# Patient Record
Sex: Male | Born: 1940 | Race: White | Hispanic: No | Marital: Married | Smoking: Never smoker
Health system: Southern US, Community
[De-identification: ages and names within clinical notes are randomized; demographics above are authoritative.]

## PROBLEM LIST (undated history)

## (undated) DIAGNOSIS — C801 Malignant (primary) neoplasm, unspecified: Secondary | ICD-10-CM

## (undated) DIAGNOSIS — M1990 Unspecified osteoarthritis, unspecified site: Secondary | ICD-10-CM

## (undated) DIAGNOSIS — R002 Palpitations: Secondary | ICD-10-CM

## (undated) DIAGNOSIS — K859 Acute pancreatitis without necrosis or infection, unspecified: Secondary | ICD-10-CM

## (undated) DIAGNOSIS — I10 Essential (primary) hypertension: Secondary | ICD-10-CM

## (undated) DIAGNOSIS — J189 Pneumonia, unspecified organism: Secondary | ICD-10-CM

## (undated) HISTORY — PX: SALIVARY GLAND SURGERY: SHX768

## (undated) HISTORY — PX: TONSILLECTOMY: SUR1361

## (undated) HISTORY — DX: Essential (primary) hypertension: I10

## (undated) HISTORY — DX: Acute pancreatitis without necrosis or infection, unspecified: K85.90

## (undated) HISTORY — PX: KNEE SURGERY: SHX244

## (undated) HISTORY — DX: Palpitations: R00.2

---
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
## 2002-03-23 ENCOUNTER — Emergency Department (HOSPITAL_COMMUNITY): Admission: EM | Admit: 2002-03-23 | Discharge: 2002-03-23 | Payer: Self-pay | Admitting: Emergency Medicine

## 2002-03-23 ENCOUNTER — Encounter: Payer: Self-pay | Admitting: Emergency Medicine

## 2002-08-11 ENCOUNTER — Encounter: Payer: Self-pay | Admitting: Internal Medicine

## 2003-05-04 ENCOUNTER — Encounter: Payer: Self-pay | Admitting: Internal Medicine

## 2003-05-04 ENCOUNTER — Ambulatory Visit (HOSPITAL_COMMUNITY): Admission: RE | Admit: 2003-05-04 | Discharge: 2003-05-04 | Payer: Self-pay | Admitting: Gastroenterology

## 2003-05-04 ENCOUNTER — Encounter (INDEPENDENT_AMBULATORY_CARE_PROVIDER_SITE_OTHER): Payer: Self-pay | Admitting: *Deleted

## 2004-10-20 ENCOUNTER — Ambulatory Visit: Payer: Self-pay | Admitting: Internal Medicine

## 2005-01-25 ENCOUNTER — Emergency Department (HOSPITAL_COMMUNITY): Admission: EM | Admit: 2005-01-25 | Discharge: 2005-01-25 | Payer: Self-pay | Admitting: Emergency Medicine

## 2005-01-27 ENCOUNTER — Ambulatory Visit: Payer: Self-pay | Admitting: Internal Medicine

## 2005-03-10 ENCOUNTER — Ambulatory Visit: Payer: Self-pay | Admitting: Internal Medicine

## 2005-04-15 ENCOUNTER — Ambulatory Visit: Payer: Self-pay | Admitting: Internal Medicine

## 2005-04-16 ENCOUNTER — Ambulatory Visit: Payer: Self-pay | Admitting: Cardiology

## 2005-09-07 ENCOUNTER — Ambulatory Visit: Payer: Self-pay | Admitting: Internal Medicine

## 2005-09-15 ENCOUNTER — Ambulatory Visit: Payer: Self-pay | Admitting: Internal Medicine

## 2005-09-23 ENCOUNTER — Ambulatory Visit: Payer: Self-pay | Admitting: Internal Medicine

## 2005-10-09 ENCOUNTER — Ambulatory Visit: Payer: Self-pay | Admitting: Internal Medicine

## 2005-10-23 ENCOUNTER — Ambulatory Visit: Payer: Self-pay | Admitting: Internal Medicine

## 2006-02-22 ENCOUNTER — Ambulatory Visit: Payer: Self-pay | Admitting: Internal Medicine

## 2006-07-05 ENCOUNTER — Ambulatory Visit: Payer: Self-pay | Admitting: Internal Medicine

## 2006-07-06 ENCOUNTER — Encounter: Admission: RE | Admit: 2006-07-06 | Discharge: 2006-07-06 | Payer: Self-pay | Admitting: Internal Medicine

## 2006-08-25 ENCOUNTER — Ambulatory Visit: Payer: Self-pay | Admitting: Internal Medicine

## 2006-09-08 ENCOUNTER — Encounter: Admission: RE | Admit: 2006-09-08 | Discharge: 2006-09-08 | Payer: Self-pay | Admitting: Internal Medicine

## 2006-10-18 ENCOUNTER — Encounter: Admission: RE | Admit: 2006-10-18 | Discharge: 2006-11-12 | Payer: Self-pay | Admitting: Internal Medicine

## 2006-10-18 ENCOUNTER — Encounter: Payer: Self-pay | Admitting: Internal Medicine

## 2006-11-08 ENCOUNTER — Ambulatory Visit: Payer: Self-pay | Admitting: Internal Medicine

## 2006-11-11 ENCOUNTER — Ambulatory Visit: Payer: Self-pay | Admitting: Internal Medicine

## 2006-11-11 LAB — CONVERTED CEMR LAB
ALT: 31 units/L (ref 0–40)
AST: 20 units/L (ref 0–37)
Albumin: 3.8 g/dL (ref 3.5–5.2)
Alkaline Phosphatase: 48 units/L (ref 39–117)
BUN: 17 mg/dL (ref 6–23)
Basophils Absolute: 0.1 10*3/uL (ref 0.0–0.1)
Basophils Relative: 0.9 % (ref 0.0–1.0)
CO2: 31 meq/L (ref 19–32)
Calcium: 9.3 mg/dL (ref 8.4–10.5)
Chloride: 102 meq/L (ref 96–112)
Chol/HDL Ratio, serum: 3.9
Cholesterol: 194 mg/dL (ref 0–200)
Creatinine, Ser: 1 mg/dL (ref 0.4–1.5)
Eosinophil percent: 2.7 % (ref 0.0–5.0)
GFR calc non Af Amer: 80 mL/min
Glomerular Filtration Rate, Af Am: 97 mL/min/{1.73_m2}
Glucose, Bld: 108 mg/dL — ABNORMAL HIGH (ref 70–99)
HCT: 43.8 % (ref 39.0–52.0)
HDL: 50.3 mg/dL (ref >39.0)
Hemoglobin: 15.3 g/dL (ref 13.0–17.0)
LDL Cholesterol: 127 mg/dL — ABNORMAL HIGH (ref 0–99)
Lymphocytes Relative: 25.3 % (ref 12.0–46.0)
MCHC: 35.1 g/dL (ref 30.0–36.0)
MCV: 87.4 fL (ref 78.0–100.0)
Monocytes Absolute: 0.6 10*3/uL (ref 0.2–0.7)
Monocytes Relative: 8.5 % (ref 3.0–11.0)
Neutro Abs: 4.6 10*3/uL (ref 1.4–7.7)
Neutrophils Relative %: 62.6 % (ref 43.0–77.0)
PSA: 1.11 ng/mL (ref 0.10–4.00)
Platelets: 264 10*3/uL (ref 150–400)
Potassium: 4 meq/L (ref 3.5–5.1)
RBC: 5.01 M/uL (ref 4.22–5.81)
RDW: 12.6 % (ref 11.5–14.6)
Sodium: 139 meq/L (ref 135–145)
TSH: 2.03 microintl units/mL (ref 0.35–5.50)
Total Bilirubin: 0.8 mg/dL (ref 0.3–1.2)
Total Protein: 6.6 g/dL (ref 6.0–8.3)
Triglyceride fasting, serum: 82 mg/dL (ref 0–149)
VLDL: 16 mg/dL (ref 0–40)
WBC: 7.3 10*3/uL (ref 4.5–10.5)

## 2006-11-18 ENCOUNTER — Ambulatory Visit: Payer: Self-pay | Admitting: Internal Medicine

## 2007-12-05 ENCOUNTER — Encounter: Payer: Self-pay | Admitting: Internal Medicine

## 2007-12-06 ENCOUNTER — Telehealth: Payer: Self-pay | Admitting: Internal Medicine

## 2009-12-04 ENCOUNTER — Ambulatory Visit: Payer: Self-pay | Admitting: Internal Medicine

## 2009-12-04 DIAGNOSIS — E119 Type 2 diabetes mellitus without complications: Secondary | ICD-10-CM

## 2009-12-04 DIAGNOSIS — I10 Essential (primary) hypertension: Secondary | ICD-10-CM | POA: Insufficient documentation

## 2010-03-25 ENCOUNTER — Encounter: Payer: Self-pay | Admitting: Internal Medicine

## 2010-06-07 ENCOUNTER — Emergency Department (HOSPITAL_COMMUNITY): Admission: EM | Admit: 2010-06-07 | Discharge: 2010-06-07 | Payer: Self-pay | Admitting: Emergency Medicine

## 2010-06-07 ENCOUNTER — Emergency Department (HOSPITAL_COMMUNITY): Admission: EM | Admit: 2010-06-07 | Discharge: 2010-06-07 | Payer: Self-pay | Admitting: Family Medicine

## 2010-06-10 ENCOUNTER — Ambulatory Visit: Payer: Self-pay | Admitting: Internal Medicine

## 2010-06-10 DIAGNOSIS — R002 Palpitations: Secondary | ICD-10-CM | POA: Insufficient documentation

## 2010-06-10 HISTORY — DX: Palpitations: R00.2

## 2010-06-11 ENCOUNTER — Telehealth: Payer: Self-pay | Admitting: Internal Medicine

## 2010-06-20 ENCOUNTER — Ambulatory Visit: Payer: Self-pay | Admitting: Cardiology

## 2010-06-20 ENCOUNTER — Ambulatory Visit: Payer: Self-pay | Admitting: Internal Medicine

## 2010-06-20 ENCOUNTER — Telehealth: Payer: Self-pay | Admitting: *Deleted

## 2010-06-20 ENCOUNTER — Ambulatory Visit: Payer: Self-pay

## 2010-06-20 ENCOUNTER — Ambulatory Visit (HOSPITAL_COMMUNITY): Admission: RE | Admit: 2010-06-20 | Discharge: 2010-06-20 | Payer: Self-pay | Admitting: Internal Medicine

## 2010-06-20 ENCOUNTER — Encounter: Payer: Self-pay | Admitting: Internal Medicine

## 2010-06-30 ENCOUNTER — Encounter: Payer: Self-pay | Admitting: Internal Medicine

## 2010-09-12 ENCOUNTER — Encounter: Payer: Self-pay | Admitting: Internal Medicine

## 2010-12-07 LAB — CONVERTED CEMR LAB
Bilirubin, Direct: 0 mg/dL (ref 0.0–0.3)
Calcium: 9.3 mg/dL (ref 8.4–10.5)
Creatinine, Ser: 0.9 mg/dL (ref 0.4–1.5)
HDL: 56.4 mg/dL (ref >39.00)
Hgb A1c MFr Bld: 6.3 % (ref 4.6–6.5)
LDL Cholesterol: 100 mg/dL — ABNORMAL HIGH (ref 0–99)
PSA: 1.33 ng/mL (ref 0.10–4.00)
Total Bilirubin: 0.7 mg/dL (ref 0.3–1.2)
Total CHOL/HDL Ratio: 3
Total Protein: 6.7 g/dL (ref 6.0–8.3)
Triglycerides: 100 mg/dL (ref 0.0–149.0)

## 2010-12-11 NOTE — Procedures (Signed)
Summary: [facility]  [facility]   Imported By: Maryln Gottron 12/05/2009 15:20:00  _____________________________________________________________________  External Attachment:    Type:   Image     Comment:   External Document

## 2010-12-11 NOTE — Miscellaneous (Signed)
Summary: Immunization Entry   Immunization History:  Influenza Immunization History:    Influenza:  historical (09/10/2010)

## 2010-12-11 NOTE — Assessment & Plan Note (Signed)
Summary: PT WILL COME IN FASTING (CPX) // RS/pt rsc/cjr   Vital Signs:  Patient profile:   70 year old male Height:      68 inches Weight:      209 pounds BMI:     31.89 Pulse rate:   72 / minute Resp:     12 per minute BP sitting:   112 / 68  (left arm)  Vitals Entered By: Gladis Riffle, RN (December 04, 2009 8:06 AM)   History of Present Illness: Here for annual medicare visit  in additiion he has multiple medical problems  Htn---tolerating meds without difficulty  DM---relatively new diagnosis. initially started with weight loss---has gained weight since  Preventive Screening-Counseling & Management  Alcohol-Tobacco     Smoking Status: never  Current Medications (verified): 1)  Aspirin Ec 81 Mg Tbec (Aspirin) .... Take 1 Tablet By Mouth Once A Day 2)  Ativan 0.5 Mg Tabs (Lorazepam) .... Take 1 Tablet By Mouth Once A Day As Needed 3)  Benazepril-Hydrochlorothiazide 20-25 Mg Tabs (Benazepril-Hydrochlorothiazide) .... Take 1 Tablet By Mouth Once A Day 4)  Felodipine 5 Mg Tb24 (Felodipine) .... Take 1 Tablet By Mouth Once A Day 5)  Metformin Hcl 500 Mg Tabs (Metformin Hcl) .... Take 1 Tablet By Mouth Once A Day 6)  Onetouch Ultra Test  Strp (Glucose Blood) .... 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 Gladis Riffle, RN (December 04, 2009 8:10 AM)  Flu Vaccine Consent Questions     Do you have a history of severe allergic reactions to this vaccine? no    Any prior history of allergic reactions to egg and/or gelatin? no    Do you have a sensitivity to the preservative Thimersol? no    Do you have a past  history of Guillan-Barre Syndrome? no    Do you currently have an acute febrile illness? no    Have you ever had a severe reaction to latex? no    Vaccine information given and explained to patient? yes    Are you currently pregnant? no    Lot Number:[id number]   Exp Date:05/08/2010   Site Given  Left Deltoid IM   Past History:  Past Medical History: Diabetes mellitus, type II Hypertension  Social History: Smoking Status:  never  Physical Exam  General:  alert and well-developed.   Head:  normocephalic and atraumatic.   Eyes:  pupils equal and pupils round.   Ears:  R ear normal and no external deformities.   Neck:  No deformities, masses, or tenderness noted. Chest Wall:  No deformities, masses, tenderness or gynecomastia noted. Lungs:  normal respiratory effort and no intercostal retractions.   Heart:  normal rate and regular rhythm.   Abdomen:  soft and non-tender.   overweight Rectal:  No external abnormalities noted. Normal sphincter tone. No rectal masses or tenderness. Prostate:  no nodules and no asymmetry.   Msk:  No deformity or scoliosis noted of thoracic or lumbar spine.   Pulses:  R radial normal and L radial normal.   Neurologic:  cranial nerves II-XII intact and gait normal.   Skin:  turgor normal and color normal.  1 cm irregular mole---scaley, red, under left breast Cervical Nodes:  no anterior cervical adenopathy and no posterior cervical adenopathy.   Psych:  memory intact for recent and remote and good eye contact.    Diabetes Management Exam:    Eye Exam:       Eye Exam done elsewhere          Date: 11/09/2009          Results: normal-pt's report          Done by: ophtha   Impression & Recommendations:  Problem # 1:  DIABETES MELLITUS, TYPE II (ICD-250.00)  His updated medication list for this problem includes:    Aspirin Ec 81 Mg Tbec (Aspirin) .Marland Kitchen... Take 1 tablet by mouth once a day    Benazepril-hydrochlorothiazide 20-25 Mg Tabs  (Benazepril-hydrochlorothiazide) .Marland Kitchen... Take 1 tablet by mouth once a day    Metformin Hcl 500 Mg Tabs (Metformin hcl) .Marland Kitchen... Take 1 tablet by mouth once a day  Orders: EKG w/ Interpretation (93000) Venipuncture (29528) TLB-Lipid Panel (80061-LIPID) TLB-BMP (Basic Metabolic Panel-BMET) (80048-METABOL) TLB-Hepatic/Liver Function Pnl (80076-HEPATIC) TLB-TSH (Thyroid Stimulating Hormone) (84443-TSH) TLB-A1C / Hgb A1C (Glycohemoglobin) (83036-A1C) Prescription Created Electronically [phone])  Problem # 2:  HYPERTENSION (ICD-401.9)  His updated medication list for this problem includes:    Benazepril-hydrochlorothiazide 20-25 Mg Tabs (Benazepril-hydrochlorothiazide) .Marland Kitchen... Take 1 tablet by mouth once a day    Felodipine 5 Mg Tb24 (Felodipine) .Marland Kitchen... Take 1 tablet by mouth once a day  Orders: EKG w/ Interpretation (93000) Venipuncture (40102) TLB-Lipid Panel (80061-LIPID) TLB-BMP (Basic Metabolic Panel-BMET) (80048-METABOL) TLB-Hepatic/Liver Function Pnl (80076-HEPATIC) TLB-TSH (Thyroid Stimulating Hormone) (84443-TSH) TLB-A1C / Hgb A1C (Glycohemoglobin) (83036-A1C)  Problem # 3:  Preventive Health Care (ICD-V70.0)  Orders: TLB-PSA (Prostate Specific Antigen) (84153-PSA) EMR Misc Charge Code [facility])  Problem # 4:  RASH-NONVESICULAR (ICD-782.1)  His updated medication list for this problem includes:    Triamcinolone Acetonide 0.5 % Crea (Triamcinolone acetonide) .Marland Kitchen... Apply bid to affected area  Complete Medication List: 1)  Aspirin Ec 81 Mg Tbec (Aspirin) .... Take 1 tablet by mouth once a day 2)  Ativan 0.5 Mg Tabs (Lorazepam) .... Take 1 tablet by mouth once a day as needed 3)  Benazepril-hydrochlorothiazide 20-25 Mg Tabs (Benazepril-hydrochlorothiazide) .... Take 1 tablet by mouth once a day 4)  Felodipine 5 Mg Tb24 (Felodipine) .... Take 1 tablet by mouth once a day 5)  Metformin Hcl 500 Mg Tabs (Metformin hcl) .... Take 1 tablet by mouth once a day 6)  Onetouch Ultra Test  Strp (Glucose blood) .... Use every other day as directed 7)  Triamcinolone Acetonide 0.5 % Crea (Triamcinolone acetonide) .... Apply bid to affected area  Other Orders: Admin 1st Vaccine (72536) Flu Vaccine 66yrs + (64403) Pneumococcal Vaccine (47425) Admin of Any Addtl Vaccine (95638)  Patient Instructions: 1)  Please schedule a follow-up appointment in 4 months. 2)  labs one week prior to visit 3)  lipids---272.4 4)  lfts-995.2 5)  bmet-995.2 6)  A1C-250.02 7)     Prescriptions: TRIAMCINOLONE ACETONIDE 0.5 % CREA (TRIAMCINOLONE ACETONIDE) apply bid to affected area  #15 grams x 1   Entered and Authorized by:   Birdie Sons MD   Signed by:   Birdie Sons MD on 12/04/2009   Method used:   Electronically to        [facility]  [phone]* (retail)       [address] [place], [place]  [address]  Ph: [phone] or [phone]       Fax: [phone]   RxID:   [id number] METFORMIN HCL 500 MG TABS (METFORMIN HCL) Take 1 tablet by mouth once a day  #90 x 3   Entered and Authorized by:   Birdie Sons MD   Signed by:   Birdie Sons MD on 12/04/2009   Method used:   Electronically to        [facility]  [phone]* (retail)       [address] [place], [place]  [address]       Ph: [phone] or [phone]       Fax: [phone]   RxID:   [id number] FELODIPINE 5 MG TB24 (FELODIPINE) Take 1 tablet by mouth once a day  #90 x 3   Entered and Authorized by:   Birdie Sons MD   Signed by:   Birdie Sons MD on 12/04/2009   Method used:   Electronically to        [facility]  [phone]* (retail)       [address] [place], [place]  [address]       Ph: [phone] or [phone]       Fax: [phone]   RxID:   [id number] BENAZEPRIL-HYDROCHLOROTHIAZIDE 20-25 MG TABS (BENAZEPRIL-HYDROCHLOROTHIAZIDE) Take 1 tablet by mouth once a day  #90 x 3   Entered and Authorized by:   Birdie Sons MD   Signed by:   Birdie Sons MD on  12/04/2009   Method used:   Electronically to        [facility]  [phone]* (retail)       [address] [place], [place]  [address]       Ph: [phone] or [phone]       Fax: [phone]   RxID:   [id number]    Immunization History:  Tetanus/Td Immunization History:    Tetanus/Td:  historical (11/09/2006)  Immunizations Administered:  Pneumonia Vaccine:    Vaccine Type: Pneumovax    Site: right deltoid    Mfr: Merck    Dose: 0.5 ml    Route: IM    Given by: Gladis Riffle, RN    Exp. Date: 12/05/2010    Lot #: [id number]   Preventive Care Screening  Colonoscopy:    Date:  11/09/2005    Next Due:  11/2015    Results:  normal    Prevention & Chronic Care Immunizations   Influenza vaccine: Fluvax 3+  (12/04/2009)    Tetanus booster: 11/09/2006: Historical    Pneumococcal vaccine: Pneumovax  (12/04/2009)    H. zoster vaccine: Not documented  Colorectal Screening   Hemoccult: Not documented    Colonoscopy: normal  (11/09/2005)   Colonoscopy due: 11/2015  Other Screening   PSA: 1.11  (11/11/2006)   PSA ordered.   Smoking status: never  (12/04/2009)  Diabetes Mellitus   HgbA1C: Not documented    Eye exam: normal-pt's report  (11/09/2009)   Eye exam due: 11/2010    Foot exam: Not documented   High risk foot: Not documented   Foot care education: Not documented    Urine microalbumin/creatinine ratio: Not documented    Diabetes flowsheet reviewed?: Yes   Progress toward A1C goal: At goal    Stage of readiness to change (diabetes management): Action   Diabetes comments: previous care in [place]  Lipids   Total Cholesterol:  194  (11/11/2006)   LDL: 127  (11/11/2006)   LDL Direct: Not documented   HDL: 50.3  (11/11/2006)   Triglycerides: Not documented  Hypertension   Last Blood Pressure: 112 / 68  (12/04/2009)   Serum creatinine: 1.0  (11/11/2006)   Serum potassium 4.0  (11/11/2006)  Self-Management Support :    Diabetes  self-management support: Not documented    Hypertension self-management support: Not documented   Nursing Instructions: Give Flu vaccine today Give Pneumovax today     Preventive Care Screening  Colonoscopy:    Date:  11/09/2005    Next Due:  11/2015    Results:  normal

## 2010-12-11 NOTE — Assessment & Plan Note (Signed)
Summary: POST ED F/U - PALPITATIONS // RS   Vital Signs:  Patient profile:   70 year old male Weight:      216 pounds Temp:     98.5 degrees F oral Pulse rate:   88 / minute Pulse rhythm:   regular Resp:     12 per minute BP sitting:   132 / 76  (left arm) Cuff size:   regular  Vitals Entered By: Gladis Riffle, RN (June 10, 2010 9:01 AM) CC: FU palpitations--went to ER 06/07/10, told needs Holter monitor--had some anxiety last night--BP 135/85 at home Is Patient Diabetic? Yes Did you bring your meter with you today? No Comments CBGs 120-135 at home   CC:  FU palpitations--went to ER 06/07/10 and told needs Holter monitor--had some anxiety last night--BP 135/85 at home.  History of Present Illness: Long hx of intermittent palpitations over the weekend had several "flips" in my heart. no associated CP, SOB, or other associated sxs denies rapid heart beat. at the same time of the unusual beat he had an unusual sensation of the need to cough. no otc meds, no significant change in caffeine intake.   All other systems reviewed and were negative   Preventive Screening-Counseling & Management  Alcohol-Tobacco     Smoking Status: never  Current Problems (verified): 1)  Preventive Health Care  (ICD-V70.0) 2)  Hypertension  (ICD-401.9) 3)  Diabetes Mellitus, Type II  (ICD-250.00)  Current Medications (verified): 1)  Aspirin Ec 81 Mg Tbec (Aspirin) .... Take 1 Tablet By Mouth Once A Day 2)  Ativan 0.5 Mg Tabs (Lorazepam) .... Take 1 Tablet By Mouth Once A Day As Needed 3)  Benazepril-Hydrochlorothiazide 20-25 Mg Tabs (Benazepril-Hydrochlorothiazide) .... Take 1 Tablet By Mouth Once A Day 4)  Felodipine 5 Mg Tb24 (Felodipine) .... Take 1 Tablet By Mouth Once A Day 5)  Metformin Hcl 500 Mg Tabs (Metformin Hcl) .... Take 1 Tablet By Mouth Once A Day 6)  Onetouch Ultra Test  Strp (Glucose Blood) .... Use Every Other Day As Directed 7)  Triamcinolone Acetonide 0.5 % Crea (Triamcinolone  Acetonide) .... Apply Bid To Affected Area 8)  Lamisil 250 Mg Tabs (Terbinafine Hcl) .... Take 1 Tablet By Mouth Once A Day  Allergies (verified): No Known Drug Allergies  Past History:  Past Medical History: Last updated: 12/04/2009 Diabetes mellitus, type II Hypertension  Social History: Last updated: 06/10/2010 working again as interim superintendent---[employer]  Risk Factors: Smoking Status: never (06/10/2010)  Social History: working again as interim superintendent---[employer]  Physical Exam  General:  alert and well-developed.   Head:  normocephalic and atraumatic.   Eyes:  pupils equal and pupils round.   Ears:  R ear normal and L ear normal.   Neck:  No deformities, masses, or tenderness noted. Chest Wall:  No deformities, masses, tenderness or gynecomastia noted. Lungs:  normal respiratory effort and no intercostal retractions.   Heart:  normal rate and regular rhythm.     Impression & Recommendations:  Problem # 1:  PALPITATIONS (ICD-785.1)  ? cause holter echo  Orders: Venipuncture (65784) TLB-TSH (Thyroid Stimulating Hormone) (69629-BMW) Cardiology Referral (Cardiology) Echo Referral (Echo)  Complete Medication List: 1)  Aspirin Ec 81 Mg Tbec (Aspirin) .... Take 1 tablet by mouth once a day 2)  Ativan 0.5 Mg Tabs (Lorazepam) .... Take 1 tablet by mouth once a day as needed 3)  Benazepril-hydrochlorothiazide 20-25 Mg Tabs (Benazepril-hydrochlorothiazide) .... Take 1 tablet by mouth once a  day 4)  Felodipine 5 Mg Tb24 (Felodipine) .... Take 1 tablet by mouth once a day 5)  Metformin Hcl 500 Mg Tabs (Metformin hcl) .... Take 1 tablet by mouth once a day 6)  Onetouch Ultra Test Strp (Glucose blood) .... Use every other day as directed 7)  Triamcinolone Acetonide 0.5 % Crea (Triamcinolone acetonide) .... Apply bid to affected area 8)  Lamisil 250 Mg Tabs (Terbinafine hcl) .... Take 1 tablet by mouth once a day  Appended Document: Orders  Update    Clinical Lists Changes  Orders: Added new Test order of TLB-TSH (Thyroid Stimulating Hormone) [phone]) - Signed

## 2010-12-11 NOTE — Progress Notes (Signed)
Summary: lab results  Phone Note Call from Patient Call back at [phone]   Caller: Patient Summary of Call: Pt would like to have tsh results. Initial call taken by: Romualdo Bolk, CMA Duncan Dull),  June 20, 2010 8:17 AM  Follow-up for Phone Call        ok results normal Follow-up by: Birdie Sons MD,  June 20, 2010 8:54 AM  Additional Follow-up for Phone Call Additional follow up Details #1::        Left message for pt to call back Additional Follow-up by: Romualdo Bolk, CMA Duncan Dull),  June 20, 2010 9:57 AM    Additional Follow-up for Phone Call Additional follow up Details #2::    Pt aware of results. Follow-up by: Romualdo Bolk, CMA Duncan Dull),  June 20, 2010 11:39 AM

## 2010-12-11 NOTE — Procedures (Signed)
Summary: summary report  summary report   Imported By: Mirna Mires 06/30/2010 10:55:48  _____________________________________________________________________  External Attachment:    Type:   Image     Comment:   External Document  Appended Document: summary report call patient. holter monitor results look normal a few early beats (normal) no need for further eval if he is not comfortable with that approach refer to cardiology  Appended Document: summary report Left message on machine. Pt to call back.   Appended Document: summary report Left message on machine. Pt to call back.   Appended Document: summary report Patient notified. He is satisfied and does not need to see cardiology at this time.

## 2010-12-11 NOTE — Progress Notes (Signed)
Summary: appt number  Phone Note Call from Patient   Caller: Patient Call For: Birdie Sons MD Summary of Call: [phone] Please call this number with pt's Echo appt. Initial call taken by: Lynann Beaver CMA,  June 11, 2010 10:12 AM  Follow-up for Phone Call        Appt Scheduled.  Pt made aware. Follow-up by: Corky Mull,  June 11, 2010 10:31 AM     Appended Document: Orders Update    Clinical Lists Changes  Orders: Added new Service order of Venipuncture (47829) - Signed Added new Service order of Specimen Handling (56213) - Signed

## 2010-12-12 NOTE — Procedures (Signed)
Summary: Colonoscopy, [facility]  Colonoscopy, [facility]   Imported By: Maryln Gottron 04/23/2010 13:43:29  _____________________________________________________________________  External Attachment:    Type:   Image     Comment:   External Document

## 2010-12-12 NOTE — Procedures (Signed)
Summary: Summary Report  Summary Report   Imported By: Erle Crocker 07/09/2010 10:43:06  _____________________________________________________________________  External Attachment:    Type:   Image     Comment:   External Document

## 2011-01-08 ENCOUNTER — Other Ambulatory Visit: Payer: Self-pay | Admitting: Internal Medicine

## 2011-01-24 LAB — CK TOTAL AND CKMB (NOT AT ARMC)
CK, MB: 2.7 ng/mL (ref 0.3–4.0)
Total CK: 84 U/L (ref 7–232)

## 2011-01-24 LAB — POCT I-STAT, CHEM 8
Creatinine, Ser: 0.9 mg/dL (ref 0.4–1.5)
HCT: 46 % (ref 39.0–52.0)
Hemoglobin: 15.6 g/dL (ref 13.0–17.0)
Potassium: 3.7 mEq/L (ref 3.5–5.1)
Sodium: 139 mEq/L (ref 135–145)
TCO2: 25 mmol/L (ref 0–100)

## 2011-03-16 ENCOUNTER — Encounter: Payer: Self-pay | Admitting: Internal Medicine

## 2011-03-17 ENCOUNTER — Encounter: Payer: Self-pay | Admitting: Internal Medicine

## 2011-03-17 ENCOUNTER — Ambulatory Visit (INDEPENDENT_AMBULATORY_CARE_PROVIDER_SITE_OTHER): Payer: Medicare Other | Admitting: Internal Medicine

## 2011-03-17 DIAGNOSIS — R002 Palpitations: Secondary | ICD-10-CM

## 2011-03-17 DIAGNOSIS — E119 Type 2 diabetes mellitus without complications: Secondary | ICD-10-CM

## 2011-03-17 DIAGNOSIS — I10 Essential (primary) hypertension: Secondary | ICD-10-CM

## 2011-03-17 LAB — HEPATIC FUNCTION PANEL
ALT: 34 U/L (ref 0–53)
AST: 24 U/L (ref 0–37)
Alkaline Phosphatase: 50 U/L (ref 39–117)
Bilirubin, Direct: 0.1 mg/dL (ref 0.0–0.3)
Total Bilirubin: 0.6 mg/dL (ref 0.3–1.2)

## 2011-03-17 LAB — BASIC METABOLIC PANEL
BUN: 16 mg/dL (ref 6–23)
Chloride: 105 mEq/L (ref 96–112)
GFR: 96.44 mL/min (ref >60.00)
Potassium: 4.1 mEq/L (ref 3.5–5.1)
Sodium: 141 mEq/L (ref 135–145)

## 2011-03-17 LAB — LIPID PANEL
HDL: 48.6 mg/dL (ref >39.00)
LDL Cholesterol: 118 mg/dL — ABNORMAL HIGH (ref 0–99)
Total CHOL/HDL Ratio: 4
VLDL: 12.4 mg/dL (ref 0.0–40.0)

## 2011-03-17 NOTE — Progress Notes (Signed)
  Subjective:    Patient ID: Walter Campbell, male    DOB: December 01, 1940, 70 y.o.   MRN: [id number]  HPI   patient comes in for followup of multiple medical problems including type 2 diabetes, , hypertension. The patient does not check blood sugar or blood pressure at home. The patetient does not follow an exercise or diet program. The patient denies any polyuria, polydipsia.  In the past the patient has gone to diabetic treatment center. The patient is tolerating medications  Without difficulty. The patient does admit to medication compliance.   Past Medical History  Diagnosis Date  . Diabetes mellitus   . Hypertension   . Palpitations 06/10/2010   No past surgical history on file.  reports that he has never smoked. He does not have any smokeless tobacco history on file. He reports that he drinks alcohol. His drug history not on file. family history includes Arthritis in his sister; Dementia in his mother; Diabetes in his mother; Heart disease in his mother; Hyperlipidemia in his father; Hyperthyroidism in his sister; Learning disabilities in his paternal grandmother; Osteoporosis in his mother; and Stroke in his mother. No Known Allergies   Review of Systems  patient denies chest pain, shortness of breath, orthopnea. Denies lower extremity edema, abdominal pain, change in appetite, change in bowel movements. Patient denies rashes, musculoskeletal complaints. No other specific complaints in a complete review of systems.      Objective:   Physical Exam   well-developed well-nourished male in no acute distress. HEENT exam atraumatic, normocephalic, neck supple without jugular venous distention. Chest clear to auscultation cardiac exam S1-S2 are regular. Abdominal exam overweight with bowel sounds, soft and nontender. Extremities no edema. Neurologic exam is alert with a normal gait.      Assessment & Plan:

## 2011-03-17 NOTE — Assessment & Plan Note (Signed)
No recurrence. 

## 2011-03-17 NOTE — Assessment & Plan Note (Signed)
Home CBGs--100-120 Needs f/u

## 2011-03-17 NOTE — Assessment & Plan Note (Signed)
Controlled Continue same meds 

## 2011-03-27 NOTE — Op Note (Signed)
   NAME:  Walter Campbell, Walter Campbell NO.:  [id number]   MEDICAL RECORD NO.:  [id number]                   PATIENT TYPE:  AMB   LOCATION:  ENDO                                 FACILITY:  [facility]   PHYSICIAN:  Bernette Redbird, M.D.                DATE OF BIRTH:  June 08, 1941   DATE OF PROCEDURE:  05/04/2003  DATE OF DISCHARGE:                                 OPERATIVE REPORT   PROCEDURE:  Colonoscopy with biopsy.   ENDOSCOPIST:  Bernette Redbird, M.D.   INDICATIONS FOR PROCEDURE:  This is a 70 year old gentleman for screening  colonoscopy.   FINDINGS:  Diminutive cecal polyp.   DESCRIPTION OF PROCEDURE:  The nature, purpose and risks of the procedure  have been previously discussed with the patient who provided written  consent.  Sedation for this procedure and the upper endoscopy, which  proceeded it: Total Fentanyl 120 mcg and Versed 10 mg IV without arrhythmias  or desaturation.   Digital Exam:  The prostate was normal.   The Olympus adult videocolonoscope was advanced to the terminal ileum  without significant difficulty and pullback was then performed.  The  terminal ileum had a normal appearance.  The colon was well prepped and it  was felt that all areas were well seen.  There was a diminutive sessile  polyp in the cecum removed by 1 or 2 cold biopsies.  The remainder of the  colon was unremarkable without evidence of polyps, cancer, colitis, vascular  malformations or diverticulosis.  There was a transiently seen sessile polyp  in the rectum which flattened out with insufflation so it was not biopsied.  It was not felt to be clinically significant.  On retroflexion the rectum,  as well as reinspection of the rectosigmoid, was unremarkable.   The patient tolerated the procedure well and there were no apparent  complications.   IMPRESSION:  Solitary diminutive polyp removed as described above, otherwise  normal screening exam.   PLAN:  Await pathology  with anticipated colonoscopic follow up in 5 years if  the polyp is adenomatous in character, otherwise flexible sigmoidoscopy or a  colonoscopy in 5 years for continued screening.                                               Bernette Redbird, M.D.    RB/MEDQ  D:  05/04/2003  T:  05/05/2003  Job:  [id number]   cc:   Valetta Mole. Swords, M.D. [facility]

## 2011-03-27 NOTE — Op Note (Signed)
NAME:  REMER, COUSE NO.:  [id number]   MEDICAL RECORD NO.:  [id number]                   PATIENT TYPE:  AMB   LOCATION:  ENDO                                 FACILITY:  [facility]   PHYSICIAN:  Bernette Redbird, M.D.                DATE OF BIRTH:  [birth year]   DATE OF PROCEDURE:  05/04/2003  DATE OF DISCHARGE:                                 OPERATIVE REPORT   PROCEDURE:  Upper endoscopy.   INDICATIONS FOR PROCEDURE:  Chronic mild  reflux symptoms in a 70 year old  male.   FINDINGS:  Small hiatal hernia, distal esophageal  ring, mild esophagitis.   DESCRIPTION OF PROCEDURE:  The nature and purposes of the procedure had been  discussed with the patient who provided written consent. Sedation was  Fentanyl 70 micrograms and Versed 7 mg IV without arrhythmias or  desaturation.   The Olympus standard adult video endoscope was passed under direct vision.  The vocal cords were not well seen, but no gross laryngeal abnormalities  were evident. The esophagus was entered without too much difficulty when the  patient swallowed.   In the mid esophagus, there was a small furrowed erythematous area  consistent with perhaps some reflux related inflammation or perhaps a focus  of pill-induced esophageal  inflammation. In the distal esophagus there were  further furrows of erythema with perhaps minimal erosive change, no deep  ulceration, no evidence of Barrett's esophagus. There was a little bit of  distal  esophageal  deformity and some ring like narrowing, not limited to  the squamocolumnar junction, but also involving that. Below that was a small  hiatal hernia. There was no significant stricturing, no difficulty with  passage of the 10-mm endoscope.   The stomach contained a small clear  residual which was suctioned up. The  gastric mucosa was unremarkable without evidence of gastritis, erosions,  ulcers, polyps or masses. A retroflex view of the proximal   stomach  did not  show any obvious patulous character to the diaphragmatic hiatus. The pylorus  looked normal. The duodenal bulb had some edema  and erythema consistent  with duodenitis and the 2nd duodenum looked normal.   The scope was removed from the patient. He tolerated  the procedure well  without apparent complication.   IMPRESSION:  1. Mild active esophagitis as noted above.  2. Chronic changes of esophagitis with slight deformity and ring formation     as noted above.  3.     Small  hiatal hernia.  4. Duodenitis, not felt to be clinically significant.   PLAN:  Consider ongoing proton pump inhibitor  therapy, perhaps over-the-  counter Prilosec 20 mg every other day.  Bernette Redbird, M.D.    RB/MEDQ  D:  05/04/2003  T:  05/05/2003  Job:  [id number]   cc:   Valetta Mole. Swords, M.D. [facility]

## 2011-08-10 ENCOUNTER — Ambulatory Visit (INDEPENDENT_AMBULATORY_CARE_PROVIDER_SITE_OTHER): Payer: Medicare Other

## 2011-08-10 DIAGNOSIS — Z23 Encounter for immunization: Secondary | ICD-10-CM

## 2011-09-08 ENCOUNTER — Other Ambulatory Visit: Payer: Self-pay | Admitting: Internal Medicine

## 2011-09-08 MED ORDER — GLUCOSE BLOOD VI STRP: ORAL_STRIP | Status: DC

## 2011-09-08 NOTE — Telephone Encounter (Signed)
Pt need new rx for test strips for one touch ultra 2 machine call into [facility] [address] [phone]

## 2011-09-08 NOTE — Telephone Encounter (Signed)
rx sent in electronically 

## 2011-09-18 ENCOUNTER — Ambulatory Visit: Payer: Medicare Other | Admitting: Internal Medicine

## 2011-10-15 ENCOUNTER — Encounter: Payer: Self-pay | Admitting: Internal Medicine

## 2011-10-15 ENCOUNTER — Ambulatory Visit (INDEPENDENT_AMBULATORY_CARE_PROVIDER_SITE_OTHER): Payer: Medicare Other | Admitting: Internal Medicine

## 2011-10-15 VITALS — BP 108/70 | HR 80 | Temp 98.4°F | Ht 68.5 in | Wt 220.0 lb

## 2011-10-15 DIAGNOSIS — M67919 Unspecified disorder of synovium and tendon, unspecified shoulder: Secondary | ICD-10-CM | POA: Insufficient documentation

## 2011-10-15 DIAGNOSIS — M719 Bursopathy, unspecified: Secondary | ICD-10-CM

## 2011-10-15 DIAGNOSIS — I10 Essential (primary) hypertension: Secondary | ICD-10-CM

## 2011-10-15 DIAGNOSIS — Z23 Encounter for immunization: Secondary | ICD-10-CM

## 2011-10-15 DIAGNOSIS — R079 Chest pain, unspecified: Secondary | ICD-10-CM | POA: Insufficient documentation

## 2011-10-15 DIAGNOSIS — E119 Type 2 diabetes mellitus without complications: Secondary | ICD-10-CM

## 2011-10-15 LAB — HEPATIC FUNCTION PANEL
ALT: 43 U/L (ref 0–53)
AST: 30 U/L (ref 0–37)
Bilirubin, Direct: 0 mg/dL (ref 0.0–0.3)
Total Bilirubin: 0.6 mg/dL (ref 0.3–1.2)

## 2011-10-15 LAB — BASIC METABOLIC PANEL
BUN: 19 mg/dL (ref 6–23)
Creatinine, Ser: 0.9 mg/dL (ref 0.4–1.5)
GFR: 85.6 mL/min (ref >60.00)
Potassium: 3.7 mEq/L (ref 3.5–5.1)

## 2011-10-15 LAB — HEMOGLOBIN A1C: Hgb A1c MFr Bld: 6.6 % — ABNORMAL HIGH (ref 4.6–6.5)

## 2011-10-15 NOTE — Assessment & Plan Note (Signed)
Multiple risk factors- Check EKG,  Needs stress test

## 2011-10-15 NOTE — Assessment & Plan Note (Signed)
Suspect RTC tendonitis---has limited ROM with internal and external rotation  PT

## 2011-10-15 NOTE — Progress Notes (Signed)
Patient ID: Walter Campbell, male   DOB: Feb 27, 1941, 70 y.o.   MRN: [id number]   patient comes in for followup of multiple medical problems including type 2 diabetes, hyperlipidemia, hypertension. The patient does not check blood sugar or blood pressure at home. The patetient does not follow an exercise or diet program. The patient denies any polyuria, polydipsia.  In the past the patient has gone to diabetic treatment center. The patient is tolerating medications  Without difficulty. The patient does admit to medication compliance.   Past Medical History  Diagnosis Date  . Diabetes mellitus   . Hypertension   . Palpitations 06/10/2010    History   Social History  . Marital Status: Single    Spouse Name: N/A    Number of Children: N/A  . Years of Education: N/A   Occupational History  . Not on file.   Social History Main Topics  . Smoking status: Never Smoker   . Smokeless tobacco: Not on file  . Alcohol Use: Yes  . Drug Use: Not on file  . Sexually Active: Not on file   Other Topics Concern  . Not on file   Social History Narrative  . No narrative on file    No past surgical history on file.  Family History  Problem Relation Age of Onset  . Stroke Mother   . Diabetes Mother   . Osteoporosis Mother   . Dementia Mother   . Heart disease Mother     pacemaker  . Hyperlipidemia Father   . Arthritis Sister   . Hyperthyroidism Sister   . Learning disabilities Paternal Grandmother     No Known Allergies  Current Outpatient Prescriptions on File Prior to Visit  Medication Sig Dispense Refill  . aspirin 81 MG tablet Take 81 mg by mouth daily.        . benazepril-hydrochlorthiazide (LOTENSIN HCT) 20-25 MG per tablet TAKE 1 TABLET BY MOUTH EVERY DAY  90 tablet  3  . felodipine (PLENDIL) 5 MG 24 hr tablet EVERY DAY  90 tablet  2  . glucose blood (ONE TOUCH ULTRA TEST) test strip Use as instructed  100 each  12  . metFORMIN (GLUCOPHAGE) 500 MG tablet TAKE 1 TABLET BY MOUTH  EVERY DAY  90 tablet  3     patient denies chest pain, shortness of breath, orthopnea. Denies lower extremity edema, abdominal pain, change in appetite, change in bowel movements. Patient denies rashes, musculoskeletal (except has some left shoulder pain) complaints. No other specific complaints in a complete review of systems.   BP 108/70  Pulse 80  Temp(Src) 98.4 F (36.9 C) (Oral)  Ht 5' 8.5" (1.74 m)  Wt 220 lb (99.791 kg)  BMI 32.96 kg/m2  well-developed well-nourished male in no acute distress. HEENT exam atraumatic, normocephalic, neck supple without jugular venous distention. Chest clear to auscultation cardiac exam S1-S2 are regular. Abdominal exam overweight with bowel sounds, soft and nontender. Extremities no edema. Neurologic exam is alert with a normal gait.

## 2011-10-15 NOTE — Assessment & Plan Note (Signed)
Suspect controlled  but will check labs today

## 2011-10-20 ENCOUNTER — Ambulatory Visit: Payer: Medicare Other | Attending: Internal Medicine

## 2011-10-20 ENCOUNTER — Other Ambulatory Visit: Payer: Self-pay | Admitting: Internal Medicine

## 2011-10-20 DIAGNOSIS — R5381 Other malaise: Secondary | ICD-10-CM | POA: Insufficient documentation

## 2011-10-20 DIAGNOSIS — M25519 Pain in unspecified shoulder: Secondary | ICD-10-CM | POA: Insufficient documentation

## 2011-10-20 DIAGNOSIS — IMO0001 Reserved for inherently not codable concepts without codable children: Secondary | ICD-10-CM | POA: Insufficient documentation

## 2011-10-21 ENCOUNTER — Ambulatory Visit (HOSPITAL_COMMUNITY): Payer: Medicare Other | Attending: Internal Medicine | Admitting: Radiology

## 2011-10-21 DIAGNOSIS — R002 Palpitations: Secondary | ICD-10-CM | POA: Insufficient documentation

## 2011-10-21 DIAGNOSIS — R0789 Other chest pain: Secondary | ICD-10-CM | POA: Insufficient documentation

## 2011-10-21 DIAGNOSIS — I10 Essential (primary) hypertension: Secondary | ICD-10-CM | POA: Insufficient documentation

## 2011-10-21 DIAGNOSIS — R079 Chest pain, unspecified: Secondary | ICD-10-CM

## 2011-10-21 DIAGNOSIS — R5383 Other fatigue: Secondary | ICD-10-CM | POA: Insufficient documentation

## 2011-10-21 DIAGNOSIS — Z8249 Family history of ischemic heart disease and other diseases of the circulatory system: Secondary | ICD-10-CM | POA: Insufficient documentation

## 2011-10-21 DIAGNOSIS — E785 Hyperlipidemia, unspecified: Secondary | ICD-10-CM | POA: Insufficient documentation

## 2011-10-21 DIAGNOSIS — E119 Type 2 diabetes mellitus without complications: Secondary | ICD-10-CM | POA: Insufficient documentation

## 2011-10-21 DIAGNOSIS — I4949 Other premature depolarization: Secondary | ICD-10-CM

## 2011-10-21 DIAGNOSIS — R5381 Other malaise: Secondary | ICD-10-CM | POA: Insufficient documentation

## 2011-10-21 DIAGNOSIS — E669 Obesity, unspecified: Secondary | ICD-10-CM | POA: Insufficient documentation

## 2011-10-21 MED ORDER — TECHNETIUM TC 99M TETROFOSMIN IV KIT
30.0000 | PACK | Freq: Once | INTRAVENOUS | Status: AC | PRN
  Administered 2011-10-21: 30 via INTRAVENOUS

## 2011-10-21 MED ORDER — TECHNETIUM TC 99M TETROFOSMIN IV KIT
10.0000 | PACK | Freq: Once | INTRAVENOUS | Status: AC | PRN
  Administered 2011-10-21: 10 via INTRAVENOUS

## 2011-10-21 NOTE — Progress Notes (Signed)
[facility] SITE 3 NUCLEAR MED [address] [place] [address] [phone]  Cardiology Nuclear Med Study  Walter Campbell is a 70 y.o. male [id number] 10-15-1941   Nuclear Med Background Indication for Stress Test:  Evaluation for Ischemia History:  .10 yrs ago MPS:OK per patient; 8/11 Echo:EF=55-60%, mild LVH Cardiac Risk Factors: Family History - CAD, Hypertension, Lipids, NIDDM and Obesity  Symptoms:  Chest Pain (last episode of chest discomfort was this am, 2/10; none now), Fatigue and Palpitations   Nuclear Pre-Procedure Caffeine/Decaff Intake:  None NPO After: 6 pm   Lungs:  Clear. IV 0.9% NS with Angio Cath:  20g  IV Site: R Hand  IV Started by:  Bonnita Levan, RN  Chest Size (in):  46 Cup Size: n/a  Height: 5\' 9"  (1.753 m)  Weight:  220 lb (99.791 kg)  BMI:  Body mass index is 32.49 kg/(m^2). Tech Comments:  N/A    Nuclear Med Study 1 or 2 day study: 1 day  Stress Test Type:  Stress  Reading MD: Kristeen Miss, MD  Order Authorizing Provider:  Birdie Sons, MD  Resting Radionuclide: Technetium 45m Tetrofosmin  Resting Radionuclide Dose: 11.0 mCi   Stress Radionuclide:  Technetium 39m Tetrofosmin  Stress Radionuclide Dose: 33.0 mCi           Stress Protocol Rest HR: 72 Stress HR: 142  Rest BP: Sitting:140/82  Standing:142/92 Stress BP: 199/80  Exercise Time (min): 7:00 METS: 8.5   Predicted Max HR: 151 bpm % Max HR: 94.04 bpm Rate Pressure Product: 21308   Dose of Adenosine (mg):  n/a Dose of Lexiscan: n/a mg  Dose of Atropine (mg): n/a Dose of Dobutamine: n/a mcg/kg/min (at max HR)  Stress Test Technologist: Smiley Houseman, CMA-N  Nuclear Technologist:  Domenic Polite, CNMT     Rest Procedure:  Myocardial perfusion imaging was performed at rest 45 minutes following the intravenous administration of Technetium 35m Tetrofosmin.  Rest ECG: No acute changes.  Stress Procedure:  The patient exercised for seven minutes on the treadmill  utilizing the Bruce protocol.  The patient stopped due to fatigue and denied any chest pain.  There were no diagnostic ST-T wave changes, only occasional PVC's and PAC's.  Technetium 50m Tetrofosmin was injected at peak exercise and myocardial perfusion imaging was performed after a brief delay.  Stress ECG: No significant change from baseline ECG  QPS Raw Data Images:  Normal; no motion artifact; normal heart/lung ratio. Stress Images:  Normal homogeneous uptake in all areas of the myocardium. Rest Images:  Normal homogeneous uptake in all areas of the myocardium. Subtraction (SDS):  Normal Transient Ischemic Dilatation (Normal <1.22):  0.88 Lung/Heart Ratio (Normal <0.45):  0.31  Quantitative Gated Spect Images QGS EDV:  93 ml QGS ESV:  30 ml QGS cine images:  NL LV Function; NL Wall Motion QGS EF: 68%  Impression Exercise Capacity:  Fair exercise capacity. BP Response:  Hypertensive blood pressure response. Clinical Symptoms:  No chest pain. ECG Impression:  No significant ST segment change suggestive of ischemia. Comparison with Prior Nuclear Study: No previous nuclear study performed  Overall Impression:  Normal stress nuclear study. Mild thinning of the inferior wall - suspect diaphragmatic attenuation. No ischemia.       Truman Hayward 6:23 PM

## 2011-10-22 ENCOUNTER — Ambulatory Visit: Payer: Medicare Other

## 2011-10-25 ENCOUNTER — Other Ambulatory Visit: Payer: Self-pay | Admitting: Internal Medicine

## 2011-10-27 ENCOUNTER — Ambulatory Visit: Payer: Medicare Other

## 2011-10-29 ENCOUNTER — Ambulatory Visit: Payer: Medicare Other

## 2011-11-04 ENCOUNTER — Ambulatory Visit: Payer: Medicare Other | Admitting: Physical Therapy

## 2011-11-11 ENCOUNTER — Ambulatory Visit: Payer: Medicare Other | Attending: Internal Medicine

## 2011-11-11 DIAGNOSIS — R5381 Other malaise: Secondary | ICD-10-CM | POA: Insufficient documentation

## 2011-11-11 DIAGNOSIS — IMO0001 Reserved for inherently not codable concepts without codable children: Secondary | ICD-10-CM | POA: Insufficient documentation

## 2011-11-11 DIAGNOSIS — M25519 Pain in unspecified shoulder: Secondary | ICD-10-CM | POA: Insufficient documentation

## 2011-11-13 ENCOUNTER — Ambulatory Visit: Payer: Medicare Other | Admitting: Physical Therapy

## 2011-11-17 ENCOUNTER — Ambulatory Visit: Payer: Medicare Other

## 2011-11-19 ENCOUNTER — Ambulatory Visit: Payer: Medicare Other | Admitting: Physical Therapy

## 2011-12-25 ENCOUNTER — Ambulatory Visit (INDEPENDENT_AMBULATORY_CARE_PROVIDER_SITE_OTHER): Payer: Medicare Other | Admitting: Family

## 2011-12-25 ENCOUNTER — Encounter: Payer: Self-pay | Admitting: Family

## 2011-12-25 VITALS — BP 122/80 | Temp 99.0°F | Wt 219.0 lb

## 2011-12-25 DIAGNOSIS — R1013 Epigastric pain: Secondary | ICD-10-CM

## 2011-12-25 DIAGNOSIS — K219 Gastro-esophageal reflux disease without esophagitis: Secondary | ICD-10-CM

## 2011-12-25 LAB — CBC WITH DIFFERENTIAL/PLATELET
Basophils Relative: 1 % (ref 0.0–3.0)
Eosinophils Absolute: 0.1 10*3/uL (ref 0.0–0.7)
Hemoglobin: 14.8 g/dL (ref 13.0–17.0)
MCHC: 33.7 g/dL (ref 30.0–36.0)
MCV: 88.5 fl (ref 78.0–100.0)
Monocytes Absolute: 0.5 10*3/uL (ref 0.1–1.0)
Neutro Abs: 5 10*3/uL (ref 1.4–7.7)
RBC: 4.95 Mil/uL (ref 4.22–5.81)
RDW: 13.5 % (ref 11.5–14.6)

## 2011-12-25 LAB — H. PYLORI ANTIBODY, IGG: H Pylori IgG: NEGATIVE

## 2011-12-25 LAB — BASIC METABOLIC PANEL
CO2: 28 mEq/L (ref 19–32)
Chloride: 100 mEq/L (ref 96–112)
Creatinine, Ser: 0.9 mg/dL (ref 0.4–1.5)
Glucose, Bld: 145 mg/dL — ABNORMAL HIGH (ref 70–99)

## 2011-12-25 MED ORDER — ESOMEPRAZOLE MAGNESIUM 40 MG PO CPDR: 40.0000 mg | DELAYED_RELEASE_CAPSULE | Freq: Every day | ORAL | Status: DC

## 2011-12-25 NOTE — Progress Notes (Signed)
Subjective:    Patient ID: Walter Campbell, male    DOB: May 03, 1941, 71 y.o.   MRN: [id number]  HPI Comments: C/o ab fullness/bloating, aching discomfort new onset Mon after consuming fish sandwich at [employer] and a hot dog. Denies nausea, vomiting, diarrhea, constipation, blood in stool, acid reflux, cp, dyspnea. Discomfort is intermittent, nonradiating, center of ab described as aching. Food makes pain better initially. Had colonoscopy 1.5 year ago results were normal. Takes OTC generic acid reducer daily with no relief.      Review of Systems  Constitutional: Negative for fever, chills, diaphoresis, activity change, appetite change, fatigue and unexpected weight change.  Respiratory: Negative.   Cardiovascular: Negative.   Gastrointestinal: Positive for abdominal pain and abdominal distention. Negative for nausea, vomiting, diarrhea, constipation, blood in stool and rectal pain.  Genitourinary: Negative.   Skin: Positive for pallor.  hospital Past Medical History  Diagnosis Date  . Diabetes mellitus   . Hypertension   . Palpitations 06/10/2010    History   Social History  . Marital Status: Single    Spouse Name: N/A    Number of Children: N/A  . Years of Education: N/A   Occupational History  . Not on file.   Social History Main Topics  . Smoking status: Never Smoker   . Smokeless tobacco: Not on file  . Alcohol Use: Yes  . Drug Use: Not on file  . Sexually Active: Not on file   Other Topics Concern  . Not on file   Social History Narrative  . No narrative on file    No past surgical history on file.  Family History  Problem Relation Age of Onset  . Stroke Mother   . Diabetes Mother   . Osteoporosis Mother   . Dementia Mother   . Heart disease Mother     pacemaker  . Hyperlipidemia Father   . Arthritis Sister   . Hyperthyroidism Sister   . Learning disabilities Paternal Grandmother     No Known Allergies  Current Outpatient Prescriptions on File Prior  to Visit  Medication Sig Dispense Refill  . aspirin 81 MG tablet Take 81 mg by mouth daily.        . benazepril-hydrochlorthiazide (LOTENSIN HCT) 20-25 MG per tablet TAKE 1 TABLET BY MOUTH EVERY DAY  90 tablet  3  . felodipine (PLENDIL) 5 MG 24 hr tablet TAKE 1 TABLET EVERY DAY  90 tablet  2  . glucose blood (ONE TOUCH ULTRA TEST) test strip Use as instructed  100 each  12  . metFORMIN (GLUCOPHAGE) 500 MG tablet TAKE 1 TABLET BY MOUTH EVERY DAY  90 tablet  3    BP 122/80  Temp(Src) 99 F (37.2 C) (Oral)  Wt 219 lb (99.338 kg)chart     Objective:   Physical Exam  Constitutional: He is oriented to person, place, and time. He appears well-developed and well-nourished. No distress.  Cardiovascular: Normal rate, regular rhythm, normal heart sounds and intact distal pulses.  Exam reveals no gallop and no friction rub.   No murmur heard. Pulmonary/Chest: Effort normal and breath sounds normal. No respiratory distress. He has no wheezes. He has no rales. He exhibits no tenderness.  Abdominal: Soft. Normal appearance and bowel sounds are normal. He exhibits no distension and no mass. There is no tenderness (umbilicus). There is no rebound and no guarding.  Neurological: He is alert and oriented to person, place, and time.  Skin: Skin is warm and dry. He is not  diaphoretic.  Psychiatric: He has a normal mood and affect.    EKG: WNL  Assessment & Plan:  Assessment: Epigastic Abdominal pain, GERD  Plan: Labs: BMP, CBC, H pylori. Prilosec. EKG. Consume heart health any diet and exercise as tolerated building up to 5 days per week . Teaching handout provided. Instructed to go to ED if experience cp or dyspnea. RTC three days. Prilosec BID.

## 2011-12-25 NOTE — Patient Instructions (Signed)
Diet for GERD or PUD Nutrition therapy can help ease the discomfort of gastroesophageal reflux disease (GERD) and peptic ulcer disease (PUD).  HOME CARE INSTRUCTIONS   Eat your meals slowly, in a relaxed setting.   Eat 5 to 6 small meals per day.   If a food causes distress, stop eating it for a period of time.  FOODS TO AVOID  Coffee, regular or decaffeinated.   Cola beverages, regular or low calorie.   Tea, regular or decaffeinated.   Pepper.   Cocoa.   High fat foods, including meats.   Butter, margarine, hydrogenated oil (trans fats).   Peppermint or spearmint (if you have GERD).   Fruits and vegetables if not tolerated.   Alcohol.   Nicotine (smoking or chewing). This is one of the most potent stimulants to acid production in the gastrointestinal tract.   Any food that seems to aggravate your condition.  If you have questions regarding your diet, ask your caregiver or a registered dietitian. TIPS  Lying flat may make symptoms worse. Keep the head of your bed raised 6 to 9 inches (15 to 23 cm) by using a foam wedge or blocks under the legs of the bed.   Do not lay down until 3 hours after eating a meal.   Daily physical activity may help reduce symptoms.  MAKE SURE YOU:   Understand these instructions.   Will watch your condition.   Will get help right away if you are not doing well or get worse.  Document Released: 10/26/2005 Document Revised: 07/08/2011 Document Reviewed: 03/11/2009 Exit[employer] Patient Information 2012 [employer].Gastroesophageal Reflux Disease, Adult Gastroesophageal reflux disease (GERD) happens when acid from your stomach flows up into the esophagus. When acid comes in contact with the esophagus, the acid causes soreness (inflammation) in the esophagus. Over time, GERD may create small holes (ulcers) in the lining of the esophagus. CAUSES   Increased body weight. This puts pressure on the stomach, making acid rise from the stomach into  the esophagus.   Smoking. This increases acid production in the stomach.   Drinking alcohol. This causes decreased pressure in the lower esophageal sphincter (valve or ring of muscle between the esophagus and stomach), allowing acid from the stomach into the esophagus.   Late evening meals and a full stomach. This increases pressure and acid production in the stomach.   A malformed lower esophageal sphincter.  Sometimes, no cause is found. SYMPTOMS   Burning pain in the lower part of the mid-chest behind the breastbone and in the mid-stomach area. This may occur twice a week or more often.   Trouble swallowing.   Sore throat.   Dry cough.   Asthma-like symptoms including chest tightness, shortness of breath, or wheezing.  DIAGNOSIS  Your caregiver may be able to diagnose GERD based on your symptoms. In some cases, X-rays and other tests may be done to check for complications or to check the condition of your stomach and esophagus. TREATMENT  Your caregiver may recommend over-the-counter or prescription medicines to help decrease acid production. Ask your caregiver before starting or adding any new medicines.  HOME CARE INSTRUCTIONS   Change the factors that you can control. Ask your caregiver for guidance concerning weight loss, quitting smoking, and alcohol consumption.   Avoid foods and drinks that make your symptoms worse, such as:   Caffeine or alcoholic drinks.   Chocolate.   Peppermint or mint flavorings.   Garlic and onions.   Spicy foods.     Citrus fruits, such as oranges, lemons, or limes.   Tomato-based foods such as sauce, chili, salsa, and pizza.   Fried and fatty foods.   Avoid lying down for the 3 hours prior to your bedtime or prior to taking a nap.   Eat small, frequent meals instead of large meals.   Wear loose-fitting clothing. Do not wear anything tight around your waist that causes pressure on your stomach.   Raise the head of your bed 6 to 8  inches with wood blocks to help you sleep. Extra pillows will not help.   Only take over-the-counter or prescription medicines for pain, discomfort, or fever as directed by your caregiver.   Do not take aspirin, ibuprofen, or other nonsteroidal anti-inflammatory drugs (NSAIDs).  SEEK IMMEDIATE MEDICAL CARE IF:   You have pain in your arms, neck, jaw, teeth, or back.   Your pain increases or changes in intensity or duration.   You develop nausea, vomiting, or sweating (diaphoresis).   You develop shortness of breath, or you faint.   Your vomit is green, yellow, black, or looks like coffee grounds or blood.   Your stool is red, bloody, or black.  These symptoms could be signs of other problems, such as heart disease, gastric bleeding, or esophageal bleeding. MAKE SURE YOU:   Understand these instructions.   Will watch your condition.   Will get help right away if you are not doing well or get worse.  Document Released: 08/05/2005 Document Revised: 07/08/2011 Document Reviewed: 05/15/2011 ExitCare Patient Information 2012 [employer]. 

## 2012-01-24 ENCOUNTER — Other Ambulatory Visit: Payer: Self-pay | Admitting: Internal Medicine

## 2012-06-20 ENCOUNTER — Telehealth: Payer: Self-pay | Admitting: Internal Medicine

## 2012-06-20 ENCOUNTER — Other Ambulatory Visit: Payer: Self-pay | Admitting: Internal Medicine

## 2012-06-20 MED ORDER — METFORMIN HCL 500 MG PO TABS: 500.0000 mg | ORAL_TABLET | Freq: Every day | ORAL | Status: DC

## 2012-06-20 MED ORDER — BENAZEPRIL-HYDROCHLOROTHIAZIDE 20-25 MG PO TABS: 1.0000 | ORAL_TABLET | Freq: Every day | ORAL | Status: DC

## 2012-06-20 NOTE — Telephone Encounter (Signed)
rx sent in electronically 

## 2012-06-20 NOTE — Telephone Encounter (Signed)
Pt called req refills of metFORMIN (GLUCOPHAGE) 500 MG tablet and benazepril-hydrochlorthiazide (LOTENSIN HCT) 20-25 MG per tablet  To [facility] [address]. Pt is out of med and is out of town. Pls call in today.

## 2012-08-29 ENCOUNTER — Encounter: Payer: Self-pay | Admitting: Family Medicine

## 2012-08-29 ENCOUNTER — Ambulatory Visit (INDEPENDENT_AMBULATORY_CARE_PROVIDER_SITE_OTHER): Payer: Medicare Other | Admitting: Family Medicine

## 2012-08-29 VITALS — BP 112/74 | HR 87 | Temp 98.3°F | Wt 219.0 lb

## 2012-08-29 DIAGNOSIS — Z23 Encounter for immunization: Secondary | ICD-10-CM

## 2012-08-29 DIAGNOSIS — R350 Frequency of micturition: Secondary | ICD-10-CM

## 2012-08-29 DIAGNOSIS — R109 Unspecified abdominal pain: Secondary | ICD-10-CM

## 2012-08-29 DIAGNOSIS — R195 Other fecal abnormalities: Secondary | ICD-10-CM

## 2012-08-29 LAB — POCT URINALYSIS DIPSTICK
Bilirubin, UA: NEGATIVE
Ketones, UA: NEGATIVE
Leukocytes, UA: NEGATIVE
Protein, UA: NEGATIVE
Spec Grav, UA: 1.015

## 2012-08-29 NOTE — Patient Instructions (Addendum)
-  please take your anti-acid medication daily  -please use Gas-x (simethicone) and/or loperamide as needed for loose stools, diarrhea or gas  -drink plenty of fluids  -please let your doctor know if symptoms worsen or do not improve over next 2-3 days

## 2012-08-29 NOTE — Addendum Note (Signed)
Addended by: Azucena Freed on: 08/29/2012 02:16 PM   Modules accepted: Orders

## 2012-08-29 NOTE — Progress Notes (Signed)
Chief Complaint  Patient presents with  . Abdominal Pain    x 4 to 5 days; worse on yesterday;     HPI: Abdominal discomfort, loose stools: -had one day about 1 week ago with increased urinary frequency for 1 day, resolved -for last several days has had some increased bloating and, difuse abd pain and radiating to back -intermittent and cramping, feels like gas, has had some softer BMs last few days, little nausea -Denies: vomiting, blood in stools, fevers, chills, any other urinary symptoms since last week - but has hx of UTI and concerned about this -Neighbor: viral illness  -PCP Dr. Cato Mulligan, here for acute visit today-due for flu vaccine today and since coming in for flu shot decided to see doctor too -colonoscopy in 2011 with polyps and repeat in 3 yrs -on PPI in the past   ROS: See pertinent positives and negatives per HPI.  Past Medical History  Diagnosis Date  . Diabetes mellitus   . Hypertension   . Palpitations 06/10/2010    Family History  Problem Relation Age of Onset  . Stroke Mother   . Diabetes Mother   . Osteoporosis Mother   . Dementia Mother   . Heart disease Mother     pacemaker  . Hyperlipidemia Father   . Arthritis Sister   . Hyperthyroidism Sister   . Learning disabilities Paternal Grandmother     History   Social History  . Marital Status: Single    Spouse Name: N/A    Number of Children: N/A  . Years of Education: N/A   Social History Main Topics  . Smoking status: Never Smoker   . Smokeless tobacco: None  . Alcohol Use: Yes  . Drug Use: None  . Sexually Active: None   Other Topics Concern  . None   Social History Narrative  . None    Current outpatient prescriptions:aspirin 81 MG tablet, Take 81 mg by mouth daily.  , Disp: , Rfl: ;  benazepril-hydrochlorthiazide (LOTENSIN HCT) 20-25 MG per tablet, Take 1 tablet by mouth daily., Disp: 90 tablet, Rfl: 0;  felodipine (PLENDIL) 5 MG 24 hr tablet, TAKE 1 TABLET EVERY DAY, Disp: 90  tablet, Rfl: 2;  glucose blood (ONE TOUCH ULTRA TEST) test strip, Use as instructed, Disp: 100 each, Rfl: 12 metFORMIN (GLUCOPHAGE) 500 MG tablet, Take 1 tablet (500 mg total) by mouth daily., Disp: 90 tablet, Rfl: 0;  esomeprazole (NEXIUM) 40 MG capsule, Take 1 capsule (40 mg total) by mouth daily., Disp: 30 capsule, Rfl: 1  EXAM:  Filed Vitals:   08/29/12 1326  BP: 112/74  Pulse: 87  Temp: 98.3 F (36.8 C)    There is no height on file to calculate BMI.  GENERAL: vitals reviewed and listed above, alert, oriented, appears well hydrated and in no acute distress  HEENT: atraumatic, conjunttiva clear, no obvious abnormalities on inspection of external nose and ears  NECK: no obvious masses on inspection  ABD: Normal BS, soft, no organomegaly, mild difuse TTP and R and L CVA TTP, no rebound or guarding  MS: moves all extremities without noticeable abnormality  PSYCH: pleasant and cooperative, no obvious depression or anxiety  ASSESSMENT AND PLAN:  Discussed the following assessment and plan:  1. Need for prophylactic vaccination and inoculation against influenza   2. Urinary frequency   3. Loose stools   4. Abdominal pain    -likely gastroenteritis mild, benign exam and normal vitals and appears well, checking urine to ensure not UTI -  Patient advised to return or notify a doctor immediately if symptoms worsen or persist or new concerns arise.  There are no Patient Instructions on file for this visit.   Colin Benton R.

## 2012-08-30 LAB — URINALYSIS, MICROSCOPIC ONLY
Bacteria, UA: NONE SEEN
Casts: NONE SEEN
Crystals: NONE SEEN
Squamous Epithelial / HPF: NONE SEEN

## 2012-09-01 LAB — URINE CULTURE

## 2012-10-09 ENCOUNTER — Other Ambulatory Visit: Payer: Self-pay | Admitting: Internal Medicine

## 2012-10-09 LAB — HM DIABETES EYE EXAM

## 2012-10-10 ENCOUNTER — Other Ambulatory Visit: Payer: Self-pay | Admitting: *Deleted

## 2012-10-10 MED ORDER — BENAZEPRIL-HYDROCHLOROTHIAZIDE 20-25 MG PO TABS: 1.0000 | ORAL_TABLET | Freq: Every day | ORAL | Status: DC

## 2012-10-10 MED ORDER — METFORMIN HCL 500 MG PO TABS: 500.0000 mg | ORAL_TABLET | Freq: Every day | ORAL | Status: DC

## 2012-11-14 ENCOUNTER — Encounter: Payer: Self-pay | Admitting: Internal Medicine

## 2012-11-14 ENCOUNTER — Ambulatory Visit (INDEPENDENT_AMBULATORY_CARE_PROVIDER_SITE_OTHER): Payer: Medicare Other | Admitting: Internal Medicine

## 2012-11-14 VITALS — BP 136/82 | HR 76 | Temp 98.2°F | Ht 69.0 in | Wt 222.0 lb

## 2012-11-14 DIAGNOSIS — K219 Gastro-esophageal reflux disease without esophagitis: Secondary | ICD-10-CM

## 2012-11-14 DIAGNOSIS — N401 Enlarged prostate with lower urinary tract symptoms: Secondary | ICD-10-CM

## 2012-11-14 DIAGNOSIS — I10 Essential (primary) hypertension: Secondary | ICD-10-CM

## 2012-11-14 DIAGNOSIS — N138 Other obstructive and reflux uropathy: Secondary | ICD-10-CM

## 2012-11-14 DIAGNOSIS — E119 Type 2 diabetes mellitus without complications: Secondary | ICD-10-CM

## 2012-11-14 DIAGNOSIS — N139 Obstructive and reflux uropathy, unspecified: Secondary | ICD-10-CM

## 2012-11-14 LAB — BASIC METABOLIC PANEL
Chloride: 101 mEq/L (ref 96–112)
Creatinine, Ser: 0.8 mg/dL (ref 0.4–1.5)
Sodium: 139 mEq/L (ref 135–145)

## 2012-11-14 LAB — LIPID PANEL
Cholesterol: 173 mg/dL (ref 0–200)
LDL Cholesterol: 103 mg/dL — ABNORMAL HIGH (ref 0–99)
Triglycerides: 88 mg/dL (ref 0.0–149.0)
VLDL: 17.6 mg/dL (ref 0.0–40.0)

## 2012-11-14 LAB — HM DIABETES FOOT EXAM

## 2012-11-14 LAB — HEPATIC FUNCTION PANEL
ALT: 36 U/L (ref 0–53)
Bilirubin, Direct: 0.1 mg/dL (ref 0.0–0.3)
Total Bilirubin: 0.7 mg/dL (ref 0.3–1.2)

## 2012-11-14 LAB — CBC WITH DIFFERENTIAL/PLATELET
Basophils Absolute: 0.1 10*3/uL (ref 0.0–0.1)
Eosinophils Absolute: 0.2 10*3/uL (ref 0.0–0.7)
MCHC: 33.5 g/dL (ref 30.0–36.0)
MCV: 87.9 fl (ref 78.0–100.0)
Monocytes Absolute: 0.5 10*3/uL (ref 0.1–1.0)
Neutrophils Relative %: 67.6 % (ref 43.0–77.0)
Platelets: 233 10*3/uL (ref 150.0–400.0)
WBC: 6.6 10*3/uL (ref 4.5–10.5)

## 2012-11-14 NOTE — Assessment & Plan Note (Signed)
Needs labs today

## 2012-11-14 NOTE — Progress Notes (Signed)
  Subjective:    Patient ID: Walter Campbell, male    DOB: 1941-08-09, 72 y.o.   MRN: [id number]  HPI   patient comes in for followup of multiple medical problems including type 2 diabetes, hyperlipidemia, hypertension. The patient does  check blood sugar (100-150) but not blood pressure at home. The patetient does not follow an exercise or diet program. The patient denies any polyuria, polydipsia.  In the past the patient has gone to diabetic treatment center. The patient is tolerating medications  Without difficulty. The patient does admit to medication compliance.   He feels well  Past Medical History  Diagnosis Date  . Diabetes mellitus   . Hypertension   . Palpitations 06/10/2010    History   Social History  . Marital Status: Single    Spouse Name: N/A    Number of Children: N/A  . Years of Education: N/A   Occupational History  . Not on file.   Social History Main Topics  . Smoking status: Never Smoker   . Smokeless tobacco: Not on file  . Alcohol Use: Yes  . Drug Use: Not on file  . Sexually Active: Not on file   Other Topics Concern  . Not on file   Social History Narrative  . No narrative on file    No past surgical history on file.  Family History  Problem Relation Age of Onset  . Stroke Mother   . Diabetes Mother   . Osteoporosis Mother   . Dementia Mother   . Heart disease Mother     pacemaker  . Hyperlipidemia Father   . Arthritis Sister   . Hyperthyroidism Sister   . Learning disabilities Paternal Grandmother     No Known Allergies  Current Outpatient Prescriptions on File Prior to Visit  Medication Sig Dispense Refill  . aspirin 81 MG tablet Take 81 mg by mouth daily.        . benazepril-hydrochlorthiazide (LOTENSIN HCT) 20-25 MG per tablet Take 1 tablet by mouth daily.  90 tablet  2  . esomeprazole (NEXIUM) 40 MG capsule Take 1 capsule (40 mg total) by mouth daily.  30 capsule  1  . felodipine (PLENDIL) 5 MG 24 hr tablet TAKE 1 TABLET  EVERY DAY  90 tablet  2  . metFORMIN (GLUCOPHAGE) 500 MG tablet Take 1 tablet (500 mg total) by mouth daily.  90 tablet  2    Review of Systems  patient denies chest pain, shortness of breath, orthopnea. Denies lower extremity edema, abdominal pain, change in appetite, change in bowel movements. Patient denies rashes, musculoskeletal complaints. No other specific complaints in a complete review of systems.      Objective:   Physical Exam   BP 136/82  Pulse 76  Temp 98.2 F (36.8 C) (Oral)  Ht 5\' 9"  (1.753 m)  Wt 222 lb (100.699 kg)  BMI 32.78 kg/m2  well-developed well-nourished male in no acute distress. HEENT exam atraumatic, normocephalic, neck supple without jugular venous distention. Chest clear to auscultation cardiac exam S1-S2 are regular. Abdominal exam overweight with bowel sounds, soft and nontender. Extremities no edema. Neurologic exam is alert with a normal gait.      Assessment & Plan:

## 2012-11-14 NOTE — Assessment & Plan Note (Signed)
Check cbc today He uses ppi 2 times weekly with good control

## 2012-11-14 NOTE — Assessment & Plan Note (Signed)
He will monitor at home Check bmet

## 2012-11-15 ENCOUNTER — Telehealth: Payer: Self-pay | Admitting: *Deleted

## 2012-11-15 DIAGNOSIS — E119 Type 2 diabetes mellitus without complications: Secondary | ICD-10-CM

## 2012-11-15 DIAGNOSIS — E785 Hyperlipidemia, unspecified: Secondary | ICD-10-CM

## 2012-11-15 NOTE — Telephone Encounter (Signed)
Informed pt of labs being normal- will put labs in for future for b-met,liver ,lipid and aic

## 2012-12-27 ENCOUNTER — Other Ambulatory Visit: Payer: Self-pay | Admitting: Internal Medicine

## 2013-01-12 ENCOUNTER — Other Ambulatory Visit: Payer: Self-pay | Admitting: Family

## 2013-04-19 ENCOUNTER — Ambulatory Visit: Payer: Medicare Other | Attending: Family Medicine | Admitting: Physical Therapy

## 2013-04-19 DIAGNOSIS — M545 Low back pain, unspecified: Secondary | ICD-10-CM | POA: Insufficient documentation

## 2013-04-19 DIAGNOSIS — R5381 Other malaise: Secondary | ICD-10-CM | POA: Insufficient documentation

## 2013-04-19 DIAGNOSIS — M25569 Pain in unspecified knee: Secondary | ICD-10-CM | POA: Insufficient documentation

## 2013-04-19 DIAGNOSIS — IMO0001 Reserved for inherently not codable concepts without codable children: Secondary | ICD-10-CM | POA: Insufficient documentation

## 2013-04-20 ENCOUNTER — Ambulatory Visit: Payer: Medicare Other | Admitting: Physical Therapy

## 2013-04-26 ENCOUNTER — Ambulatory Visit: Payer: Medicare Other | Admitting: Physical Therapy

## 2013-05-01 ENCOUNTER — Ambulatory Visit: Payer: Medicare Other | Admitting: Physical Therapy

## 2013-05-03 ENCOUNTER — Encounter: Payer: Medicare Other | Admitting: Physical Therapy

## 2013-05-04 ENCOUNTER — Ambulatory Visit: Payer: Medicare Other

## 2013-05-08 ENCOUNTER — Ambulatory Visit: Payer: Medicare Other | Admitting: Physical Therapy

## 2013-05-10 ENCOUNTER — Ambulatory Visit: Payer: Medicare Other | Attending: Family Medicine | Admitting: Physical Therapy

## 2013-05-10 DIAGNOSIS — M25569 Pain in unspecified knee: Secondary | ICD-10-CM | POA: Insufficient documentation

## 2013-05-10 DIAGNOSIS — M545 Low back pain, unspecified: Secondary | ICD-10-CM | POA: Insufficient documentation

## 2013-05-10 DIAGNOSIS — IMO0001 Reserved for inherently not codable concepts without codable children: Secondary | ICD-10-CM | POA: Insufficient documentation

## 2013-05-10 DIAGNOSIS — R5381 Other malaise: Secondary | ICD-10-CM | POA: Insufficient documentation

## 2013-05-15 ENCOUNTER — Ambulatory Visit: Payer: Medicare Other

## 2013-05-18 ENCOUNTER — Ambulatory Visit: Payer: Medicare Other | Admitting: Physical Therapy

## 2013-05-18 ENCOUNTER — Encounter: Payer: Self-pay | Admitting: Internal Medicine

## 2013-05-19 MED ORDER — LORAZEPAM 0.5 MG PO TABS: 0.5000 mg | ORAL_TABLET | Freq: Every day | ORAL | Status: DC | PRN

## 2013-05-22 ENCOUNTER — Other Ambulatory Visit: Payer: Self-pay | Admitting: *Deleted

## 2013-05-22 ENCOUNTER — Ambulatory Visit: Payer: Medicare Other

## 2013-05-22 MED ORDER — ESOMEPRAZOLE MAGNESIUM 40 MG PO CPDR: DELAYED_RELEASE_CAPSULE | ORAL | Status: DC

## 2013-05-23 ENCOUNTER — Other Ambulatory Visit: Payer: Self-pay | Admitting: *Deleted

## 2013-05-23 MED ORDER — ESOMEPRAZOLE MAGNESIUM 40 MG PO CPDR: DELAYED_RELEASE_CAPSULE | ORAL | Status: DC

## 2013-05-24 ENCOUNTER — Ambulatory Visit: Payer: Medicare Other | Admitting: Physical Therapy

## 2013-06-07 ENCOUNTER — Telehealth: Payer: Self-pay | Admitting: Internal Medicine

## 2013-06-07 NOTE — Telephone Encounter (Signed)
Patient emailed to request a a1c test. Can orders be placed for this, or the existing orders be extended? Thank you!

## 2013-06-07 NOTE — Telephone Encounter (Signed)
bmet, liver, lipid and a1c

## 2013-06-09 NOTE — Telephone Encounter (Signed)
Scheduled

## 2013-06-14 ENCOUNTER — Other Ambulatory Visit (INDEPENDENT_AMBULATORY_CARE_PROVIDER_SITE_OTHER): Payer: Medicare Other

## 2013-06-14 ENCOUNTER — Other Ambulatory Visit: Payer: Self-pay

## 2013-06-14 DIAGNOSIS — E119 Type 2 diabetes mellitus without complications: Secondary | ICD-10-CM

## 2013-06-14 DIAGNOSIS — E785 Hyperlipidemia, unspecified: Secondary | ICD-10-CM

## 2013-06-14 LAB — HEPATIC FUNCTION PANEL
ALT: 46 U/L (ref 0–53)
Albumin: 3.9 g/dL (ref 3.5–5.2)
Bilirubin, Direct: 0.1 mg/dL (ref 0.0–0.3)
Total Protein: 6.8 g/dL (ref 6.0–8.3)

## 2013-06-14 LAB — LIPID PANEL
Cholesterol: 183 mg/dL (ref 0–200)
HDL: 49.2 mg/dL
LDL Cholesterol: 115 mg/dL — ABNORMAL HIGH (ref 0–99)
Total CHOL/HDL Ratio: 4
Triglycerides: 96 mg/dL (ref 0.0–149.0)
VLDL: 19.2 mg/dL (ref 0.0–40.0)

## 2013-06-14 LAB — BASIC METABOLIC PANEL
CO2: 29 mEq/L (ref 19–32)
Calcium: 9.3 mg/dL (ref 8.4–10.5)
Chloride: 104 mEq/L (ref 96–112)
Creatinine, Ser: 1 mg/dL (ref 0.4–1.5)
Glucose, Bld: 142 mg/dL — ABNORMAL HIGH (ref 70–99)

## 2013-06-14 LAB — HEMOGLOBIN A1C: Hgb A1c MFr Bld: 7.3 % — ABNORMAL HIGH (ref 4.6–6.5)

## 2013-07-10 ENCOUNTER — Other Ambulatory Visit: Payer: Self-pay | Admitting: Internal Medicine

## 2013-07-19 ENCOUNTER — Ambulatory Visit (INDEPENDENT_AMBULATORY_CARE_PROVIDER_SITE_OTHER): Payer: Medicare Other

## 2013-07-19 DIAGNOSIS — Z23 Encounter for immunization: Secondary | ICD-10-CM

## 2013-08-28 ENCOUNTER — Other Ambulatory Visit: Payer: Self-pay | Admitting: Internal Medicine

## 2013-10-13 ENCOUNTER — Other Ambulatory Visit: Payer: Self-pay | Admitting: Internal Medicine

## 2013-10-27 ENCOUNTER — Encounter: Payer: Self-pay | Admitting: Internal Medicine

## 2013-11-11 LAB — HM DIABETES EYE EXAM

## 2013-11-25 ENCOUNTER — Other Ambulatory Visit: Payer: Self-pay | Admitting: Internal Medicine

## 2014-01-15 ENCOUNTER — Other Ambulatory Visit: Payer: Self-pay | Admitting: Internal Medicine

## 2014-02-15 ENCOUNTER — Other Ambulatory Visit: Payer: Self-pay

## 2014-02-16 ENCOUNTER — Encounter: Payer: Medicare Other | Admitting: Internal Medicine

## 2014-02-19 ENCOUNTER — Encounter: Payer: Self-pay | Admitting: Internal Medicine

## 2014-02-19 ENCOUNTER — Ambulatory Visit (INDEPENDENT_AMBULATORY_CARE_PROVIDER_SITE_OTHER): Payer: Medicare Other | Admitting: Internal Medicine

## 2014-02-19 VITALS — BP 124/72 | HR 80 | Temp 99.0°F | Ht 68.75 in | Wt 223.0 lb

## 2014-02-19 DIAGNOSIS — M25569 Pain in unspecified knee: Secondary | ICD-10-CM

## 2014-02-19 DIAGNOSIS — E119 Type 2 diabetes mellitus without complications: Secondary | ICD-10-CM

## 2014-02-19 DIAGNOSIS — I10 Essential (primary) hypertension: Secondary | ICD-10-CM

## 2014-02-19 DIAGNOSIS — N401 Enlarged prostate with lower urinary tract symptoms: Secondary | ICD-10-CM

## 2014-02-19 DIAGNOSIS — M25561 Pain in right knee: Secondary | ICD-10-CM

## 2014-02-19 DIAGNOSIS — Z0000 Encounter for general adult medical examination without abnormal findings: Secondary | ICD-10-CM

## 2014-02-19 LAB — LIPID PANEL
CHOLESTEROL: 173 mg/dL (ref 0–200)
HDL: 53 mg/dL (ref >39.00)
LDL CALC: 101 mg/dL — AB (ref 0–99)
Total CHOL/HDL Ratio: 3
Triglycerides: 95 mg/dL (ref 0.0–149.0)
VLDL: 19 mg/dL (ref 0.0–40.0)

## 2014-02-19 LAB — CBC WITH DIFFERENTIAL/PLATELET
Basophils Absolute: 0.1 10*3/uL (ref 0.0–0.1)
Basophils Relative: 0.8 % (ref 0.0–3.0)
Eosinophils Absolute: 0.3 10*3/uL (ref 0.0–0.7)
Eosinophils Relative: 4 % (ref 0.0–5.0)
HEMATOCRIT: 45.3 % (ref 39.0–52.0)
Hemoglobin: 15.1 g/dL (ref 13.0–17.0)
Lymphocytes Relative: 12.6 % (ref 12.0–46.0)
Lymphs Abs: 1.1 10*3/uL (ref 0.7–4.0)
MCHC: 33.3 g/dL (ref 30.0–36.0)
MCV: 89 fl (ref 78.0–100.0)
MONOS PCT: 8.8 % (ref 3.0–12.0)
Monocytes Absolute: 0.7 10*3/uL (ref 0.1–1.0)
NEUTROS ABS: 6.2 10*3/uL (ref 1.4–7.7)
Neutrophils Relative %: 73.8 % (ref 43.0–77.0)
Platelets: 247 10*3/uL (ref 150.0–400.0)
RBC: 5.08 Mil/uL (ref 4.22–5.81)
RDW: 14.4 % (ref 11.5–14.6)
WBC: 8.3 10*3/uL (ref 4.5–10.5)

## 2014-02-19 LAB — POCT URINALYSIS DIPSTICK
Bilirubin, UA: NEGATIVE
Blood, UA: NEGATIVE
Glucose, UA: NEGATIVE
KETONES UA: NEGATIVE
Leukocytes, UA: NEGATIVE
Nitrite, UA: NEGATIVE
PH UA: 5
PROTEIN UA: NEGATIVE
Urobilinogen, UA: 0.2

## 2014-02-19 LAB — HEPATIC FUNCTION PANEL
ALBUMIN: 4 g/dL (ref 3.5–5.2)
ALT: 45 U/L (ref 0–53)
AST: 36 U/L (ref 0–37)
Alkaline Phosphatase: 53 U/L (ref 39–117)
BILIRUBIN TOTAL: 0.8 mg/dL (ref 0.3–1.2)
Bilirubin, Direct: 0.1 mg/dL (ref 0.0–0.3)
Total Protein: 6.8 g/dL (ref 6.0–8.3)

## 2014-02-19 LAB — MICROALBUMIN / CREATININE URINE RATIO
Creatinine,U: 258 mg/dL
MICROALB UR: 0.8 mg/dL (ref 0.0–1.9)
MICROALB/CREAT RATIO: 0.3 mg/g (ref 0.0–30.0)

## 2014-02-19 LAB — BASIC METABOLIC PANEL
BUN: 15 mg/dL (ref 6–23)
CHLORIDE: 99 meq/L (ref 96–112)
CO2: 29 mEq/L (ref 19–32)
Calcium: 9.3 mg/dL (ref 8.4–10.5)
Creatinine, Ser: 0.8 mg/dL (ref 0.4–1.5)
GFR: 96.95 mL/min (ref >60.00)
GLUCOSE: 140 mg/dL — AB (ref 70–99)
POTASSIUM: 4.7 meq/L (ref 3.5–5.1)
SODIUM: 139 meq/L (ref 135–145)

## 2014-02-19 LAB — PSA: PSA: 2.07 ng/mL (ref 0.10–4.00)

## 2014-02-19 LAB — HEMOGLOBIN A1C: Hgb A1c MFr Bld: 7.7 % — ABNORMAL HIGH (ref 4.6–6.5)

## 2014-02-19 LAB — TSH: TSH: 1.49 u[IU]/mL (ref 0.35–5.50)

## 2014-02-19 LAB — HM DIABETES FOOT EXAM

## 2014-02-19 NOTE — Progress Notes (Signed)
cpx  Past Medical History  Diagnosis Date  . Diabetes mellitus   . Hypertension   . Palpitations 06/10/2010    History   Social History  . Marital Status: Single    Spouse Name: N/A    Number of Children: N/A  . Years of Education: N/A   Occupational History  . Not on file.   Social History Main Topics  . Smoking status: Never Smoker   . Smokeless tobacco: Not on file  . Alcohol Use: Yes  . Drug Use: Not on file  . Sexual Activity: Not on file   Other Topics Concern  . Not on file   Social History Narrative  . No narrative on file    No past surgical history on file.  Family History  Problem Relation Age of Onset  . Stroke Mother   . Diabetes Mother   . Osteoporosis Mother   . Dementia Mother   . Heart disease Mother     pacemaker  . Hyperlipidemia Father   . Arthritis Sister   . Hyperthyroidism Sister   . Learning disabilities Paternal Grandmother     No Known Allergies  Current Outpatient Prescriptions on File Prior to Visit  Medication Sig Dispense Refill  . aspirin 81 MG tablet Take 81 mg by mouth daily.        . benazepril-hydrochlorthiazide (LOTENSIN HCT) 20-25 MG per tablet TAKE 1 TABLET BY MOUTH EVERY DAY  90 tablet  2  . esomeprazole (NEXIUM) 40 MG capsule TAKE ONE CAPSULE EVERY DAY  30 capsule  5  . felodipine (PLENDIL) 5 MG 24 hr tablet TAKE 1 TABLET BY MOUTH EVERY DAY  90 tablet  0  . LORazepam (ATIVAN) 0.5 MG tablet Take 1 tablet (0.5 mg total) by mouth daily as needed for anxiety.  10 tablet  1  . metFORMIN (GLUCOPHAGE) 500 MG tablet TAKE 1 TABLET BY MOUTH ONCE DAILY  90 tablet  0  . ONE TOUCH ULTRA TEST test strip USE AS INSTRUCTED  100 each  11   No current facility-administered medications on file prior to visit.     patient denies chest pain, shortness of breath, orthopnea. Denies lower extremity edema, abdominal pain, change in appetite, change in bowel movements. Patient denies rashes, musculoskeletal complaints. No other specific  complaints in a complete review of systems.   BP 124/72  Pulse 80  Temp(Src) 99 F (37.2 C) (Oral)  Ht 5' 8.75" (1.746 m)  Wt 223 lb (101.152 kg)  BMI 33.18 kg/m2  well-developed well-nourished male in no acute distress. HEENT exam atraumatic, normocephalic, neck supple without jugular venous distention. Chest clear to auscultation cardiac exam S1-S2 are regular. Abdominal exam overweight with bowel sounds, soft and nontender. Extremities no edema. Neurologic exam is alert with a normal gait.  Well Visit- health maint UTD Discussed need for aggressive weight loss  Knee pain- , right check xray

## 2014-02-19 NOTE — Assessment & Plan Note (Signed)
Discussed Home cbgs in the 120-140 range Will check a1c and other labs today.

## 2014-02-19 NOTE — Progress Notes (Signed)
Pre visit review using our clinic review tool, if applicable. No additional management support is needed unless otherwise documented below in the visit note. 

## 2014-02-20 ENCOUNTER — Ambulatory Visit (INDEPENDENT_AMBULATORY_CARE_PROVIDER_SITE_OTHER)
Admission: RE | Admit: 2014-02-20 | Discharge: 2014-02-20 | Disposition: A | Discharge status: Home / Self Care | Payer: Medicare Other | Source: Ambulatory Visit | Attending: Internal Medicine | Admitting: Internal Medicine

## 2014-02-20 DIAGNOSIS — M25569 Pain in unspecified knee: Secondary | ICD-10-CM

## 2014-02-20 DIAGNOSIS — Z0000 Encounter for general adult medical examination without abnormal findings: Secondary | ICD-10-CM

## 2014-02-20 DIAGNOSIS — M25561 Pain in right knee: Secondary | ICD-10-CM

## 2014-02-25 ENCOUNTER — Other Ambulatory Visit: Payer: Self-pay | Admitting: Internal Medicine

## 2014-02-26 ENCOUNTER — Encounter: Payer: Self-pay | Admitting: Internal Medicine

## 2014-02-26 DIAGNOSIS — E119 Type 2 diabetes mellitus without complications: Secondary | ICD-10-CM

## 2014-03-01 ENCOUNTER — Other Ambulatory Visit: Payer: Self-pay

## 2014-03-01 DIAGNOSIS — M25561 Pain in right knee: Secondary | ICD-10-CM

## 2014-03-06 ENCOUNTER — Telehealth: Payer: Self-pay

## 2014-03-06 ENCOUNTER — Other Ambulatory Visit: Payer: Self-pay | Admitting: Orthopedic Surgery

## 2014-03-06 DIAGNOSIS — M25561 Pain in right knee: Secondary | ICD-10-CM

## 2014-03-06 NOTE — Telephone Encounter (Signed)
Relevant patient education assigned to patient using Emmi. ° °

## 2014-03-08 ENCOUNTER — Encounter: Payer: Self-pay | Admitting: Internal Medicine

## 2014-03-08 ENCOUNTER — Ambulatory Visit (INDEPENDENT_AMBULATORY_CARE_PROVIDER_SITE_OTHER): Payer: Medicare Other | Admitting: Internal Medicine

## 2014-03-08 VITALS — BP 134/78 | HR 79 | Temp 98.3°F | Resp 12 | Ht 69.0 in | Wt 225.0 lb

## 2014-03-08 DIAGNOSIS — E119 Type 2 diabetes mellitus without complications: Secondary | ICD-10-CM

## 2014-03-08 MED ORDER — METFORMIN HCL 500 MG PO TABS: 1000.0000 mg | ORAL_TABLET | Freq: Two times a day (BID) | ORAL | Status: DC

## 2014-03-08 NOTE — Patient Instructions (Signed)
Please add another Metformin tablet (500 mg) with breakfast x 3 days. If you tolerate this well, add another metformin tablet with dinner (total 1000 mg) x 3 days. If you tolerate this well, add another metformin tablets with breakfast (total 1000 mg). Continue with 1000 mg of metformin 2x a day with breakfast and dinner.  Please return in 3 months with your sugar log.   PATIENT INSTRUCTIONS FOR TYPE 2 DIABETES:  DIET AND EXERCISE Diet and exercise is an important part of diabetic treatment.  We recommended aerobic exercise in the form of brisk walking (working between 40-60% of maximal aerobic capacity, similar to brisk walking) for 150 minutes per week (such as 30 minutes five days per week) along with 3 times per week performing 'resistance' training (using various gauge rubber tubes with handles) 5-10 exercises involving the major muscle groups (upper body, lower body and core) performing 10-15 repetitions (or near fatigue) each exercise. Start at half the above goal but build slowly to reach the above goals. If limited by weight, joint pain, or disability, we recommend daily walking in a swimming pool with water up to waist to reduce pressure from joints while allow for adequate exercise.    BLOOD GLUCOSES Monitoring your blood glucoses is important for continued management of your diabetes. Please check your blood glucoses 2-4 times a day: fasting, before meals and at bedtime (you can rotate these measurements - e.g. one day check before the 3 meals, the next day check before 2 of the meals and before bedtime, etc.).   HYPOGLYCEMIA (low blood sugar) Hypoglycemia is usually a reaction to not eating, exercising, or taking too much insulin/ other diabetes drugs.  Symptoms include tremors, sweating, hunger, confusion, headache, etc. Treat IMMEDIATELY with 15 grams of Carbs:   4 glucose tablets    cup regular juice/soda   2 tablespoons raisins   4 teaspoons sugar   1 tablespoon honey Recheck  blood glucose in 15 mins and repeat above if still symptomatic/blood glucose <100.  RECOMMENDATIONS TO REDUCE YOUR RISK OF DIABETIC COMPLICATIONS: * Take your prescribed MEDICATION(S) * Follow a DIABETIC diet: Complex carbs, fiber rich foods, (monounsaturated and polyunsaturated) fats * AVOID saturated/trans fats, high fat foods, >2,300 mg salt per day. * EXERCISE at least 5 times a week for 30 minutes or preferably daily.  * DO NOT SMOKE OR DRINK more than 1 drink a day. * Check your FEET every day. Do not wear tightfitting shoes. Contact us if you develop an ulcer * See your EYE doctor once a year or more if needed * Get a FLU shot once a year * Get a PNEUMONIA vaccine once before and once after age 28 years  GOALS:  * Your Hemoglobin A1c of <7%  * fasting sugars need to be <130 * after meals sugars need to be <180 (2h after you start eating) * Your Systolic BP should be 630 or lower  * Your Diastolic BP should be 80 or lower  * Your HDL (Good Cholesterol) should be 40 or higher  * Your LDL (Bad Cholesterol) should be 100 or lower. * Your Triglycerides should be 150 or lower  * Your Urine microalbumin (kidney function) should be <30 * Your Body Mass Index should be 25 or lower   We will be glad to help you achieve these goals. Our telephone number is: [phone].

## 2014-03-08 NOTE — Progress Notes (Signed)
Patient ID: Walter Campbell, male   DOB: October 06, 1941, 73 y.o.   MRN: [id number]  HPI: Walter Campbell is a 73 y.o.-year-old male, referred by his PCP, Dr. Leanne Chang, for management of DM2, non-insulin-dependent, uncontrolled, without complications.  Patient has been diagnosed with diabetes in 2010; he has not been on insulin before. Last hemoglobin A1c was: Lab Results  Component Value Date   HGBA1C 7.7* 02/19/2014   HGBA1C 7.3* 06/14/2013   HGBA1C 6.9* 11/14/2012   Pt is on a regimen of: - Metformin 500 mg daily after dinner  Pt checks his sugars 1-2x a week  and they are: - am: 112-160 (ave 144) - 2h after b'fast: n/c - before lunch: n/c - 2h after lunch: n/c - before dinner: n/c - 2h after dinner: n/c - bedtime: n/c - nighttime: n/c No lows. Lowest sugar was 112; he has hypoglycemia awareness at 80.  Highest sugar was 155.  He has a Research scientist (medical).  Pt's meals are: - Breakfast: eggs 3x a week + bread; cheese toast; cereals;  - Lunch: ham and cheese sandwich + fruit - Dinner: meat + veg (no starch) or salad; sweet potatoes; rice - Snacks: 2-3: fruit, V8 juice  He exercises: Pilates 2x a week, walks 3x a week 2.2 miles - in 45 min.  - no CKD, last BUN/creatinine:  Lab Results  Component Value Date   BUN 15 02/19/2014   CREATININE 0.8 02/19/2014  He is on Benazepril. - last set of lipids: Lab Results  Component Value Date   CHOL 173 02/19/2014   HDL 53.00 02/19/2014   LDLCALC 101* 02/19/2014   TRIG 95.0 02/19/2014   CHOLHDL 3 02/19/2014  Not on a statin.  - last eye exam was in 11/2013. No DR.  - no numbness and tingling in his feet. He sees a podiatrist.  On ASA 81.  Pt has FH of DM in mother, family on father's side.  ROS: Constitutional: no weight gain/loss, no fatigue, no subjective hyperthermia/hypothermia, + poor sleep, + nocturia Eyes: no blurry vision, no xerophthalmia ENT: no sore throat, no nodules palpated in throat, no dysphagia/odynophagia, no  hoarseness Cardiovascular: no CP/SOB/palpitations/leg swelling Respiratory: no cough/SOB Gastrointestinal: no N/V/D/C, + heartburn Musculoskeletal: no muscle/+ joint aches Skin: no rashes Neurological: no tremors/numbness/tingling/dizziness Psychiatric: no depression/anxiety  Past Medical History  Diagnosis Date  . Diabetes mellitus   . Hypertension   . Palpitations 06/10/2010   History reviewed. No pertinent past surgical history. History   Social History  . Marital Status: married    Spouse Name: N/A    Number of Children: 72, 70 y/o   Occupational History  . Retired Optometrist   Social History Main Topics  . Smoking status: Never Smoker   . Smokeless tobacco: Not on file  . Alcohol Use: Yes - beer/wine 1 drink 3-4x a week  . Drug Use: Not on file   Current Outpatient Prescriptions on File Prior to Visit  Medication Sig Dispense Refill  . aspirin 81 MG tablet Take 81 mg by mouth daily.        . benazepril-hydrochlorthiazide (LOTENSIN HCT) 20-25 MG per tablet TAKE 1 TABLET BY MOUTH EVERY DAY  90 tablet  2  . esomeprazole (NEXIUM) 40 MG capsule TAKE ONE CAPSULE EVERY DAY  30 capsule  5  . felodipine (PLENDIL) 5 MG 24 hr tablet TAKE 1 TABLET BY MOUTH ONCE DAILY *PT NEEDS OFFICE VISIT*  90 tablet  0  . LORazepam (ATIVAN) 0.5 MG tablet Take  1 tablet (0.5 mg total) by mouth daily as needed for anxiety.  10 tablet  1  . ONE TOUCH ULTRA TEST test strip USE AS INSTRUCTED  100 each  11   No current facility-administered medications on file prior to visit.   No Known Allergies Family History  Problem Relation Age of Onset  . Stroke Mother   . Diabetes Mother   . Osteoporosis Mother   . Dementia Mother   . Heart disease Mother     pacemaker  . Hyperlipidemia Father   . Arthritis Sister   . Hyperthyroidism Sister   . Learning disabilities Paternal Grandmother      PE: BP 134/78  Pulse 79  Temp(Src) 98.3 F (36.8 C) (Oral)  Resp 12  Ht 5\' 9"  (1.753 m)  Wt 225 lb  (102.059 kg)  BMI 33.21 kg/m2  SpO2 97% Wt Readings from Last 3 Encounters:  03/08/14 225 lb (102.059 kg)  02/19/14 223 lb (101.152 kg)  11/14/12 222 lb (100.699 kg)   Constitutional: overweight, in NAD Eyes: PERRLA, EOMI, no exophthalmos ENT: moist mucous membranes, no thyromegaly, no cervical lymphadenopathy Cardiovascular: RRR, No MRG Respiratory: CTA B Gastrointestinal: abdomen soft, NT, ND, BS+ Musculoskeletal: no deformities, strength intact in all 4 Skin: moist, warm, no rashes Neurological: no tremor with outstretched hands, DTR 2/4 in all 4  ASSESSMENT: 1. DM2, non-insulin-dependent, uncontrolled, without complications  PLAN:  1. Patient with long-standing, recently more uncontrolled diabetes, on minimum oral antidiabetic regimen, which became insufficient - We discussed about options for treatment, and I suggested to:  Patient Instructions  Please add another Metformin tablet (500 mg) with breakfast x 3 days. If you tolerate this well, add another metformin tablet with dinner (total 1000 mg) x 3 days. If you tolerate this well, add another metformin tablets with breakfast (total 1000 mg). Continue with 1000 mg of metformin 2x a day with breakfast and dinner.  Please return in 3 months with your sugar log.   - Strongly advised him to start checking sugars at different times of the day - check once  a day, rotating checks - given sugar log and advised how to fill it and to bring it at next appt  - given foot care handout and explained the principles  - given instructions for hypoglycemia management "15-15 rule"  - advised for yearly eye exams >> she is up to date - Return to clinic in 1 mo with sugar log

## 2014-03-10 ENCOUNTER — Ambulatory Visit
Admission: RE | Admit: 2014-03-10 | Discharge: 2014-03-10 | Disposition: A | Discharge status: Home / Self Care | Payer: Medicare Other | Source: Ambulatory Visit | Attending: Orthopedic Surgery | Admitting: Orthopedic Surgery

## 2014-03-10 DIAGNOSIS — M25561 Pain in right knee: Secondary | ICD-10-CM

## 2014-04-11 ENCOUNTER — Other Ambulatory Visit: Payer: Self-pay | Admitting: Internal Medicine

## 2014-04-18 ENCOUNTER — Ambulatory Visit: Payer: Medicare Other | Attending: Orthopedic Surgery

## 2014-04-18 DIAGNOSIS — IMO0001 Reserved for inherently not codable concepts without codable children: Secondary | ICD-10-CM | POA: Insufficient documentation

## 2014-04-18 DIAGNOSIS — M25569 Pain in unspecified knee: Secondary | ICD-10-CM | POA: Diagnosis not present

## 2014-04-18 DIAGNOSIS — R269 Unspecified abnormalities of gait and mobility: Secondary | ICD-10-CM | POA: Insufficient documentation

## 2014-04-18 DIAGNOSIS — R5381 Other malaise: Secondary | ICD-10-CM | POA: Diagnosis not present

## 2014-04-18 DIAGNOSIS — M25669 Stiffness of unspecified knee, not elsewhere classified: Secondary | ICD-10-CM | POA: Insufficient documentation

## 2014-04-23 ENCOUNTER — Ambulatory Visit: Payer: Medicare Other | Admitting: Physical Therapy

## 2014-04-23 DIAGNOSIS — IMO0001 Reserved for inherently not codable concepts without codable children: Secondary | ICD-10-CM | POA: Diagnosis not present

## 2014-04-26 ENCOUNTER — Ambulatory Visit: Payer: Medicare Other

## 2014-04-26 DIAGNOSIS — IMO0001 Reserved for inherently not codable concepts without codable children: Secondary | ICD-10-CM | POA: Diagnosis not present

## 2014-04-30 ENCOUNTER — Ambulatory Visit: Payer: Medicare Other | Admitting: Physical Therapy

## 2014-05-03 ENCOUNTER — Ambulatory Visit: Payer: Medicare Other

## 2014-05-07 ENCOUNTER — Ambulatory Visit: Payer: Medicare Other

## 2014-05-09 ENCOUNTER — Ambulatory Visit: Payer: Medicare Other | Attending: Orthopedic Surgery | Admitting: Physical Therapy

## 2014-05-09 DIAGNOSIS — M25669 Stiffness of unspecified knee, not elsewhere classified: Secondary | ICD-10-CM | POA: Diagnosis not present

## 2014-05-09 DIAGNOSIS — M25569 Pain in unspecified knee: Secondary | ICD-10-CM | POA: Diagnosis not present

## 2014-05-09 DIAGNOSIS — R5381 Other malaise: Secondary | ICD-10-CM | POA: Diagnosis not present

## 2014-05-09 DIAGNOSIS — IMO0001 Reserved for inherently not codable concepts without codable children: Secondary | ICD-10-CM | POA: Diagnosis not present

## 2014-05-09 DIAGNOSIS — R269 Unspecified abnormalities of gait and mobility: Secondary | ICD-10-CM | POA: Diagnosis not present

## 2014-05-14 ENCOUNTER — Ambulatory Visit: Payer: Medicare Other | Admitting: Physical Therapy

## 2014-05-14 DIAGNOSIS — IMO0001 Reserved for inherently not codable concepts without codable children: Secondary | ICD-10-CM | POA: Diagnosis not present

## 2014-05-16 ENCOUNTER — Ambulatory Visit: Payer: Medicare Other

## 2014-05-16 DIAGNOSIS — IMO0001 Reserved for inherently not codable concepts without codable children: Secondary | ICD-10-CM | POA: Diagnosis not present

## 2014-05-21 ENCOUNTER — Ambulatory Visit: Payer: Medicare Other

## 2014-05-21 DIAGNOSIS — IMO0001 Reserved for inherently not codable concepts without codable children: Secondary | ICD-10-CM | POA: Diagnosis not present

## 2014-05-22 ENCOUNTER — Other Ambulatory Visit: Payer: Self-pay | Admitting: Internal Medicine

## 2014-05-22 ENCOUNTER — Other Ambulatory Visit: Payer: Self-pay | Admitting: *Deleted

## 2014-05-22 MED ORDER — GLUCOSE BLOOD VI STRP: ORAL_STRIP | Status: DC

## 2014-06-07 ENCOUNTER — Ambulatory Visit (INDEPENDENT_AMBULATORY_CARE_PROVIDER_SITE_OTHER): Payer: Medicare Other | Admitting: Internal Medicine

## 2014-06-07 ENCOUNTER — Encounter: Payer: Self-pay | Admitting: Internal Medicine

## 2014-06-07 VITALS — BP 118/68 | HR 84 | Temp 98.1°F | Resp 12 | Wt 218.0 lb

## 2014-06-07 DIAGNOSIS — E119 Type 2 diabetes mellitus without complications: Secondary | ICD-10-CM

## 2014-06-07 LAB — HEMOGLOBIN A1C: HEMOGLOBIN A1C: 7.1 % — AB (ref 4.6–6.5)

## 2014-06-07 NOTE — Progress Notes (Signed)
Patient ID: Walter Campbell, male   DOB: [birth year], 73 y.o.   MRN: [id number]  HPI: Walter Campbell is a 73 y.o.-year-old male, returning for f/u for DM2, dx 2010, non-insulin-dependent, uncontrolled, without complications. Last visit 3 mo ago.  Last hemoglobin A1c was: Lab Results  Component Value Date   HGBA1C 7.7* 02/19/2014   HGBA1C 7.3* 06/14/2013   HGBA1C 6.9* 11/14/2012   Pt is on a regimen of: - Metformin 1000 mg bid  Pt checks his sugars 1-2x a week  and they are: - am: 112-160 (ave 144) >> 111-147 - 2h after b'fast: n/c >> 120-133 - before lunch: n/c >> 118-148 (160 x1) - 2h after lunch: n/c >> 132-148 - before dinner: n/c >> 117-138 (160 x1) - 2h after dinner: n/c >> 107-145 - bedtime: n/c >> 155, 166 - nighttime: n/c No lows. Lowest sugar was 107; he has hypoglycemia awareness at 80.  Highest sugar was 160.  He has a Research scientist (medical).  Pt's meals are: - Breakfast: eggs 3x a week + bread; cheese toast; cereals;  - Lunch: ham and cheese sandwich + fruit - Dinner: meat + veg (no starch) or salad; sweet potatoes; rice - Snacks: 2-3: fruit, V8 juice  He exercises: Pilates 2x a week, walks 3x a week 2.2 miles - in 45 min.  - no CKD, last BUN/creatinine:  Lab Results  Component Value Date   BUN 15 02/19/2014   CREATININE 0.8 02/19/2014  He is on Benazepril. - last set of lipids: Lab Results  Component Value Date   CHOL 173 02/19/2014   HDL 53.00 02/19/2014   LDLCALC 101* 02/19/2014   TRIG 95.0 02/19/2014   CHOLHDL 3 02/19/2014  Not on a statin.  - last eye exam was in 11/2013. No DR.  - no numbness and tingling in his feet. He sees a podiatrist.  On ASA 81.  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 He had R TKR at the beginning of 04/2014.  PE: BP 118/68  Pulse 84  Temp(Src) 98.1 F (36.7 C) (Oral)  Resp 12  Wt 218 lb (98.884 kg)  SpO2 96% Wt Readings from Last 3 Encounters:  06/07/14 218 lb (98.884 kg)  03/08/14 225 lb (102.059 kg)  02/19/14 223 lb (101.152 kg)   Constitutional: overweight, in NAD Eyes: PERRLA, EOMI, no exophthalmos ENT: moist mucous membranes, no thyromegaly, no cervical lymphadenopathy Cardiovascular: RRR, No MRG Respiratory: CTA B Gastrointestinal: abdomen soft, NT, ND, BS+ Musculoskeletal: no deformities, strength intact in all 4 Skin: moist, warm, no rashes Neurological: no tremor with outstretched hands, DTR 2/4 in all 4  ASSESSMENT: 1. DM2, non-insulin-dependent, uncontrolled, without complications  PLAN:  1. Patient with long-standing, recently more controlled diabetes, on minimum oral antidiabetic regimen. Sugars improved with increasing metformin, but he can only tolerate 500 mg bid.  - I congratulated him for his 7 lb weight loss. -  I suggested to:  Patient Instructions  Please try to increase Metformin to 500 mg  3x a day Please return in 3 months with your sugar log.  - continue checking sugars at different times of the day - check once a day, rotating checks - he is doing a great job with this - advised for yearly eye exams >> he is up to date - will check HbA1c now - Return to clinic in 3 mo with sugar log   Office Visit on 06/07/2014  Component Date Value Ref Range Status  . Hemoglobin A1C 06/07/2014 7.1* 4.6 - 6.5 % Final   Glycemic Control Guidelines  for People with Diabetes:Non Diabetic:  <6%Goal of Therapy: <7%Additional Action Suggested:  >8%    HbA1c is better!

## 2014-06-07 NOTE — Patient Instructions (Signed)
Please try to increase Metformin to 500 mg 3x a day Please return in 3 months with your sugar log.  Please stop at the lab.

## 2014-06-11 ENCOUNTER — Other Ambulatory Visit: Payer: Self-pay | Admitting: Internal Medicine

## 2014-06-20 ENCOUNTER — Telehealth: Payer: Self-pay | Admitting: Internal Medicine

## 2014-06-20 NOTE — Telephone Encounter (Signed)
Would advise establishing with one of the providers hired to take over for Dr. Leanne Chang if he is ok with this. Unfortunately, my next transfer/new patient visit is > 6 months, and please let him know I don't rx ativan for regular use in adult patients.

## 2014-06-20 NOTE — Telephone Encounter (Signed)
lmom for pt to cb

## 2014-06-20 NOTE — Telephone Encounter (Signed)
Pt will go with dr hunter

## 2014-06-20 NOTE — Telephone Encounter (Signed)
Pt would lke to est with dr Maudie Mercury. Can I sch?

## 2014-09-03 ENCOUNTER — Other Ambulatory Visit: Payer: Self-pay | Admitting: Internal Medicine

## 2014-09-07 ENCOUNTER — Ambulatory Visit: Payer: Medicare Other | Admitting: Internal Medicine

## 2014-09-13 ENCOUNTER — Telehealth: Payer: Self-pay | Admitting: Internal Medicine

## 2014-09-13 MED ORDER — BENAZEPRIL-HYDROCHLOROTHIAZIDE 20-25 MG PO TABS: ORAL_TABLET | ORAL | Status: DC

## 2014-09-13 NOTE — Telephone Encounter (Signed)
rx sent in electronically 

## 2014-09-13 NOTE — Telephone Encounter (Signed)
[id number] - [place], [address] [place] [address] is requesting re-fill on benazepril-hydrochlorthiazide (LOTENSIN HCT) 20-25 MG per tablet

## 2014-10-02 ENCOUNTER — Encounter: Payer: Self-pay | Admitting: Family Medicine

## 2014-10-02 ENCOUNTER — Ambulatory Visit (INDEPENDENT_AMBULATORY_CARE_PROVIDER_SITE_OTHER): Payer: Medicare Other | Admitting: Family Medicine

## 2014-10-02 VITALS — BP 138/81 | HR 73 | Temp 97.9°F | Ht 69.0 in | Wt 212.0 lb

## 2014-10-02 DIAGNOSIS — Z23 Encounter for immunization: Secondary | ICD-10-CM

## 2014-10-02 DIAGNOSIS — R002 Palpitations: Secondary | ICD-10-CM

## 2014-10-02 DIAGNOSIS — R197 Diarrhea, unspecified: Secondary | ICD-10-CM

## 2014-10-02 NOTE — Progress Notes (Signed)
Pre visit review using our clinic review tool, if applicable. No additional management support is needed unless otherwise documented below in the visit note. 

## 2014-10-02 NOTE — Addendum Note (Signed)
Addended by: Aggie Hacker A on: 10/02/2014 09:38 AM   Modules accepted: Orders

## 2014-10-02 NOTE — Progress Notes (Signed)
   Subjective:    Patient ID: Walter Campbell, male    DOB: 06/10/1941, 73 y.o.   MRN: [id number]  HPI Here for 2 things. First he has daily loose stools, but not frank diarrhea. No cramps or pain. He takes Metformin for diabetes per Dr. Cruzita Lederer, and this has always caused GI side effects. He takes 500 mg bid, and this is all he can tolerate. He asks if there is anything to worry about. Also he has a hx of GERD and he takes Nexium, but only on a prn basis. Last week while traveling in [place], [place] he felt a few episodes of "palpitations" which lasted only a few seconds each. He describes this as a "catch" in the chest which had no assicated pain or SOB. He has felt fine this past week.    Review of Systems  Constitutional: Negative.   Respiratory: Negative.   Cardiovascular: Positive for palpitations. Negative for chest pain and leg swelling.  Gastrointestinal: Positive for diarrhea. Negative for nausea, vomiting, abdominal pain, constipation, blood in stool, abdominal distention, anal bleeding and rectal pain.       Objective:   Physical Exam  Constitutional: He appears well-developed and well-nourished. No distress.  Cardiovascular: Normal rate, regular rhythm, normal heart sounds and intact distal pulses.   EKG normal   Pulmonary/Chest: Effort normal and breath sounds normal.  Abdominal: Soft. Bowel sounds are normal. He exhibits no distension and no mass. There is no tenderness. There is no rebound and no guarding.          Assessment & Plan:  It sounds like he had some esophageal spasms last week and not any type of cardiac issue. He probably had some GERD effects so I advised him to go back to taking Nexium daily. He seems to tolerate the loose stools okay so he will follow up with Dr. Cruzita Lederer.

## 2014-10-10 ENCOUNTER — Ambulatory Visit (INDEPENDENT_AMBULATORY_CARE_PROVIDER_SITE_OTHER): Payer: Medicare Other | Admitting: Internal Medicine

## 2014-10-10 ENCOUNTER — Encounter: Payer: Self-pay | Admitting: Internal Medicine

## 2014-10-10 VITALS — BP 118/64 | HR 76 | Temp 98.2°F | Resp 12 | Wt 217.0 lb

## 2014-10-10 DIAGNOSIS — E119 Type 2 diabetes mellitus without complications: Secondary | ICD-10-CM

## 2014-10-10 LAB — HEMOGLOBIN A1C: HEMOGLOBIN A1C: 7.4 % — AB (ref 4.6–6.5)

## 2014-10-10 MED ORDER — METFORMIN HCL ER 500 MG PO TB24: 1000.0000 mg | ORAL_TABLET | Freq: Two times a day (BID) | ORAL | Status: DC

## 2014-10-10 NOTE — Patient Instructions (Signed)
Please stop the regular Metformin and  start Metformin XR 500 mg with dinner x 4 days. If you tolerate this well, add another Metformin tablet (500 mg) with breakfast x 4 days. If you tolerate this well, add another metformin tablet with dinner (total 1000 mg) x 4 days. If you tolerate this well, add another metformin tablet with breakfast (total 1000 mg). Continue with 1000 mg of metformin 2x a day with breakfast and dinner.

## 2014-10-10 NOTE — Progress Notes (Signed)
Patient ID: [name], male   DOB: January 16, 1941, 73 y.o.   MRN: [id number]  HPI: Walter Campbell is a 73 y.o.-year-old male, returning for f/u for DM2, dx 2010, non-insulin-dependent, uncontrolled, without complications. Last visit 4 mo ago.  Last hemoglobin A1c was: Lab Results  Component Value Date   HGBA1C 7.1* 06/07/2014   HGBA1C 7.7* 02/19/2014   HGBA1C 7.3* 06/14/2013   Pt is on a regimen of: - Metformin 500 mg tid >> bid 2/2 soft stools - he takes Imodium q3 days  Pt checks his sugars 1x a day  and they are: - am: 112-160 (ave 144) >> 111-147 >> 113-138 - 2h after b'fast: n/c >> 120-133 >> n/c - before lunch: n/c >> 118-148 (160 x1) >> 96-132, 161 - 2h after lunch: n/c >> 132-148 >> n/c - before dinner: n/c >> 117-138 (160 x1) >> 112-138, 180x1 - 2h after dinner: n/c >> 107-145 >> 142 - bedtime: n/c >> 155, 166 >> n/c - nighttime: n/c No lows. Lowest sugar was 96; he has hypoglycemia awareness at 80.  Highest sugar was 160 >> 180.  He has a Research scientist (medical).  Pt's meals are: - Breakfast: eggs 3x a week + bread; cheese toast; cereals;  - Lunch: ham and cheese sandwich + fruit - Dinner: meat + veg (no starch) or salad; sweet potatoes; rice - Snacks: 2-3: fruit, V8 juice  He exercises: Pilates 2x a week, walks 3x a week 2.2 miles - in 45 min.  - no CKD, last BUN/creatinine:  Lab Results  Component Value Date   BUN 15 02/19/2014   CREATININE 0.8 02/19/2014  He is on Benazepril. - last set of lipids: Lab Results  Component Value Date   CHOL 173 02/19/2014   HDL 53.00 02/19/2014   LDLCALC 101* 02/19/2014   TRIG 95.0 02/19/2014   CHOLHDL 3 02/19/2014  Not on a statin.  - last eye exam was in 11/2013. No DR.  - no numbness and tingling in his feet. He sees a podiatrist.  On ASA 81.  ROS: Constitutional: no weight gain/loss, + decreased appetite, no fatigue, no subjective hyperthermia/hypothermia Eyes: no blurry vision, no xerophthalmia ENT: no sore  throat, no nodules palpated in throat, no dysphagia/odynophagia, no hoarseness Cardiovascular: no CP/SOB/palpitations/leg swelling Respiratory: no cough/SOB Gastrointestinal: no N/V/+D - metformin increased dose/no C/heartburn Musculoskeletal: no muscle/joint pain Skin: no rashes Neurological: no tremors/numbness/tingling/dizziness  I reviewed pt's medications, allergies, PMH, social hx, family hx and no changes required, except as mentioned above. He had R TKR at the beginning of 04/2014.  PE: BP 118/64 mmHg  Pulse 76  Temp(Src) 98.2 F (36.8 C) (Oral)  Resp 12  Wt 217 lb (98.431 kg)  SpO2 97% Body mass index is 32.03 kg/(m^2). Wt Readings from Last 3 Encounters:  10/10/14 217 lb (98.431 kg)  10/02/14 212 lb (96.163 kg)  06/07/14 218 lb (98.884 kg)   Constitutional: overweight, in NAD Eyes: PERRLA, EOMI, no exophthalmos ENT: moist mucous membranes, no thyromegaly, no cervical lymphadenopathy Cardiovascular: RRR, No MRG Respiratory: CTA B Gastrointestinal: abdomen soft, NT, ND, BS+ Musculoskeletal: no deformities, strength intact in all 4 Skin: moist, warm, no rashes Neurological: no tremor with outstretched hands, DTR 2/4 in all 4  ASSESSMENT: 1. DM2, non-insulin-dependent, uncontrolled, without complications  PLAN:  1. Patient with long-standing, recently more controlled diabetes, on minimum oral antidiabetic regimen with Metformin, but he cannot tolerate this well 2/2 loose stools. Sugars improved with increasing metformin, but he can only tolerate  500 mg bid. Will try Metformin XR: Patient Instructions  Please stop the regular Metformin and  start Metformin XR 500 mg with dinner x 4 days. If you tolerate this well, add another Metformin tablet (500 mg) with breakfast x 4 days. If you tolerate this well, add another metformin tablet with dinner (total 1000 mg) x 4 days. If you tolerate this well, add another metformin tablet with breakfast (total 1000 mg). Continue with 1000  mg of metformin 2x a day with breakfast and dinner.  - continue checking sugars at different times of the day - check once a day, rotating checks - he is doing a great job with this - advised for yearly eye exams >> he is up to date - will check HbA1c today - had flu shot this season - Return to clinic in 3 mo with sugar log   Office Visit on 10/10/2014  Component Date Value Ref Range Status  . Hgb A1c MFr Bld 10/10/2014 7.4* 4.6 - 6.5 % Final   Glycemic Control Guidelines for People with Diabetes:Non Diabetic:  <6%Goal of Therapy: <7%Additional Action Suggested:  >8%    Hemoglobin A1c a little higher. Please see plan above to start metformin extended-release and increase the dose as tolerated.

## 2014-10-15 LAB — HM DIABETES EYE EXAM

## 2014-10-25 ENCOUNTER — Encounter: Payer: Self-pay | Admitting: Internal Medicine

## 2014-11-12 ENCOUNTER — Encounter: Payer: Self-pay | Admitting: Internal Medicine

## 2014-11-26 ENCOUNTER — Encounter: Payer: Self-pay | Admitting: Family Medicine

## 2014-11-26 DIAGNOSIS — E785 Hyperlipidemia, unspecified: Secondary | ICD-10-CM | POA: Insufficient documentation

## 2014-11-27 ENCOUNTER — Ambulatory Visit (INDEPENDENT_AMBULATORY_CARE_PROVIDER_SITE_OTHER): Payer: 59 | Admitting: Family Medicine

## 2014-11-27 ENCOUNTER — Encounter: Payer: Self-pay | Admitting: Family Medicine

## 2014-11-27 VITALS — BP 120/64 | Temp 98.0°F | Wt 216.0 lb

## 2014-11-27 DIAGNOSIS — K219 Gastro-esophageal reflux disease without esophagitis: Secondary | ICD-10-CM

## 2014-11-27 DIAGNOSIS — F411 Generalized anxiety disorder: Secondary | ICD-10-CM | POA: Insufficient documentation

## 2014-11-27 DIAGNOSIS — I10 Essential (primary) hypertension: Secondary | ICD-10-CM

## 2014-11-27 DIAGNOSIS — E785 Hyperlipidemia, unspecified: Secondary | ICD-10-CM

## 2014-11-27 DIAGNOSIS — Z23 Encounter for immunization: Secondary | ICD-10-CM

## 2014-11-27 MED ORDER — ATORVASTATIN CALCIUM 20 MG PO TABS: ORAL_TABLET | ORAL | Status: DC

## 2014-11-27 NOTE — Patient Instructions (Addendum)
With your family history of heart disease, your elevated inflammation marker, your slight plaque in your carotids, we opted to try a statin to lower cholesterol and inflammation. Start with once a week and see how your sugars do and if you have muscle aches. If you do well, message me and we can go up to twice a week.   Stop felodipine. Check blood pressure and if ever above 140/90 then you need to restart.   Schedule next available annual physical after July 1st.

## 2014-11-27 NOTE — Assessment & Plan Note (Signed)
GERD has been controlled with prn pepcid. He has had no recurrence of "catching" in epigastric area thought to be palpitations by Dr. Sarajane Jews. No exertional component and usually occurs with rest and relieved by movement so very low likelihood cardiac. Prn follow up especially if worsens or change in characteristics. Continue off PPI for now with only prn h2 blocker

## 2014-11-27 NOTE — Progress Notes (Signed)
Walter Reddish, MD Phone: [phone]  Subjective:  Patient presents today to establish care with me as their new primary care provider. Patient was formerly a patient of Dr. Leanne Chang. Chief complaint-noted.   Hyperlipidemia-poor control  Lab Results  Component Value Date   LDLCALC 101* 02/19/2014  Recent life screening showed mild carotid stenosis and elevated CRP around 5 advising high risk CV disease. Patient very concerned about statin side effects and interactions between medications On statin: no Regular exercise: some, advised increase Diet: some poor choices ROS- no chest pain or shortness of breath. No myalgias  Hypertension-controlled  On benazepril hctz 20-25mg  and felodipine 5mg  BP Readings from Last 3 Encounters:  11/27/14 120/64  10/10/14 118/64  10/02/14 138/81  Home BP monitoring-yes in 120s over 70s Compliant with medications-yes without side effects ROS-Denies any CP, HA, SOB, blurry vision, LE edema  GERD/esophageal spasms Saw GI after seeing Dr. Clarene Duke 10/02/14 with concern of esophageal spasms. He was changed from nexium to protonix. Sugars elevated. Sugars came down off protonix. He has continued off PPI and takes occasional pepcid with good control. He would have a few seconds of "catch" in his chest thought to be palpitations which resolved on daily PPI and have not recurred despite coming off. Absolutely no exertional chest pain or palpitations in fact movement makes catching go away.  ROS- no shortness of breath, dyspnea on exertion.   Past medical history: Patient Active Problem List   Diagnosis Date Noted  . Type 2 diabetes mellitus without complications 96/22/2979    Priority: High  . Hyperlipidemia 11/26/2014    Priority: Medium  . Essential hypertension 12/04/2009    Priority: Medium  . Anxiety state 11/27/2014    Priority: Low  . GERD (gastroesophageal reflux disease) 11/14/2012    Priority: Low    Medications- reviewed and updated Current  Outpatient Prescriptions  Medication Sig Dispense Refill  . aspirin 81 MG tablet Take 81 mg by mouth daily.      . benazepril-hydrochlorthiazide (LOTENSIN HCT) 20-25 MG per tablet TAKE 1 TABLET EVERY DAY 90 tablet 1  . felodipine (PLENDIL) 5 MG 24 hr tablet Take 1 tablet (5 mg total) by mouth daily. 90 tablet 2  . metFORMIN (GLUCOPHAGE-XR) 500 MG 24 hr tablet Take 2 tablets (1,000 mg total) by mouth 2 (two) times daily with a meal. 360 tablet 1  . ONE TOUCH ULTRA TEST test strip USE AS INSTRUCTED 100 each 0  . atorvastatin (LIPITOR) 20 MG tablet Twice a week (Mon, fridays) 30 tablet 3  . pantoprazole (PROTONIX) 40 MG tablet (NOT TAKING at present-only prn pepcid) Take 40 mg by mouth daily.     ROS--See HPI   Objective: BP 120/64 mmHg  Temp(Src) 98 F (36.7 C)  Wt 216 lb (97.977 kg) Gen: NAD, resting comfortably CV: RRR no murmurs rubs or gallops Lungs: CTAB no crackles, wheeze, rhonchi Abdomen: soft/nontender/nondistended/normal bowel sounds. No rebound or guarding.  Ext: no edema Skin: warm, dry, no rash  Neuro: grossly normal, moves all extremities, normal gait  Assessment/Plan:  Hyperlipidemia We reviewed Recent life screening showed mild carotid stenosis and elevated CRP around 5 advising high risk CV disease. Patient very worried about cardiac risk. Myoview stress test 2012 was normal but dad with history MI and patient with HTn, HLD mild, DM. I advised him a statin would be indicated but he is concerned of risk. We opted for atorvastatin 20mg  once weekly to start and up to twice a week if no side effects or  elevation in CBGs. He will update me.    Essential hypertension Patient would like to trial off of felodipine and with last 2 BP readings think this is reasonable. Advised in office check but he opts to monitor at home and return/restart medication if above 140/90.    GERD (gastroesophageal reflux disease) GERD has been controlled with prn pepcid. He has had no recurrence  of "catching" in epigastric area thought to be palpitations by Dr. Sarajane Jews. No exertional component and usually occurs with rest and relieved by movement so very low likelihood cardiac. Prn follow up especially if worsens or change in characteristics. Continue off PPI for now with only prn h2 blocker    Return precautions advised. Follow up in July for physical. We did not review his full history outside of meds and problem list/past medical history so will review in July.   Orders Placed This Encounter  Procedures  . Pneumococcal conjugate vaccine 13-valent    Meds ordered this encounter  . atorvastatin (LIPITOR) 20 MG tablet    Sig: Twice a week (Mon, fridays)    Dispense:  30 tablet    Refill:  3

## 2014-11-27 NOTE — Assessment & Plan Note (Signed)
We reviewed Recent life screening showed mild carotid stenosis and elevated CRP around 5 advising high risk CV disease. Patient very worried about cardiac risk. Myoview stress test 2012 was normal but dad with history MI and patient with HTn, HLD mild, DM. I advised him a statin would be indicated but he is concerned of risk. We opted for atorvastatin 20mg  once weekly to start and up to twice a week if no side effects or elevation in CBGs. He will update me.

## 2014-11-27 NOTE — Assessment & Plan Note (Signed)
Patient would like to trial off of felodipine and with last 2 BP readings think this is reasonable. Advised in office check but he opts to monitor at home and return/restart medication if above 140/90.

## 2014-11-29 ENCOUNTER — Telehealth: Payer: Self-pay | Admitting: *Deleted

## 2014-11-29 NOTE — Telephone Encounter (Signed)
Pt walked in today.  He had a pneumonia shot done in his left arm on Tuesday 11/27/14 and his arm has a knot, it's red, and warm to the touch.  He states it is painful.  He has not put any ice on it.  He just wanted to make sure that it's normal and won't get worse.  Told pt it looked like a local reaction.  Dr Yong Channel took a quick look at it and informed pt that it felt like pt had developed a small hematoma and would go away.  He instructed pt to ice it three times a day for about 10-15 mins and be seen if it got worse.  Pt verbalized understanding and had no questions.  Nothing further is needed at this time

## 2014-12-19 ENCOUNTER — Ambulatory Visit: Payer: 59

## 2014-12-21 ENCOUNTER — Ambulatory Visit: Payer: Medicare Other | Attending: Orthopedic Surgery

## 2014-12-21 ENCOUNTER — Ambulatory Visit: Payer: 59 | Admitting: Physical Therapy

## 2014-12-21 DIAGNOSIS — E119 Type 2 diabetes mellitus without complications: Secondary | ICD-10-CM | POA: Insufficient documentation

## 2014-12-21 DIAGNOSIS — M25562 Pain in left knee: Secondary | ICD-10-CM | POA: Diagnosis not present

## 2014-12-21 DIAGNOSIS — F411 Generalized anxiety disorder: Secondary | ICD-10-CM | POA: Diagnosis not present

## 2014-12-21 DIAGNOSIS — K219 Gastro-esophageal reflux disease without esophagitis: Secondary | ICD-10-CM | POA: Insufficient documentation

## 2014-12-21 DIAGNOSIS — E785 Hyperlipidemia, unspecified: Secondary | ICD-10-CM | POA: Insufficient documentation

## 2014-12-21 DIAGNOSIS — M6281 Muscle weakness (generalized): Secondary | ICD-10-CM | POA: Diagnosis not present

## 2014-12-21 DIAGNOSIS — I10 Essential (primary) hypertension: Secondary | ICD-10-CM | POA: Insufficient documentation

## 2014-12-24 ENCOUNTER — Ambulatory Visit: Payer: Medicare Other | Admitting: Physical Therapy

## 2014-12-26 ENCOUNTER — Encounter: Payer: Self-pay | Admitting: Physical Therapy

## 2014-12-26 ENCOUNTER — Ambulatory Visit: Payer: Medicare Other | Admitting: Physical Therapy

## 2014-12-26 DIAGNOSIS — M6281 Muscle weakness (generalized): Secondary | ICD-10-CM

## 2014-12-26 NOTE — Therapy (Signed)
[facility] [address] [place], [place], [place], [address] Phone: [phone]   Fax:  [phone]  Physical Therapy Treatment  Patient Details  Name: Walter Campbell MRN: [id number] Date of Birth: 03/13/1941 Referring Provider:  Marin Olp, MD  Encounter Date: 12/26/2014      PT End of Session - 12/26/14 0921    Visit Number 2   Number of Visits 10   Date for PT Re-Evaluation 02/01/15   PT Start Time 0845   PT Stop Time 0925   PT Time Calculation (min) 40 min   Activity Tolerance Patient tolerated treatment well   Behavior During Therapy [facility] for tasks assessed/performed      Past Medical History  Diagnosis Date  . Diabetes mellitus   . Hypertension   . Palpitations 06/10/2010    History reviewed. No pertinent past surgical history.  There were no vitals taken for this visit.  Visit Diagnosis:  Generalized muscle weakness      Subjective Assessment - 12/26/14 0849    Symptoms Just dull ache   Currently in Pain? Yes   Pain Score 2    Pain Location Knee   Pain Orientation Left   Pain Descriptors / Indicators Aching   Aggravating Factors  Stairs   Pain Relieving Factors ice   Multiple Pain Sites No          [facility] PT Assessment - 12/26/14 0001    Assessment   Medical Diagnosis S/P LT knee scope with medial menisectomy   Precautions   Precautions None   Balance Screen   Has the patient fallen in the past 6 months No                  [facility] Adult PT Treatment/Exercise - 12/26/14 0001    Exercises   Exercises Knee/Hip   Knee/Hip Exercises: Stretches   Active Hamstring Stretch 3 reps;30 seconds   Hip Flexor Stretch 3 reps;60 seconds   Knee/Hip Exercises: Aerobic   Stationary Bike L1 10 min   Knee/Hip Exercises: Standing   Knee Flexion 2 sets;10 reps  2#   Forward Step Up 3 sets;10 reps   Rocker Board 3 minutes  standing   Walking with Sports Cord 3 plates 10x each direction    Knee/Hip Exercises: Seated   Long Arc Quad 2 sets;10 reps   Long Arc Quad Weight 2 lbs.   Knee/Hip Exercises: Supine   Straight Leg Raises 3 sets;10 reps  2#                  PT Short Term Goals - 12/26/14 0926    PT SHORT TERM GOAL #1   Title Be independent in HEP   Time 4   Period Weeks   Status New   PT SHORT TERM GOAL #2   Title Report 30% reduction in left knee pain with standing and walking   Time 4   Period Weeks   Status New   PT SHORT TERM GOAL #3   Title Ambulate in community without limitation   Time 4   Period Weeks   Status New           PT Long Term Goals - 12/26/14 1829    PT LONG TERM GOAL #1   Title Verbalize understanding of RICE method   Time 8   Period Weeks   Status Achieved   PT LONG TERM GOAL #2   Title Independent in advanced HEP  Time 8   Period Weeks   Status New   PT LONG TERM GOAL #3   Title Reduce FOTO to< or = to 39% limitation   Time 8   Period Weeks   Status New   PT LONG TERM GOAL #4   Title Demonstrate symmetry with ambulation on level surface   Time 8   Period Weeks   PT LONG TERM GOAL #5   Title Report 75% reduction in LT knee pain with standing and walking   Time 8   Period Weeks   Status New   Additional Long Term Goals   Additional Long Term Goals Yes   PT LONG TERM GOAL #6   Title Retrun to playing golf without limitation   Time 0   Period Weeks   Status New               Plan - 12/26/14 6415    PT Frequency 2x / week   PT Duration 8 weeks        Problem List Patient Active Problem List   Diagnosis Date Noted  . Anxiety state 11/27/2014  . Hyperlipidemia 11/26/2014  . GERD (gastroesophageal reflux disease) 11/14/2012  . Type 2 diabetes mellitus without complications 83/07/4075  . Essential hypertension 12/04/2009    Hajer Dwyer, PTA 12/26/2014, 9:41 AM  [facility] [place], [place], [place],  [address] Phone: [phone]   Fax:  [phone]

## 2014-12-31 ENCOUNTER — Ambulatory Visit: Payer: Medicare Other

## 2014-12-31 DIAGNOSIS — M6281 Muscle weakness (generalized): Secondary | ICD-10-CM | POA: Diagnosis not present

## 2014-12-31 DIAGNOSIS — M25562 Pain in left knee: Secondary | ICD-10-CM

## 2014-12-31 NOTE — Therapy (Signed)
[facility] [address] [place], [place], [place], [address] Phone: [phone]   Fax:  [phone]  Physical Therapy Treatment  Patient Details  Name: Walter Campbell MRN: [id number] Date of Birth: 03-Mar-1941 Referring Provider:  Marin Olp, MD  Encounter Date: 12/31/2014      PT End of Session - 12/31/14 1327    Visit Number 3   Number of Visits --  Medicare 10   Date for PT Re-Evaluation 02/01/15   PT Start Time 9741   PT Stop Time 1359   PT Time Calculation (min) 44 min   Activity Tolerance Patient tolerated treatment well   Behavior During Therapy [facility] for tasks assessed/performed      Past Medical History  Diagnosis Date  . Diabetes mellitus   . Hypertension   . Palpitations 06/10/2010    History reviewed. No pertinent past surgical history.  There were no vitals taken for this visit.  Visit Diagnosis:  Generalized muscle weakness  Knee pain, acute, left      Subjective Assessment - 12/31/14 1317    Symptoms Did a lot of walking on Friday and it made my knee more painful.    Currently in Pain? Yes   Pain Score 3    Pain Location Knee   Pain Orientation Left   Pain Descriptors / Indicators Aching   Pain Type Surgical pain   Pain Onset 1 to 4 weeks ago   Pain Frequency Intermittent   Aggravating Factors  Increased walking, steps at home   Pain Relieving Factors ice, rest   Effect of Pain on Daily Activities pain after walking/standing long periods   Multiple Pain Sites No                    [facility] Adult PT Treatment/Exercise - 12/31/14 0001    Knee/Hip Exercises: Aerobic   Stationary Bike Level 3x 10 minutes   Knee/Hip Exercises: Standing   Lateral Step Up Left;2 sets;15 reps   Step Down 2 sets;15 reps;Left   Walking with Sports Cord 25#-10x each direction   Other Standing Knee Exercises mini tramp- weight shifting 3 ways x 1 min each   Knee/Hip Exercises: Seated   Long Arc  Quad 2 sets;10 reps   Long Arc Quad Weight 2 lbs.   Modalities   Modalities Ultrasound   Ultrasound   Ultrasound Location Lt knee    Ultrasound Parameters 1.3 w/cm 50% pulsed x 8 minutes   Ultrasound Goals Pain;Edema                  PT Short Term Goals - 12/31/14 1320    PT SHORT TERM GOAL #1   Title Be independent in HEP   Status Achieved  independent and compliant   PT SHORT TERM GOAL #2   Title Report 30% reduction in left knee pain with standing and walking   Status On-going  pain is "about the same"   PT SHORT TERM GOAL #3   Title Ambulate in community without limitation   Status Achieved  able to walk unlimited distance, just has pain           PT Long Term Goals - 12/26/14 0928    PT LONG TERM GOAL #1   Title Verbalize understanding of RICE method   Time 8   Period Weeks   Status Achieved   PT LONG TERM GOAL #2   Title Independent in advanced HEP   Time 8  Period Weeks   Status New   PT LONG TERM GOAL #3   Title Reduce FOTO to< or = to 39% limitation   Time 8   Period Weeks   Status New   PT LONG TERM GOAL #4   Title Demonstrate symmetry with ambulation on level surface   Time 8   Period Weeks   PT LONG TERM GOAL #5   Title Report 75% reduction in LT knee pain with standing and walking   Time 8   Period Weeks   Status New   Additional Long Term Goals   Additional Long Term Goals Yes   PT LONG TERM GOAL #6   Title Retrun to playing golf without limitation   Time 0   Period Weeks   Status New               Plan - 12/31/14 1328    Clinical Impression Statement Pt is able to tolerate increased activity in the clinic without limitation.  Pain remains the same.  Goals assessed and pt making progress toward STGs.     Pt will benefit from skilled therapeutic intervention in order to improve on the following deficits Difficulty walking;Decreased endurance;Pain   Rehab Potential Good   PT Frequency 2x / week   PT Duration 8 weeks    PT Treatment/Interventions Therapeutic activities;Therapeutic exercise;Ultrasound;Gait training;Electrical Stimulation;Functional mobility training;Manual techniques   PT Next Visit Plan Continue with knee strength exercises, modalities and manual as needed.   PT Home Exercise Plan add step-downs and lateral step-ups to HEP   Consulted and Agree with Plan of Care Patient        Problem List Patient Active Problem List   Diagnosis Date Noted  . Anxiety state 11/27/2014  . Hyperlipidemia 11/26/2014  . GERD (gastroesophageal reflux disease) 11/14/2012  . Type 2 diabetes mellitus without complications 12/45/8099  . Essential hypertension 12/04/2009    TAKACS,KELLY , PT  12/31/2014, 2:01 PM  [facility] [address] [place], [place], [place], [address] Phone: [phone]   Fax:  [phone]

## 2015-01-02 ENCOUNTER — Encounter: Payer: Self-pay | Admitting: Physical Therapy

## 2015-01-02 ENCOUNTER — Ambulatory Visit: Payer: Medicare Other | Admitting: Physical Therapy

## 2015-01-02 DIAGNOSIS — M6281 Muscle weakness (generalized): Secondary | ICD-10-CM | POA: Diagnosis not present

## 2015-01-02 DIAGNOSIS — M25562 Pain in left knee: Secondary | ICD-10-CM

## 2015-01-02 NOTE — Therapy (Signed)
[facility] [address] [place], [place], [address] Phone: [phone]   Fax:  [phone]  Physical Therapy Treatment  Patient Details  Name: Walter Campbell MRN: [id number] Date of Birth: 25-Sep-1941 Referring Provider:  Marin Olp, MD  Encounter Date: 01/02/2015      PT End of Session - 01/02/15 0927    Visit Number 4   Date for PT Re-Evaluation 02/01/15   PT Start Time 0845   PT Stop Time 0930   PT Time Calculation (min) 45 min   Activity Tolerance Patient tolerated treatment well   Behavior During Therapy [facility] for tasks assessed/performed      Past Medical History  Diagnosis Date  . Diabetes mellitus   . Hypertension   . Palpitations 06/10/2010    History reviewed. No pertinent past surgical history.  There were no vitals taken for this visit.  Visit Diagnosis:  Generalized muscle weakness  Knee pain, acute, left      Subjective Assessment - 01/02/15 0847    Symptoms Knee is stiff this AM.   Currently in Pain? Yes   Pain Score 1    Pain Location Knee   Pain Orientation Left   Pain Descriptors / Indicators Nagging   Pain Type Surgical pain   Aggravating Factors  Steps   Pain Relieving Factors ice and rest   Multiple Pain Sites No          [facility] PT Assessment - 01/02/15 0001    AROM   Overall AROM  --  2-133 degrees Active   Strength   Overall Strength --  Quads 4+/5                  [facility] Adult PT Treatment/Exercise - 01/02/15 0001    Knee/Hip Exercises: Aerobic   Stationary Bike Level 3x 10 minutes   Knee/Hip Exercises: Standing   Lateral Step Up Left;2 sets;15 reps   Forward Step Up 2 sets;15 reps   Step Down --  12x   Rocker Board 3 minutes   Walking with Sports Cord 25#-10x each direction   Knee/Hip Exercises: Seated   Long Arc Quad Left;2 sets;10 reps   Ultrasound   Ultrasound Location LT knee   Ultrasound Parameters 1.3 w/c2   Ultrasound Goals Pain                   PT Short Term Goals - 12/31/14 1320    PT SHORT TERM GOAL #1   Title Be independent in HEP   Status Achieved  independent and compliant   PT SHORT TERM GOAL #2   Title Report 30% reduction in left knee pain with standing and walking   Status On-going  pain is "about the same"   PT SHORT TERM GOAL #3   Title Ambulate in community without limitation   Status Achieved  able to walk unlimited distance, just has pain           PT Long Term Goals - 12/26/14 0928    PT LONG TERM GOAL #1   Title Verbalize understanding of RICE method   Time 8   Period Weeks   Status Achieved   PT LONG TERM GOAL #2   Title Independent in advanced HEP   Time 8   Period Weeks   Status New   PT LONG TERM GOAL #3   Title Reduce FOTO to< or = to 39% limitation   Time 8   Period Weeks  Status New   PT LONG TERM GOAL #4   Title Demonstrate symmetry with ambulation on level surface   Time 8   Period Weeks   PT LONG TERM GOAL #5   Title Report 75% reduction in LT knee pain with standing and walking   Time 8   Period Weeks   Status New   Additional Long Term Goals   Additional Long Term Goals Yes   PT LONG TERM GOAL #6   Title Retrun to playing golf without limitation   Time 0   Period Weeks   Status New               Plan - 01/02/15 0600    Clinical Impression Statement Increased wts today, pain slightly down today, pt remains very active.   Pt will benefit from skilled therapeutic intervention in order to improve on the following deficits Difficulty walking;Decreased endurance;Pain   Rehab Potential Good   PT Frequency 2x / week   PT Duration 8 weeks   PT Treatment/Interventions Therapeutic activities;Therapeutic exercise;Ultrasound;Gait training;Electrical Stimulation;Functional mobility training;Manual techniques   PT Next Visit Plan Continue with knee strength exercises, modalities and manual as needed.   PT Home Exercise Plan add step-downs and  lateral step-ups to HEP   Consulted and Agree with Plan of Care Patient        Problem List Patient Active Problem List   Diagnosis Date Noted  . Anxiety state 11/27/2014  . Hyperlipidemia 11/26/2014  . GERD (gastroesophageal reflux disease) 11/14/2012  . Type 2 diabetes mellitus without complications 45/99/7741  . Essential hypertension 12/04/2009    Toyna Erisman , PTA  01/02/2015, 9:30 AM  [facility] [address] [place], [place], [address] Phone: [phone]   Fax:  [phone]

## 2015-01-07 ENCOUNTER — Ambulatory Visit: Payer: Medicare Other | Admitting: Physical Therapy

## 2015-01-07 ENCOUNTER — Encounter: Payer: Self-pay | Admitting: Physical Therapy

## 2015-01-07 DIAGNOSIS — M25562 Pain in left knee: Secondary | ICD-10-CM

## 2015-01-07 DIAGNOSIS — M6281 Muscle weakness (generalized): Secondary | ICD-10-CM

## 2015-01-07 NOTE — Therapy (Signed)
[facility] [address] [place], [place], [address] Phone: [phone]   Fax:  [phone]  Physical Therapy Treatment  Patient Details  Name: Walter Campbell MRN: [id number] Date of Birth: [birth year] Referring Provider:  Marin Olp, MD  Encounter Date: 01/07/2015      PT End of Session - 01/07/15 1527    Visit Number 6   Date for PT Re-Evaluation 02/01/15   PT Start Time 1450   PT Stop Time 1530   PT Time Calculation (min) 40 min   Activity Tolerance Patient tolerated treatment well   Behavior During Therapy [facility] for tasks assessed/performed      Past Medical History  Diagnosis Date  . Diabetes mellitus   . Hypertension   . Palpitations 06/10/2010    History reviewed. No pertinent past surgical history.  There were no vitals taken for this visit.  Visit Diagnosis:  Generalized muscle weakness  Knee pain, acute, left      Subjective Assessment - 01/07/15 1455    Symptoms [place] and this hurt the knee. Anything over 1 hr increases ache.   How long can you walk comfortably? 1 hour   Currently in Pain? Yes   Pain Score 2    Pain Location Knee   Pain Orientation Left   Pain Descriptors / Indicators Aching   Pain Type Surgical pain   Aggravating Factors  Walking > hour, stairs   Pain Relieving Factors ice, rest   Multiple Pain Sites No                    [facility] Adult PT Treatment/Exercise - 01/07/15 0001    Ambulation/Gait   Stairs --  Reciprocal with light rails 4x   Knee/Hip Exercises: Stretches   Active Hamstring Stretch 3 reps;30 seconds   Knee/Hip Exercises: Aerobic   Stationary Bike Level 3x 10 minutes   Knee/Hip Exercises: Standing   Rocker Board 3 minutes   Walking with Sports Cord 25#-10x each direction   Knee/Hip Exercises: Seated   Long Arc Quad Left;2 sets;10 reps   Long Arc Quad Weight 3 lbs.                  PT Short  Term Goals - 01/07/15 1516    PT SHORT TERM GOAL #1   Title Be independent in HEP   Time 4   Period Weeks   Status Achieved   PT SHORT TERM GOAL #2   Title Report 30% reduction in left knee pain with standing and walking   Time 4   Period Weeks   Status Achieved  50%   PT SHORT TERM GOAL #3   Title Ambulate in community without limitation   Time 4   Period Weeks   Status Achieved           PT Long Term Goals - 01/07/15 1518    PT LONG TERM GOAL #1   Title Verbalize understanding of RICE method   Time 8   Period Weeks   Status Achieved   PT LONG TERM GOAL #2   Title Independent in advanced HEP   Time 8   Period Weeks   Status On-going   PT LONG TERM GOAL #3   Title Reduce FOTO to< or = to 39% limitation   Time 8   Period Weeks   Status --  Will do on visit 10   PT LONG TERM GOAL #4  Title Demonstrate symmetry with ambulation on level surface   Time 8   Period Weeks   Status On-going  Mild limp due to pain   PT LONG TERM GOAL #5   Title Report 75% reduction in LT knee pain with standing and walking   Time 8   Period Weeks   Status On-going  50% decreased   PT LONG TERM GOAL #6   Title Retrun to playing golf without limitation   Time 0   Period Weeks   Status New               Plan - 01/07/15 1521    Clinical Impression Statement Increased wt on LAQ. Declined [place] today. Able to demonstrate stairs reciprocally wihtout much pain.   Pt will benefit from skilled therapeutic intervention in order to improve on the following deficits Difficulty walking;Decreased endurance;Pain   Rehab Potential Good   PT Frequency 2x / week   PT Duration 8 weeks   PT Treatment/Interventions Therapeutic activities;Therapeutic exercise;Ultrasound;Gait training;Electrical Stimulation;Functional mobility training;Manual techniques   PT Next Visit Plan Continue with knee strength exercises, modalities and manual as needed.   Consulted and Agree with Plan of Care Patient         Problem List Patient Active Problem List   Diagnosis Date Noted  . Anxiety state 11/27/2014  . Hyperlipidemia 11/26/2014  . GERD (gastroesophageal reflux disease) 11/14/2012  . Type 2 diabetes mellitus without complications 41/93/7902  . Essential hypertension 12/04/2009    Braidan Ricciardi,PTA 01/07/2015, 3:28 PM  [facility] [address] [place], [place], [address] Phone: [phone]   Fax:  [phone]

## 2015-01-09 ENCOUNTER — Encounter: Payer: 59 | Admitting: Physical Therapy

## 2015-01-10 ENCOUNTER — Ambulatory Visit: Payer: Medicare Other | Attending: Orthopedic Surgery

## 2015-01-10 DIAGNOSIS — E119 Type 2 diabetes mellitus without complications: Secondary | ICD-10-CM | POA: Insufficient documentation

## 2015-01-10 DIAGNOSIS — F411 Generalized anxiety disorder: Secondary | ICD-10-CM | POA: Insufficient documentation

## 2015-01-10 DIAGNOSIS — M25562 Pain in left knee: Secondary | ICD-10-CM | POA: Insufficient documentation

## 2015-01-10 DIAGNOSIS — I10 Essential (primary) hypertension: Secondary | ICD-10-CM | POA: Insufficient documentation

## 2015-01-10 DIAGNOSIS — M6281 Muscle weakness (generalized): Secondary | ICD-10-CM

## 2015-01-10 DIAGNOSIS — E785 Hyperlipidemia, unspecified: Secondary | ICD-10-CM | POA: Insufficient documentation

## 2015-01-10 DIAGNOSIS — K219 Gastro-esophageal reflux disease without esophagitis: Secondary | ICD-10-CM | POA: Insufficient documentation

## 2015-01-10 NOTE — Patient Instructions (Signed)
  Step-Up: Lateral   Step up to side with left leg. Bring other foot up onto __6-8__ inch step. Return to floor position with left leg. Repeat __10-20__ times per session. Do _1-2___ sessions per day. Repeat in dimly lit room. Repeat with eyes closed.  Copyright  VHI. All rights reserved.  Forward   Facing step, place one leg on step, flexed at hip. Step up slowly, bringing hips in line with knee and shoulder. Bring other foot onto step. Reverse process to step back down. Repeat with other leg. Do __10-20__ repetitions, __1-2__ sets.  http://bt.exer.us/154   Copyright  VHI. All rights reserved

## 2015-01-10 NOTE — Therapy (Signed)
[facility] [address] [place], [place], [address] Phone: [phone]   Fax:  [phone]  Physical Therapy Treatment  Patient Details  Name: Walter Campbell MRN: [id number] Date of Birth: 08-13-1941 Referring Provider:  Marin Olp, MD  Encounter Date: 01/10/2015      PT End of Session - 01/10/15 0758    Visit Number 7   Number of Visits 10  Medicare   Date for PT Re-Evaluation 02/01/15   PT Start Time 0730   PT Stop Time 0804   PT Time Calculation (min) 34 min   Activity Tolerance Patient tolerated treatment well   Behavior During Therapy [facility] for tasks assessed/performed      Past Medical History  Diagnosis Date  . Diabetes mellitus   . Hypertension   . Palpitations 06/10/2010    History reviewed. No pertinent past surgical history.  There were no vitals taken for this visit.  Visit Diagnosis:  Generalized muscle weakness  Knee pain, acute, left      Subjective Assessment - 01/10/15 0737    Symptoms Pt wants to buy a recumbent bike for use at home.   Currently in Pain? Yes   Pain Score 3    Pain Location Knee   Pain Orientation Left   Pain Descriptors / Indicators Aching;Dull;Tightness   Pain Type Surgical pain   Pain Onset 1 to 4 weeks ago   Pain Frequency Intermittent   Effect of Pain on Daily Activities Pain with long periods of walking   Multiple Pain Sites No                    [facility] Adult PT Treatment/Exercise - 01/10/15 0001    Knee/Hip Exercises: Aerobic   Stationary Bike Level 3x 10 minutes   Knee/Hip Exercises: Machines for Strengthening   Cybex Leg Press 90# bil. 3x10, single leg 70# 3x10  seat 7   Knee/Hip Exercises: Standing   Lateral Step Up Left;2 sets;15 reps  Added to HEP   Forward Step Up 2 sets;15 reps  added to HEP   SLS red theraband around Rt LE with hip abduction and extension 2x10   Walking with Sports Cord 30#-10x each direction                 PT Education - 01/10/15 0801    Education provided Yes   Education Details HEP-forward and lateral step up   Person(s) Educated Patient   Methods Explanation;Demonstration;Handout   Comprehension Verbalized understanding;Returned demonstration          PT Short Term Goals - 01/07/15 1516    PT SHORT TERM GOAL #1   Title Be independent in HEP   Time 4   Period Weeks   Status Achieved   PT SHORT TERM GOAL #2   Title Report 30% reduction in left knee pain with standing and walking   Time 4   Period Weeks   Status Achieved  50%   PT SHORT TERM GOAL #3   Title Ambulate in community without limitation   Time 4   Period Weeks   Status Achieved           PT Long Term Goals - 01/07/15 1518    PT LONG TERM GOAL #1   Title Verbalize understanding of RICE method   Time 8   Period Weeks   Status Achieved   PT LONG TERM GOAL #2   Title Independent in advanced HEP  Time 8   Period Weeks   Status On-going   PT LONG TERM GOAL #3   Title Reduce FOTO to< or = to 39% limitation   Time 8   Period Weeks   Status --  Will do on visit 10   PT LONG TERM GOAL #4   Title Demonstrate symmetry with ambulation on level surface   Time 8   Period Weeks   Status On-going  Mild limp due to pain   PT LONG TERM GOAL #5   Title Report 75% reduction in LT knee pain with standing and walking   Time 8   Period Weeks   Status On-going  50% decreased   PT LONG TERM GOAL #6   Title Retrun to playing golf without limitation   Time 0   Period Weeks   Status New               Plan - 01/10/15 0751    Clinical Impression Statement Pt tolerated increased weight and new exercises well.  Pt with only mild and aching pain in the Lt knee.    Pt will benefit from skilled therapeutic intervention in order to improve on the following deficits Difficulty walking;Decreased endurance;Pain   Rehab Potential Good   PT Frequency 2x / week   PT Duration 8 weeks   PT  Treatment/Interventions Therapeutic activities;Therapeutic exercise;Ultrasound;Gait training;Electrical Stimulation;Functional mobility training;Manual techniques   PT Next Visit Plan Continue with Lt knee strength and proprioception   PT Home Exercise Plan review new HEP   Consulted and Agree with Plan of Care Patient        Problem List Patient Active Problem List   Diagnosis Date Noted  . Anxiety state 11/27/2014  . Hyperlipidemia 11/26/2014  . GERD (gastroesophageal reflux disease) 11/14/2012  . Type 2 diabetes mellitus without complications 98/26/4158  . Essential hypertension 12/04/2009    Renise Gillies, PT 01/10/2015, 8:05 AM  [facility] [address] [place], [place], [address] Phone: [phone]   Fax:  [phone]

## 2015-01-14 ENCOUNTER — Ambulatory Visit: Payer: Medicare Other | Admitting: Physical Therapy

## 2015-01-14 ENCOUNTER — Encounter: Payer: Self-pay | Admitting: Physical Therapy

## 2015-01-14 DIAGNOSIS — M6281 Muscle weakness (generalized): Secondary | ICD-10-CM

## 2015-01-14 DIAGNOSIS — M25562 Pain in left knee: Secondary | ICD-10-CM

## 2015-01-14 NOTE — Therapy (Signed)
[facility] [address] [place], [place], [address] Phone: [phone]   Fax:  [phone]  Physical Therapy Treatment  Patient Details  Name: Walter Campbell MRN: [id number] Date of Birth: 1941/07/12 Referring Provider:  Marin Olp, MD  Encounter Date: 01/14/2015      PT End of Session - 01/14/15 1050    Visit Number 8   Number of Visits 10   Date for PT Re-Evaluation 02/01/15   PT Start Time 6606   PT Stop Time 1050   PT Time Calculation (min) 35 min   Activity Tolerance Patient tolerated treatment well   Behavior During Therapy [facility] for tasks assessed/performed      Past Medical History  Diagnosis Date  . Diabetes mellitus   . Hypertension   . Palpitations 06/10/2010    History reviewed. No pertinent past surgical history.  There were no vitals taken for this visit.  Visit Diagnosis:  Generalized muscle weakness  Knee pain, acute, left      Subjective Assessment - 01/14/15 1017    Symptoms HAd a lot of medial knee/ quad pain/ burning after last tx. Thinks it was doing single leg on leg press. Might have found bike for home.    How long can you walk comfortably? 1 hour   Currently in Pain? Yes   Pain Score 2    Pain Location Knee   Pain Orientation Left   Pain Descriptors / Indicators Aching;Nagging;Dull   Pain Type Surgical pain   Aggravating Factors  Walking longer than 1 hour   Pain Relieving Factors Ice, rest   Multiple Pain Sites No                    [facility] Adult PT Treatment/Exercise - 01/14/15 0001    Knee/Hip Exercises: Aerobic   Stationary Bike Level 3x 10 minutes   Knee/Hip Exercises: Machines for Strengthening   Cybex Leg Press --  Seat #7 Bil 90# 3x10,    Knee/Hip Exercises: Standing   Rocker Board 3 minutes   Walking with Sports Cord 30#-10x each direction   Knee/Hip Exercises: Seated   Long Arc Quad Strengthening;Left;3 sets;10 reps   Long Arc Quad  Weight 3 lbs.                  PT Short Term Goals - 01/14/15 1052    PT SHORT TERM GOAL #1   Title Be independent in HEP   Time 4   Period Weeks   Status Achieved   PT SHORT TERM GOAL #2   Title Report 30% reduction in left knee pain with standing and walking   Time 4   Period Weeks   Status Achieved   PT SHORT TERM GOAL #3   Time 4   Period Weeks   Status Achieved           PT Long Term Goals - 01/14/15 1053    PT LONG TERM GOAL #1   Title Verbalize understanding of RICE method   Time 8   Period Weeks   Status Achieved   PT LONG TERM GOAL #2   Title Independent in advanced HEP   Time 8   Period Weeks   Status On-going   PT LONG TERM GOAL #3   Title Reduce FOTO to< or = to 39% limitation   Time 8   Period Weeks   Status On-going  will do on visit 10   PT LONG  TERM GOAL #4   Title Demonstrate symmetry with ambulation on level surface   Time 8   Period Weeks   Status On-going  Remains with mild limp due to pain.               Plan - 01/14/15 1050    Clinical Impression Statement Held single leg leg press today since pt felt it was too straining and was cause of increased knee pain. Tolerated all other exercises well.   Pt will benefit from skilled therapeutic intervention in order to improve on the following deficits Difficulty walking;Decreased endurance;Pain   Rehab Potential Good   PT Frequency 2x / week   PT Duration 8 weeks   PT Treatment/Interventions Therapeutic activities;Therapeutic exercise;Ultrasound;Gait training;Electrical Stimulation;Functional mobility training;Manual techniques   PT Next Visit Plan Continue with Lt knee strength and proprioception   Consulted and Agree with Plan of Care Patient        Problem List Patient Active Problem List   Diagnosis Date Noted  . Anxiety state 11/27/2014  . Hyperlipidemia 11/26/2014  . GERD (gastroesophageal reflux disease) 11/14/2012  . Type 2 diabetes mellitus without  complications 78/24/2353  . Essential hypertension 12/04/2009    Flara Storti, PTA 01/14/2015, 10:54 AM  [facility] [address] [place], [place], [address] Phone: [phone]   Fax:  [phone]

## 2015-01-16 ENCOUNTER — Encounter: Payer: Self-pay | Admitting: Physical Therapy

## 2015-01-16 ENCOUNTER — Ambulatory Visit: Payer: Medicare Other | Admitting: Physical Therapy

## 2015-01-16 DIAGNOSIS — M6281 Muscle weakness (generalized): Secondary | ICD-10-CM

## 2015-01-16 NOTE — Therapy (Signed)
[facility] [address], [place], [place], [address] Phone: [phone]   Fax:  [phone]  Physical Therapy Treatment  Patient Details  Name: Walter Campbell MRN: [id number] Date of Birth: 1941/09/20 Referring Provider:  Earlie Server, MD  Encounter Date: 01/16/2015      PT End of Session - 01/16/15 1048    Visit Number 9   Number of Visits 10   Date for PT Re-Evaluation 02/01/15   PT Start Time 1010   PT Stop Time 1048   PT Time Calculation (min) 38 min   Activity Tolerance Patient tolerated treatment well   Behavior During Therapy [facility] for tasks assessed/performed      Past Medical History  Diagnosis Date  . Diabetes mellitus   . Hypertension   . Palpitations 06/10/2010    History reviewed. No pertinent past surgical history.  There were no vitals taken for this visit.  Visit Diagnosis:  Generalized muscle weakness      Subjective Assessment - 01/16/15 1013    Symptoms No sharp pains since last visit. Remains with intermittent dull pain.    Currently in Pain? Yes   Pain Score 2    Pain Location Knee   Pain Orientation Left   Pain Descriptors / Indicators Dull   Pain Type Surgical pain   Pain Frequency Intermittent   Multiple Pain Sites No                    [facility] Adult PT Treatment/Exercise - 01/16/15 0001    Knee/Hip Exercises: Aerobic   Stationary Bike Level 3x 10 minutes   Elliptical R1 L1 x3 min   Knee/Hip Exercises: Machines for Strengthening   Cybex Leg Press --  Seat#7 Bil 95 #   Knee/Hip Exercises: Standing   Rocker Board 3 minutes   Walking with Sports Cord 30#-10x each direction   Knee/Hip Exercises: Seated   Long Arc Quad Strengthening;Left;3 sets;10 reps   Long Arc Quad Weight 4 lbs.                  PT Short Term Goals - 01/14/15 1052    PT SHORT TERM GOAL #1   Title Be independent in HEP   Time 4   Period Weeks   Status Achieved   PT SHORT  TERM GOAL #2   Title Report 30% reduction in left knee pain with standing and walking   Time 4   Period Weeks   Status Achieved   PT SHORT TERM GOAL #3   Time 4   Period Weeks   Status Achieved           PT Long Term Goals - 01/14/15 1053    PT LONG TERM GOAL #1   Title Verbalize understanding of RICE method   Time 8   Period Weeks   Status Achieved   PT LONG TERM GOAL #2   Title Independent in advanced HEP   Time 8   Period Weeks   Status On-going   PT LONG TERM GOAL #3   Title Reduce FOTO to< or = to 39% limitation   Time 8   Period Weeks   Status On-going  will do on visit 10   PT LONG TERM GOAL #4   Title Demonstrate symmetry with ambulation on level surface   Time 8   Period Weeks   Status On-going  Remains with mild limp due to pain.  Plan - 01/16/15 1049    Clinical Impression Statement No sharp pains since last week. Increased wt on LAQ without pain. Walking    Pt will benefit from skilled therapeutic intervention in order to improve on the following deficits Difficulty walking;Decreased endurance;Pain   Rehab Potential Good   PT Duration 8 weeks   PT Treatment/Interventions Therapeutic activities;Therapeutic exercise;Ultrasound;Gait training;Electrical Stimulation;Functional mobility training;Manual techniques   PT Next Visit Plan FOTO, continue strength for LT knee and take AROM   Consulted and Agree with Plan of Care Patient        Problem List Patient Active Problem List   Diagnosis Date Noted  . Anxiety state 11/27/2014  . Hyperlipidemia 11/26/2014  . GERD (gastroesophageal reflux disease) 11/14/2012  . Type 2 diabetes mellitus without complications 63/94/3200  . Essential hypertension 12/04/2009    Tally Mattox, PTA 01/16/2015, 10:53 AM  [facility] [address], [place], [place], [address] Phone: [phone]   Fax:  [phone]

## 2015-01-21 ENCOUNTER — Ambulatory Visit: Payer: Medicare Other

## 2015-01-21 DIAGNOSIS — M25562 Pain in left knee: Secondary | ICD-10-CM

## 2015-01-21 DIAGNOSIS — M6281 Muscle weakness (generalized): Secondary | ICD-10-CM | POA: Diagnosis not present

## 2015-01-21 NOTE — Therapy (Signed)
[facility] [address] [place], [place], [address] Phone: [phone]   Fax:  [phone]  Physical Therapy Treatment  Patient Details  Name: Walter Campbell MRN: [id number] Date of Birth: 12/22/1940 Referring Provider:  Earlie Server, MD  Encounter Date: 01/21/2015      PT End of Session - 01/21/15 1028    Visit Number 10   Number of Visits 20  Medicare   Date for PT Re-Evaluation 02/01/15   PT Start Time 1016   PT Stop Time 1049   PT Time Calculation (min) 33 min   Activity Tolerance Patient tolerated treatment well   Behavior During Therapy [facility] for tasks assessed/performed      Past Medical History  Diagnosis Date  . Diabetes mellitus   . Hypertension   . Palpitations 06/10/2010    History reviewed. No pertinent past surgical history.  There were no vitals filed for this visit.  Visit Diagnosis:  Generalized muscle weakness  Knee pain, acute, left      Subjective Assessment - 01/21/15 1024    Symptoms Worked in my yard this weekend.  Pt got a bike this weekend.   Currently in Pain? Yes   Pain Score 2    Pain Location Knee   Pain Orientation Left   Pain Descriptors / Indicators Tightness   Pain Type Surgical pain   Pain Onset 1 to 4 weeks ago   Aggravating Factors  standing for a long period of time   Pain Relieving Factors sitting down to rest, ice   Multiple Pain Sites No            [facility] PT Assessment - 01/21/15 0001    Observation/Other Assessments   Focus on Therapeutic Outcomes (FOTO)  32% limitation                   [facility] Adult PT Treatment/Exercise - 01/21/15 0001    Knee/Hip Exercises: Aerobic   Stationary Bike Level 3x 10 minutes   Knee/Hip Exercises: Machines for Strengthening   Cybex Leg Press 95# bil 3x10  seat #7   Knee/Hip Exercises: Standing   Forward Step Up 3 sets;10 reps;Left  Bosu ball   Walking with Sports Cord 30#-10x each direction    Knee/Hip Exercises: Seated   Long Arc Quad Strengthening;Left;3 sets;10 reps   Long Arc Quad Weight 4 lbs.                  PT Short Term Goals - 01/14/15 1052    PT SHORT TERM GOAL #1   Title Be independent in HEP   Time 4   Period Weeks   Status Achieved   PT SHORT TERM GOAL #2   Title Report 30% reduction in left knee pain with standing and walking   Time 4   Period Weeks   Status Achieved   PT SHORT TERM GOAL #3   Time 4   Period Weeks   Status Achieved           PT Long Term Goals - 01/21/15 1030    PT LONG TERM GOAL #1   Title Verbalize understanding of RICE method   Status Achieved   PT LONG TERM GOAL #2   Title Independent in advanced HEP   Time 8   Period Weeks   Status On-going  Pt is independent in current HEP and reports compliance   PT LONG TERM GOAL #3   Title Reduce FOTO to< or =  to 39% limitation   Time 8   Period Weeks   Status Achieved  32% limitation   PT LONG TERM GOAL #4   Title Demonstrate symmetry with ambulation on level surface   Time 8   Status On-going  very mild limping at times when fatigued   PT LONG TERM GOAL #5   Title Report 75% reduction in LT knee pain with standing and walking   Time 8   Period Weeks   Status On-going  60% reported               Plan - 02/18/2015 1029    Clinical Impression Statement Pt tolerated treatment well.  See goals for assessment. FOTO score is 32% limitation- goal met.   Pt will benefit from skilled therapeutic intervention in order to improve on the following deficits Difficulty walking;Decreased endurance;Pain   Rehab Potential Good   PT Frequency 2x / week   PT Duration 8 weeks   PT Treatment/Interventions Therapeutic activities;Therapeutic exercise;Ultrasound;Gait training;Electrical Stimulation;Functional mobility training;Manual techniques   PT Next Visit Plan Lt knee strength, endurance, proprioception   Consulted and Agree with Plan of Care Patient           G-Codes - 02/18/2015 1021    Functional Assessment Tool Used FOTO: 32% limitation   Functional Limitation Mobility: Walking and moving around   Mobility: Walking and Moving Around Current Status (V4712) At least 20 percent but less than 40 percent impaired, limited or restricted   Mobility: Walking and Moving Around Goal Status [id number]) At least 20 percent but less than 40 percent impaired, limited or restricted      Problem List Patient Active Problem List   Diagnosis Date Noted  . Anxiety state 11/27/2014  . Hyperlipidemia 11/26/2014  . GERD (gastroesophageal reflux disease) 11/14/2012  . Type 2 diabetes mellitus without complications 92/90/9030  . Essential hypertension 12/04/2009    TAKACS,KELLY, PT 2015/02/18, 10:51 AM  [facility] [address] [place], [place], [address] Phone: [phone]   Fax:  [phone]

## 2015-01-23 ENCOUNTER — Encounter: Payer: 59 | Admitting: Physical Therapy

## 2015-01-28 ENCOUNTER — Encounter: Payer: 59 | Admitting: Physical Therapy

## 2015-01-30 ENCOUNTER — Encounter: Payer: Self-pay | Admitting: Physical Therapy

## 2015-01-30 ENCOUNTER — Ambulatory Visit: Payer: Medicare Other | Admitting: Physical Therapy

## 2015-01-30 DIAGNOSIS — M6281 Muscle weakness (generalized): Secondary | ICD-10-CM

## 2015-01-30 NOTE — Therapy (Signed)
[facility] [address] [place], [place], [address] Phone: [phone]   Fax:  [phone]  Physical Therapy Treatment  Patient Details  Name: Walter Campbell MRN: [id number] Date of Birth: 27-Mar-1941 Referring Provider:  Earlie Server, MD  Encounter Date: 01/30/2015      PT End of Session - 01/30/15 1048    Visit Number 11   Number of Visits 20   Date for PT Re-Evaluation 03/01/15   PT Start Time 4734   PT Stop Time 1051   PT Time Calculation (min) 36 min      Past Medical History  Diagnosis Date  . Diabetes mellitus   . Hypertension   . Palpitations 06/10/2010    History reviewed. No pertinent past surgical history.  There were no vitals filed for this visit.  Visit Diagnosis:  Generalized muscle weakness - Plan: PT plan of care cert/re-cert      Subjective Assessment - 01/30/15 1018    Symptoms Just returned from trip to [place]. Knee and hip were "creaky."   Currently in Pain? Yes   Pain Location Knee   Pain Orientation Left   Pain Descriptors / Indicators Aching   Pain Type Surgical pain   Aggravating Factors  Standing too long   Pain Relieving Factors Sitting down, rest, ice   Multiple Pain Sites No            [facility] PT Assessment - 01/30/15 0001    AROM   Overall AROM  --  0-135 AROM in supine   Strength   Overall Strength --  4+/5 quads. 5/5 Hams                   [facility] Adult PT Treatment/Exercise - 01/30/15 0001    Knee/Hip Exercises: Aerobic   Stationary Bike L3 x10   Knee/Hip Exercises: Machines for Strengthening   Cybex Leg Press 95# bil 3x10  seat #7   Knee/Hip Exercises: Standing   Rocker Board 3 minutes   Walking with Sports Cord 30#-10x each direction   Knee/Hip Exercises: Seated   Long Arc Quad Strengthening;Left;3 sets;10 reps   Long Arc Quad Weight 4 lbs.                  PT Short Term Goals - 01/30/15 1023    PT SHORT TERM GOAL #1   Title Be independent in HEP   Time 4   Period Weeks   Status Achieved   PT SHORT TERM GOAL #2   Title Report 30% reduction in left knee pain with standing and walking   Time 4   Period Weeks   Status Achieved   PT SHORT TERM GOAL #3   Title Ambulate in community without limitation   Time 4   Period Weeks   Status Achieved           PT Long Term Goals - 01/30/15 1024    PT LONG TERM GOAL #1   Title Verbalize understanding of RICE method   Time 8   Period Weeks   Status Achieved   PT LONG TERM GOAL #2   Title Independent in advanced HEP   Time 8   Period Weeks   Status On-going   PT LONG TERM GOAL #3   Title Reduce FOTO to< or = to 39% limitation   Time 8   Period Weeks   Status Achieved   PT LONG TERM GOAL #4   Title Demonstrate symmetry  with ambulation on level surface   Time 8   Period Weeks   Status On-going  Patient still walks with a limp fairly constantly.    PT LONG TERM GOAL #5   Title Report 75% reduction in LT knee pain with standing and walking   Time 8   Period Weeks   Status On-going  60% still.   PT LONG TERM GOAL #6   Title Retrun to playing golf without limitation   Period Weeks   Status On-going  HAs not attempted yet. Plans to try next week.                Plan - 01/30/15 1050    Clinical Impression Statement Patient able to go through exercises with some increased soreness and limp.   Pt will benefit from skilled therapeutic intervention in order to improve on the following deficits Difficulty walking;Decreased endurance;Pain   Rehab Potential Good   PT Frequency 2x / week   PT Duration 4 weeks   PT Treatment/Interventions Therapeutic activities;Therapeutic exercise;Ultrasound;Gait training;Electrical Stimulation;Functional mobility training;Manual techniques   PT Next Visit Plan Lt knee strength, endurance, proprioception   Consulted and Agree with Plan of Care Patient        Problem List Patient Active Problem List    Diagnosis Date Noted  . Anxiety state 11/27/2014  . Hyperlipidemia 11/26/2014  . GERD (gastroesophageal reflux disease) 11/14/2012  . Type 2 diabetes mellitus without complications 50/53/9767  . Essential hypertension 12/04/2009    TAKACS,KELLY, PT 01/30/2015, 11:04 AM  [facility] [address] [place], [place], [address] Phone: [phone]   Fax:  [phone]

## 2015-02-04 ENCOUNTER — Encounter: Payer: Self-pay | Admitting: Physical Therapy

## 2015-02-04 ENCOUNTER — Ambulatory Visit: Payer: Medicare Other | Admitting: Physical Therapy

## 2015-02-04 DIAGNOSIS — M6281 Muscle weakness (generalized): Secondary | ICD-10-CM | POA: Diagnosis not present

## 2015-02-04 DIAGNOSIS — M25562 Pain in left knee: Secondary | ICD-10-CM

## 2015-02-04 NOTE — Therapy (Signed)
[facility] [address], [place], [place], [address] Phone: [phone]   Fax:  [phone]  Physical Therapy Treatment  Patient Details  Name: Walter Campbell MRN: [id number] Date of Birth: 1941/03/12 Referring Provider:  Marin Olp, MD  Encounter Date: 02/04/2015      PT End of Session - 02/04/15 1052    Visit Number 12   Number of Visits 20   Date for PT Re-Evaluation 03/01/15   PT Start Time 7824   PT Stop Time 1055   PT Time Calculation (min) 40 min   Activity Tolerance Patient tolerated treatment well   Behavior During Therapy [facility] for tasks assessed/performed      Past Medical History  Diagnosis Date  . Diabetes mellitus   . Hypertension   . Palpitations 06/10/2010    History reviewed. No pertinent past surgical history.  There were no vitals filed for this visit.  Visit Diagnosis:  Generalized muscle weakness  Knee pain, acute, left      Subjective Assessment - 02/04/15 1019    Symptoms Hit golf balls the othe day. Swinging was fine, but putting ball on tee position really hurt the knee. Planted bulbs on Friday was sore.   Currently in Pain? Yes   Pain Score 2    Pain Location Knee   Pain Orientation Left   Pain Descriptors / Indicators Tightness   Pain Type Surgical pain   Pain Onset 1 to 4 weeks ago   Pain Frequency Intermittent   Aggravating Factors  A Walter of standing activity   Pain Relieving Factors Ice, rest,    Multiple Pain Sites No                       [facility] Adult PT Treatment/Exercise - 02/04/15 0001    Knee/Hip Exercises: Aerobic   Stationary Bike L3 x 10 min   Knee/Hip Exercises: Machines for Strengthening   Cybex Leg Press 95# bil 3 x15   Knee/Hip Exercises: Standing   Rocker Board 3 minutes   Rebounder weight shift 3 ways 1 min   Walking with Sports Cord 30#-10x each direction   Knee/Hip Exercises: Seated   Long Arc Quad Strengthening;Left;3  sets;10 reps   Long Arc Quad Weight 5 lbs.                  PT Short Term Goals - 02/04/15 1048    PT SHORT TERM GOAL #1   Title Be independent in HEP   Time 4   Period Weeks   Status Achieved   PT SHORT TERM GOAL #2   Title Report 30% reduction in left knee pain with standing and walking   Time 4   Period Weeks   Status Achieved   PT SHORT TERM GOAL #3   Title Ambulate in community without limitation   Time 4   Period Weeks   Status Achieved           PT Long Term Goals - 02/04/15 1049    PT LONG TERM GOAL #1   Title Verbalize understanding of RICE method   Time 8   Period Weeks   Status Achieved   PT LONG TERM GOAL #2   Title Independent in advanced HEP   Time 8   Period Weeks   Status On-going   PT LONG TERM GOAL #3   Title Reduce FOTO to< or = to 39% limitation   Time 8  Period Weeks   Status Achieved   PT LONG TERM GOAL #4   Title Demonstrate symmetry with ambulation on level surface   Time 8   Period Weeks   Status On-going   PT LONG TERM GOAL #5   Title Report 75% reduction in LT knee pain with standing and walking   Time 8   Period Weeks   Status On-going   PT LONG TERM GOAL #6   Title Retrun to playing golf without limitation   Time 0   Period Weeks   Status On-going  Swinging club was ok, but putting ball on tee was painful.               Plan - 02/04/15 1053    Clinical Impression Statement Patient reports he plans to start riding bike at home and may want to stop at end of this week. He can hit golf balls fine, just puting ball on tee was painful.   Pt will benefit from skilled therapeutic intervention in order to improve on the following deficits Difficulty walking;Decreased endurance;Pain   Rehab Potential Good   PT Frequency 2x / week   PT Duration 4 weeks   PT Treatment/Interventions Therapeutic activities;Therapeutic exercise;Ultrasound;Gait training;Electrical Stimulation;Functional mobility training;Manual  techniques   PT Next Visit Plan Lt knee strength, endurance, proprioception   Consulted and Agree with Plan of Care Patient        Problem List Patient Active Problem List   Diagnosis Date Noted  . Anxiety state 11/27/2014  . Hyperlipidemia 11/26/2014  . GERD (gastroesophageal reflux disease) 11/14/2012  . Type 2 diabetes mellitus without complications 72/53/6644  . Essential hypertension 12/04/2009    Khilee Hendricksen, PTA 02/04/2015, 10:55 AM  [facility] [address] [place], [place], [address] Phone: [phone]   Fax:  [phone]

## 2015-02-06 ENCOUNTER — Ambulatory Visit: Payer: Medicare Other | Admitting: Physical Therapy

## 2015-02-06 ENCOUNTER — Encounter: Payer: Self-pay | Admitting: Physical Therapy

## 2015-02-06 DIAGNOSIS — M6281 Muscle weakness (generalized): Secondary | ICD-10-CM | POA: Diagnosis not present

## 2015-02-06 DIAGNOSIS — M25562 Pain in left knee: Secondary | ICD-10-CM

## 2015-02-06 NOTE — Therapy (Addendum)
[facility] [address] [place], [place], [address] Phone: [phone]   Fax:  [phone]  Physical Therapy Treatment  Patient Details  Name: Walter Campbell MRN: [id number] Date of Birth: February 24, 1941 Referring Provider:  Earlie Server, MD  Encounter Date: 02/06/2015      PT End of Session - 02/06/15 1036    Visit Number 13   Number of Visits 20   Date for PT Re-Evaluation 03/01/15   PT Start Time 6270   PT Stop Time 1050   PT Time Calculation (min) 35 min   Activity Tolerance Patient tolerated treatment well   Behavior During Therapy [facility] for tasks assessed/performed      Past Medical History  Diagnosis Date  . Diabetes mellitus   . Hypertension   . Palpitations 06/10/2010    History reviewed. No pertinent past surgical history.  There were no vitals filed for this visit.  Visit Diagnosis:  Generalized muscle weakness  Knee pain, acute, left      Subjective Assessment - 02/06/15 1016    Symptoms Would like to be on hold x 2 weeks. Feels like he is doing well enough to try on his own.    Currently in Pain? Yes   Pain Score 2    Pain Location Knee   Pain Orientation Left   Pain Descriptors / Indicators Tightness   Pain Type Surgical pain   Aggravating Factors  A lot of activity   Pain Relieving Factors ice, rest   Multiple Pain Sites No            [facility] PT Assessment - 02/06/15 0001    Observation/Other Assessments   Focus on Therapeutic Outcomes (FOTO)  30%, CJ                   [facility] Adult PT Treatment/Exercise - 02/06/15 0001    Knee/Hip Exercises: Aerobic   Stationary Bike L3 x 10    Knee/Hip Exercises: Machines for Strengthening   Cybex Leg Press 95# bil 3 x15   Knee/Hip Exercises: Standing   Rocker Board 3 minutes   Rebounder weight shift 3 ways 1 min   Walking with Sports Cord 30#-10x each direction   Knee/Hip Exercises: Seated   Long Arc Quad  Strengthening;Left;3 sets;10 reps   Long Arc Quad Weight 5 lbs.                  PT Short Term Goals - 02/06/15 1044    PT SHORT TERM GOAL #1   Title Be independent in HEP   Time 4   Period Weeks   Status Achieved   PT SHORT TERM GOAL #2   Title Report 30% reduction in left knee pain with standing and walking   Time 4   Period Weeks   Status Achieved   PT SHORT TERM GOAL #3   Title Ambulate in community without limitation   Time 4   Period Weeks   Status Achieved           PT Long Term Goals - 02/06/15 1045    PT LONG TERM GOAL #1   Title Verbalize understanding of RICE method   Time 8   Period Weeks   Status Achieved   PT LONG TERM GOAL #2   Title Independent in advanced HEP   Time 8   Period Weeks   Status --  Has returned to previous PIlates exercises.   PT LONG TERM GOAL #3  Title Reduce FOTO to< or = to 39% limitation   Time 8   Period Weeks   Status Achieved   PT LONG TERM GOAL #4   Title Demonstrate symmetry with ambulation on level surface   Time 8   Period Weeks   Status On-going  Remains with slight limp especially when getting up from sitting.               Plan - 02/06/15 1040    Clinical Impression Statement Patient continues to do well with exercises and has met most of his goals.   Pt will benefit from skilled therapeutic intervention in order to improve on the following deficits Difficulty walking;Decreased endurance;Pain   Rehab Potential Good   PT Frequency 2x / week   PT Duration 4 weeks   PT Treatment/Interventions Therapeutic activities;Therapeutic exercise;Ultrasound;Gait training;Electrical Stimulation;Functional mobility training;Manual techniques   PT Next Visit Plan Patient on hold x 2 weeks   Consulted and Agree with Plan of Care Patient        Problem List Patient Active Problem List   Diagnosis Date Noted  . Anxiety state 11/27/2014  . Hyperlipidemia 11/26/2014  . GERD (gastroesophageal reflux  disease) 11/14/2012  . Type 2 diabetes mellitus without complications 24/46/2863  . Essential hypertension 12/04/2009    Mirren Gest, PTA 02/06/2015, 11:03 AM PHYSICAL THERAPY DISCHARGE SUMMARY  Visits from Start of Care: 13  Current functional level related to goals / functional outcomes: See above for goal status.     Remaining deficits: No significant limitations remain.   Education / Equipment: HEP, RICE Plan: Patient agrees to discharge.  Patient goals were partially met. Patient is being discharged due to being pleased with the current functional level.  ?????   Sigurd Sos, PT 03/14/2015 10:05 AM  [facility] [address] [place], [place], [address] Phone: [phone]   Fax:  [phone]

## 2015-03-07 ENCOUNTER — Other Ambulatory Visit: Payer: Self-pay | Admitting: Internal Medicine

## 2015-03-11 ENCOUNTER — Other Ambulatory Visit: Payer: Self-pay | Admitting: Internal Medicine

## 2015-05-02 ENCOUNTER — Encounter: Payer: Self-pay | Admitting: Family Medicine

## 2015-05-02 ENCOUNTER — Ambulatory Visit (INDEPENDENT_AMBULATORY_CARE_PROVIDER_SITE_OTHER): Payer: Medicare Other | Admitting: Family Medicine

## 2015-05-02 VITALS — BP 120/72 | HR 85 | Temp 99.3°F | Wt 217.0 lb

## 2015-05-02 DIAGNOSIS — J329 Chronic sinusitis, unspecified: Secondary | ICD-10-CM

## 2015-05-02 DIAGNOSIS — B9689 Other specified bacterial agents as the cause of diseases classified elsewhere: Secondary | ICD-10-CM

## 2015-05-02 DIAGNOSIS — A499 Bacterial infection, unspecified: Secondary | ICD-10-CM

## 2015-05-02 MED ORDER — AMOXICILLIN-POT CLAVULANATE 875-125 MG PO TABS: 1.0000 | ORAL_TABLET | Freq: Two times a day (BID) | ORAL | Status: DC

## 2015-05-02 NOTE — Progress Notes (Signed)
  PCP: Garret Reddish, MD  Subjective:  Walter Campbell is a 74 y.o. year old very pleasant male patient who presents with sinusitis symptoms  3 weeks ago cleaning out garage. Thought he was having allergic symptoms. Now with continued symptoms. Feels heaviness/pressure throughout his sinuses. Hacking cough. If takes a claritin he can at least sleep through the night, otherwise wakes up with productive yellow or green mucus. Some mild fatigue but still operating largely normally. Some hoarseness. Mild sore throat at times. Tried mucinex for 3-4 days and helped some. Thought things were getting better until this Monday and then things turned for the worse.   ROS-denies fever, SOB, NVD, tooth pain  Pertinent Past Medical History- Type 2 DM, HTN, HLD, GERD Lab Results  Component Value Date   HGBA1C 7.4* 10/10/2014   Medications- reviewed  Current Outpatient Prescriptions  Medication Sig Dispense Refill  . aspirin 81 MG tablet Take 81 mg by mouth daily.      Marland Kitchen atorvastatin (LIPITOR) 20 MG tablet Twice a week (Mon, fridays) 30 tablet 3  . benazepril-hydrochlorthiazide (LOTENSIN HCT) 20-25 MG per tablet TAKE 1 TABLET EVERY DAY 90 tablet 3  . felodipine (PLENDIL) 5 MG 24 hr tablet TAKE 1 TABLET (5 MG TOTAL) BY MOUTH DAILY. 90 tablet 2  . metFORMIN (GLUCOPHAGE-XR) 500 MG 24 hr tablet Take 2 tablets (1,000 mg total) by mouth 2 (two) times daily with a meal. 360 tablet 1  . ONE TOUCH ULTRA TEST test strip USE AS INSTRUCTED 100 each 0  . pantoprazole (PROTONIX) 40 MG tablet Take 40 mg by mouth daily.     No current facility-administered medications for this visit.    Objective: BP 120/72 mmHg  Pulse 85  Temp(Src) 99.3 F (37.4 C)  Wt 217 lb (98.431 kg)  SpO2 95% Gen: NAD, resting comfortably HEENT: Turbinates erythematous, TM normal, pharynx mildly erythematous with no tonsilar exudate or edema, moderate maxillary sinus tenderness CV: RRR no murmurs rubs or gallops Lungs: CTAB no  crackles, wheeze, rhonchi Abdomen: soft/nontender/nondistended/normal bowel sounds. No rebound or guarding.  Ext: no edema Skin: warm, dry, no rash Neuro: grossly normal, moves all extremities  Assessment/Plan:  Bacterial Sinusitis Based on double sickening and >10 days of symptoms (3 weeks) Augmentin 8 days Finally, we reviewed reasons to return to care including if symptoms worsen or persist or new concerns arise.  Meds ordered this encounter  Medications  . amoxicillin-clavulanate (AUGMENTIN) 875-125 MG per tablet    Sig: Take 1 tablet by mouth 2 (two) times daily.    Dispense:  16 tablet    Refill:  0

## 2015-05-02 NOTE — Patient Instructions (Signed)
Return to care if symptoms do not resolve with completion of antibiotics or if new or worsening symptoms  This sounds and looks like bacterial sinusitis thus you require antibiotics

## 2015-05-29 ENCOUNTER — Encounter: Payer: Self-pay | Admitting: Family Medicine

## 2015-05-29 ENCOUNTER — Ambulatory Visit (INDEPENDENT_AMBULATORY_CARE_PROVIDER_SITE_OTHER): Payer: Medicare Other | Admitting: Family Medicine

## 2015-05-29 VITALS — BP 130/72 | HR 71 | Temp 98.4°F | Ht 69.0 in | Wt 219.0 lb

## 2015-05-29 DIAGNOSIS — E785 Hyperlipidemia, unspecified: Secondary | ICD-10-CM | POA: Diagnosis not present

## 2015-05-29 DIAGNOSIS — E119 Type 2 diabetes mellitus without complications: Secondary | ICD-10-CM

## 2015-05-29 DIAGNOSIS — Z0000 Encounter for general adult medical examination without abnormal findings: Secondary | ICD-10-CM | POA: Diagnosis not present

## 2015-05-29 DIAGNOSIS — I10 Essential (primary) hypertension: Secondary | ICD-10-CM | POA: Diagnosis not present

## 2015-05-29 LAB — CBC
HEMATOCRIT: 45 % (ref 39.0–52.0)
Hemoglobin: 14.9 g/dL (ref 13.0–17.0)
MCHC: 33 g/dL (ref 30.0–36.0)
MCV: 87.8 fl (ref 78.0–100.0)
PLATELETS: 256 10*3/uL (ref 150.0–400.0)
RBC: 5.12 Mil/uL (ref 4.22–5.81)
RDW: 14.1 % (ref 11.5–15.5)
WBC: 7.9 10*3/uL (ref 4.0–10.5)

## 2015-05-29 LAB — LIPID PANEL
Cholesterol: 145 mg/dL (ref 0–200)
HDL: 54.9 mg/dL (ref >39.00)
LDL Cholesterol: 72 mg/dL (ref 0–99)
NONHDL: 90.1
Total CHOL/HDL Ratio: 3
Triglycerides: 93 mg/dL (ref 0.0–149.0)
VLDL: 18.6 mg/dL (ref 0.0–40.0)

## 2015-05-29 LAB — COMPREHENSIVE METABOLIC PANEL
ALT: 23 U/L (ref 0–53)
AST: 18 U/L (ref 0–37)
Albumin: 4.1 g/dL (ref 3.5–5.2)
Alkaline Phosphatase: 60 U/L (ref 39–117)
BUN: 18 mg/dL (ref 6–23)
CO2: 30 mEq/L (ref 19–32)
Calcium: 9.6 mg/dL (ref 8.4–10.5)
Chloride: 102 mEq/L (ref 96–112)
Creatinine, Ser: 0.89 mg/dL (ref 0.40–1.50)
GFR: 89.13 mL/min (ref >60.00)
Glucose, Bld: 140 mg/dL — ABNORMAL HIGH (ref 70–99)
Potassium: 4.4 mEq/L (ref 3.5–5.1)
Sodium: 140 mEq/L (ref 135–145)
TOTAL PROTEIN: 6.6 g/dL (ref 6.0–8.3)
Total Bilirubin: 0.5 mg/dL (ref 0.2–1.2)

## 2015-05-29 LAB — TSH: TSH: 2.3 u[IU]/mL (ref 0.35–4.50)

## 2015-05-29 LAB — HEMOGLOBIN A1C: Hgb A1c MFr Bld: 7.2 % — ABNORMAL HIGH (ref 4.6–6.5)

## 2015-05-29 NOTE — Progress Notes (Signed)
Walter Reddish, MD Phone: [phone]  Subjective:  Patient presents today for their annual physical. Chief complaint-noted.   HLD- life screening showed mild carotid stenosis within a year From last note "Myoview stress test 2012 was normal but dad with history MI and patient with HTn, HLD mild, DM. I advised him a statin would be indicated but he is concerned of risk. We opted for atorvastatin 20mg  once weekly to start and up to twice a week if no side effects or elevation in CBGs" Patient has been compliant with twice a week atorvastatin.   HTN- controlled on felodipine and benazepril-hctz  DM suspect controlled or mild poor control on metformin 500mg  XR 3x a day. Has not seen endocrine in some time. No hypoglycemia  ROS- full  review of systems was completed and negative   The following were reviewed and entered/updated in epic: Past Medical History  Diagnosis Date  . Diabetes mellitus   . Hypertension   . Palpitations 06/10/2010   Patient Active Problem List   Diagnosis Date Noted  . Type 2 diabetes mellitus without complications 16/60/6301    Priority: High  . Hyperlipidemia 11/26/2014    Priority: Medium  . Essential hypertension 12/04/2009    Priority: Medium  . Anxiety state 11/27/2014    Priority: Low  . GERD (gastroesophageal reflux disease) 11/14/2012    Priority: Low   Past Surgical History  Procedure Laterality Date  . Knee surgery  1990s, 2015, 2016    Torn meniscus L scope 2016, R 2015, L 1991 scope  . Appendectomy  1957  . 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 Stroke Mother   . Diabetes Mother   . Osteoporosis Mother   . Dementia Mother   . Heart disease Mother     pacemaker  . Hyperlipidemia Father     diet controlled  . Arthritis Sister   . Hyperthyroidism Sister   . CAD Father     stents in 24s or 1s    Medications- reviewed and updated Current Outpatient  Prescriptions  Medication Sig Dispense Refill  . aspirin 81 MG tablet Take 81 mg by mouth daily.      Marland Kitchen atorvastatin (LIPITOR) 20 MG tablet Twice a week (Mon, fridays) 30 tablet 3  . benazepril-hydrochlorthiazide (LOTENSIN HCT) 20-25 MG per tablet TAKE 1 TABLET EVERY DAY 90 tablet 3  . felodipine (PLENDIL) 5 MG 24 hr tablet TAKE 1 TABLET (5 MG TOTAL) BY MOUTH DAILY. 90 tablet 2  . metFORMIN (GLUCOPHAGE-XR) 500 MG 24 hr tablet Take 2 tablets (1,000 mg total) by mouth 2 (two) times daily with a meal. 360 tablet 1  . ONE TOUCH ULTRA TEST test strip USE AS INSTRUCTED 100 each 0   Allergies-reviewed and updated No Known Allergies  History   Social History  . Marital Status: Single    Spouse Name: N/A  . Number of Children: N/A  . Years of Education: N/A   Social History Main Topics  . Smoking status: Never Smoker   . Smokeless tobacco: Never Used  . Alcohol Use: 0.6 - 4.2 oz/week    1-7 Standard drinks or equivalent per week  . Drug Use: No  . Sexual Activity: Not on file   Other Topics Concern  . Not on file   Social History Narrative   Married. 1 daughter, 2 step sons. 8 grandkids. No greatgrandkids.  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 Oropharynx normal Neck: no thyromegaly CV: RRR no murmurs rubs or gallops Lungs: CTAB no crackles, wheeze, rhonchi Abdomen: soft/nontender/nondistended/normal bowel sounds. No rebound or guarding.  Rectal jointly declined Ext: no edema Skin: warm, dry, no rash Neuro: grossly normal, moves all extremities, PERRLA   Assessment/Plan:  74 y.o. male presenting for annual physical.  Health Maintenance counseling: 1. Anticipatory guidance: Patient counseled  regarding regular dental exams, wearing seatbelts, wearing sunscreen. Sees dermatology yearly.  2. Risk factor reduction:  Advised patient of need for regular exercise (pilates twice a week currently, stationary bike at least 10 minutes a day) and diet rich and fruits and vegetables to reduce risk of heart attack and stroke. Weight goal 200.  3. Immunizations/screenings/ancillary studies Health Maintenance Due  Topic Date Due  . HEMOGLOBIN A1C - today 04/11/2015  4. Colon cancer screening-tubular adenoma 09/11/13 with [facility]- assume 5 year repeat. Patient updates me 3 years.  5. Prostate cancer screening-  Opts out of this with shared decision making with me. Declines rectal. Stable Nocturia 2x a night.    6 months f/u advised and sooner f/u with Dr. Cruzita Lederer  Fasting today Results for orders placed or performed in visit on 05/29/15 (from the past 24 hour(s))  Hemoglobin A1c     Status: Abnormal   Collection Time: 05/29/15  8:49 AM  Result Value Ref Range   Hgb A1c MFr Bld 7.2 (H) 4.6 - 6.5 %  CBC     Status: None   Collection Time: 05/29/15  8:49 AM  Result Value Ref Range   WBC 7.9 4.0 - 10.5 K/uL   RBC 5.12 4.22 - 5.81 Mil/uL   Platelets 256.0 150.0 - 400.0 K/uL   Hemoglobin 14.9 13.0 - 17.0 g/dL   HCT 45.0 39.0 - 52.0 %   MCV 87.8 78.0 - 100.0 fl   MCHC 33.0 30.0 - 36.0 g/dL   RDW 14.1 11.5 - 15.5 %  Comprehensive metabolic panel     Status: Abnormal   Collection Time: 05/29/15  8:49 AM  Result Value Ref Range   Sodium 140 135 - 145 mEq/L   Potassium 4.4 3.5 - 5.1 mEq/L   Chloride 102 96 - 112 mEq/L   CO2 30 19 - 32 mEq/L   Glucose, Bld 140 (H) 70 - 99 mg/dL   BUN 18 6 - 23 mg/dL   Creatinine, Ser 0.89 0.40 - 1.50 mg/dL   Total Bilirubin 0.5 0.2 - 1.2 mg/dL   Alkaline Phosphatase 60 39 - 117 U/L   AST 18 0 - 37 U/L   ALT 23 0 - 53 U/L   Total Protein 6.6 6.0 - 8.3 g/dL   Albumin 4.1 3.5 - 5.2 g/dL   Calcium 9.6 8.4 - 10.5 mg/dL   GFR 89.13 >60.00 mL/min  Lipid panel      Status: None   Collection Time: 05/29/15  8:49 AM  Result Value Ref Range   Cholesterol 145 0 - 200 mg/dL   Triglycerides 93.0 0.0 - 149.0 mg/dL   HDL 54.90 >39.00 mg/dL   VLDL 18.6 0.0 - 40.0 mg/dL   LDL Cholesterol 72 0 - 99 mg/dL  Total CHOL/HDL Ratio 3    NonHDL 90.10   TSH     Status: None   Collection Time: 05/29/15  8:49 AM  Result Value Ref Range   TSH 2.30 0.35 - 4.50 uIU/mL  a1c slightly above goal- advised weight goal 200, consider increse metformin from 1.5g per day to 2g per day through me or Dr. Cruzita Lederer Lipids look great

## 2015-05-29 NOTE — Patient Instructions (Addendum)
Overall things look great. Blood pressure in good shape. Hopeful a1c around 7. If above it, let's consider increasing metformin through [place] or getting you back with Dr. Cruzita Lederer.   We set a weight goal of 200 lbs. Focus on water only, up the exercise even to 15-20 minutes on the bike daily and that may help.   Fasting labs before you leave

## 2015-07-27 ENCOUNTER — Other Ambulatory Visit: Payer: Self-pay | Admitting: Internal Medicine

## 2015-09-06 ENCOUNTER — Ambulatory Visit (INDEPENDENT_AMBULATORY_CARE_PROVIDER_SITE_OTHER): Payer: Medicare Other | Admitting: Family Medicine

## 2015-09-06 DIAGNOSIS — Z23 Encounter for immunization: Secondary | ICD-10-CM | POA: Diagnosis not present

## 2015-10-24 ENCOUNTER — Other Ambulatory Visit: Payer: Self-pay | Admitting: Internal Medicine

## 2015-10-26 ENCOUNTER — Other Ambulatory Visit: Payer: Self-pay | Admitting: Internal Medicine

## 2015-11-13 LAB — HM DIABETES EYE EXAM

## 2015-11-19 ENCOUNTER — Encounter: Payer: Self-pay | Admitting: Family Medicine

## 2015-12-01 ENCOUNTER — Other Ambulatory Visit: Payer: Self-pay | Admitting: Family Medicine

## 2015-12-04 ENCOUNTER — Other Ambulatory Visit: Payer: Self-pay | Admitting: Internal Medicine

## 2016-01-01 ENCOUNTER — Telehealth: Payer: Self-pay | Admitting: Family Medicine

## 2016-01-01 NOTE — Telephone Encounter (Signed)
Pt request refill  metFORMIN (GLUCOPHAGE-XR) 500 MG 24 hr tablet   360 tab  90 day  [facility] [address]  Pt is leaving town Friday at noon and hopes he can get before then. Thank you.

## 2016-01-02 ENCOUNTER — Other Ambulatory Visit: Payer: Self-pay

## 2016-01-02 MED ORDER — METFORMIN HCL ER 500 MG PO TB24: ORAL_TABLET | ORAL | Status: DC

## 2016-01-02 NOTE — Telephone Encounter (Signed)
Rx refilled.

## 2016-02-28 ENCOUNTER — Other Ambulatory Visit: Payer: Self-pay | Admitting: Family Medicine

## 2016-03-10 ENCOUNTER — Telehealth: Payer: Self-pay | Admitting: Family Medicine

## 2016-03-10 NOTE — Telephone Encounter (Signed)
Pt is scheduled for labs on 06/05/16 and would like to have a PSA done. Need an order placed please .

## 2016-03-10 NOTE — Telephone Encounter (Signed)
Current guidelines do not recommend testing in his age group  If he would still like PSA, may order under Z12.5 (Encounter for screening of prostate cancer). If this doesn't "take" will have to wait until visit for me to complete

## 2016-03-10 NOTE — Telephone Encounter (Signed)
Ok to order PSA?

## 2016-03-11 ENCOUNTER — Other Ambulatory Visit: Payer: Self-pay | Admitting: Family Medicine

## 2016-03-11 DIAGNOSIS — Z125 Encounter for screening for malignant neoplasm of prostate: Secondary | ICD-10-CM

## 2016-03-11 NOTE — Telephone Encounter (Signed)
He would like the PSA  Test

## 2016-03-11 NOTE — Telephone Encounter (Signed)
PSA order placed

## 2016-04-27 ENCOUNTER — Encounter: Payer: Self-pay | Admitting: Family Medicine

## 2016-06-01 ENCOUNTER — Encounter: Payer: Self-pay | Admitting: Family Medicine

## 2016-06-05 ENCOUNTER — Other Ambulatory Visit: Payer: Medicare Other

## 2016-06-11 ENCOUNTER — Encounter: Payer: Medicare Other | Admitting: Family Medicine

## 2016-07-02 ENCOUNTER — Telehealth: Payer: Self-pay | Admitting: Family Medicine

## 2016-07-02 NOTE — Telephone Encounter (Signed)
[place] the beach and left his meds at home. Would like to know if dr hunter will call in  12 tabs   //   metFORMIN (GLUCOPHAGE-XR) 500 MG 24 hr tablet  [facility] [address]   [phone]

## 2016-07-02 NOTE — Telephone Encounter (Signed)
Perfect thanks

## 2016-07-02 NOTE — Telephone Encounter (Signed)
Phoned in #12 of patients Metformin to pharmacy in [place]. Left home without them. Patient has appointment for annual exam with Dr. Yong Channel 07-10-16

## 2016-07-10 ENCOUNTER — Ambulatory Visit (INDEPENDENT_AMBULATORY_CARE_PROVIDER_SITE_OTHER): Payer: Medicare Other | Admitting: Family Medicine

## 2016-07-10 ENCOUNTER — Other Ambulatory Visit: Payer: Self-pay

## 2016-07-10 ENCOUNTER — Encounter: Payer: Self-pay | Admitting: Family Medicine

## 2016-07-10 VITALS — BP 130/82 | HR 67 | Temp 98.1°F | Ht 69.0 in | Wt 210.8 lb

## 2016-07-10 DIAGNOSIS — R351 Nocturia: Secondary | ICD-10-CM

## 2016-07-10 DIAGNOSIS — E785 Hyperlipidemia, unspecified: Secondary | ICD-10-CM

## 2016-07-10 DIAGNOSIS — E119 Type 2 diabetes mellitus without complications: Secondary | ICD-10-CM

## 2016-07-10 DIAGNOSIS — Z23 Encounter for immunization: Secondary | ICD-10-CM

## 2016-07-10 DIAGNOSIS — I10 Essential (primary) hypertension: Secondary | ICD-10-CM

## 2016-07-10 DIAGNOSIS — N401 Enlarged prostate with lower urinary tract symptoms: Secondary | ICD-10-CM | POA: Diagnosis not present

## 2016-07-10 DIAGNOSIS — Z0000 Encounter for general adult medical examination without abnormal findings: Secondary | ICD-10-CM

## 2016-07-10 DIAGNOSIS — K219 Gastro-esophageal reflux disease without esophagitis: Secondary | ICD-10-CM

## 2016-07-10 LAB — LIPID PANEL
CHOL/HDL RATIO: 4
Cholesterol: 181 mg/dL (ref 0–200)
HDL: 50.7 mg/dL (ref >39.00)
LDL Cholesterol: 101 mg/dL — ABNORMAL HIGH (ref 0–99)
NONHDL: 130.25
Triglycerides: 146 mg/dL (ref 0.0–149.0)
VLDL: 29.2 mg/dL (ref 0.0–40.0)

## 2016-07-10 LAB — COMPREHENSIVE METABOLIC PANEL
ALT: 28 U/L (ref 0–53)
AST: 21 U/L (ref 0–37)
Albumin: 4.2 g/dL (ref 3.5–5.2)
Alkaline Phosphatase: 54 U/L (ref 39–117)
BILIRUBIN TOTAL: 0.6 mg/dL (ref 0.2–1.2)
BUN: 17 mg/dL (ref 6–23)
CO2: 30 meq/L (ref 19–32)
CREATININE: 0.9 mg/dL (ref 0.40–1.50)
Calcium: 9.1 mg/dL (ref 8.4–10.5)
Chloride: 103 mEq/L (ref 96–112)
GFR: 87.72 mL/min (ref >60.00)
GLUCOSE: 138 mg/dL — AB (ref 70–99)
Potassium: 3.9 mEq/L (ref 3.5–5.1)
SODIUM: 140 meq/L (ref 135–145)
Total Protein: 6.4 g/dL (ref 6.0–8.3)

## 2016-07-10 LAB — CBC
HEMATOCRIT: 41.1 % (ref 39.0–52.0)
Hemoglobin: 14 g/dL (ref 13.0–17.0)
MCHC: 34 g/dL (ref 30.0–36.0)
MCV: 86.8 fl (ref 78.0–100.0)
PLATELETS: 259 10*3/uL (ref 150.0–400.0)
RBC: 4.73 Mil/uL (ref 4.22–5.81)
RDW: 13.6 % (ref 11.5–15.5)
WBC: 7.2 10*3/uL (ref 4.0–10.5)

## 2016-07-10 LAB — HEMOGLOBIN A1C: Hgb A1c MFr Bld: 6.9 % — ABNORMAL HIGH (ref 4.6–6.5)

## 2016-07-10 LAB — PSA: PSA: 1.73 ng/mL (ref 0.10–4.00)

## 2016-07-10 MED ORDER — GLUCOSE BLOOD VI STRP: ORAL_STRIP | 3 refills | Status: DC

## 2016-07-10 MED ORDER — ATORVASTATIN CALCIUM 20 MG PO TABS: ORAL_TABLET | ORAL | 3 refills | Status: DC

## 2016-07-10 NOTE — Assessment & Plan Note (Signed)
S: well controlled on atorvastatin 20mg  twice a week previously in past. Myalgias even on twice a week. .  Lab Results  Component Value Date   CHOL 145 05/29/2015   HDL 54.90 05/29/2015   LDLCALC 72 05/29/2015   TRIG 93.0 05/29/2015   CHOLHDL 3 05/29/2015   A/P: update lipids today, may try once a week with myalgias

## 2016-07-10 NOTE — Addendum Note (Signed)
Addended by: Lucianne Lei M on: 07/10/2016 09:10 AM   Modules accepted: Orders

## 2016-07-10 NOTE — Progress Notes (Signed)
Phone: [phone]  Subjective:  Patient presents today for their annual physical. Chief complaint-noted.   See problem oriented charting- ROS- full  review of systems was completed and negative including no low blood sugars. No chest pain or shortness of breath. No headache or blurry vision.   The following were reviewed and entered/updated in epic: Past Medical History:  Diagnosis Date  . Diabetes mellitus   . Hypertension   . Palpitations 06/10/2010   Patient Active Problem List   Diagnosis Date Noted  . Type 2 diabetes mellitus without complications ([place]) 0000000    Priority: High  . Hyperlipidemia 11/26/2014    Priority: Medium  . Essential hypertension 12/04/2009    Priority: Medium  . Anxiety state 11/27/2014    Priority: Low  . GERD (gastroesophageal reflux disease) 11/14/2012    Priority: Low   Past Surgical History:  Procedure Laterality Date  . APPENDECTOMY  1957  . [place], 2015, 2016   Torn meniscus L scope 2016, R 2015, L 1991 scope  . 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 Stroke Mother   . Diabetes Mother   . Osteoporosis Mother   . Dementia Mother   . Heart disease Mother     pacemaker  . Hyperlipidemia Father     diet controlled  . Arthritis Sister   . Hyperthyroidism Sister   . CAD Father     stents in 63s or 34s    Medications- reviewed and updated Current Outpatient Prescriptions  Medication Sig Dispense Refill  . aspirin 81 MG tablet Take 81 mg by mouth daily.      . benazepril-hydrochlorthiazide (LOTENSIN HCT) 20-25 MG tablet TAKE 1 TABLET EVERY DAY 90 tablet 3  . felodipine (PLENDIL) 5 MG 24 hr tablet TAKE 1 TABLET BY MOUTH EVERY DAY 90 tablet 2  . metFORMIN (GLUCOPHAGE-XR) 500 MG 24 hr tablet TAKE 2 TABLETS BY MOUTH TWICE A DAY WITH A MEAL 360 tablet 2  . ONE TOUCH ULTRA TEST test strip USE AS INSTRUCTED 100 each 0  . atorvastatin (LIPITOR) 20 MG  tablet Twice a week (Mon, fridays) (Patient not taking: Reported on 07/10/2016) 30 tablet 3   No current facility-administered medications for this visit.     Allergies-reviewed and updated No Known Allergies  Social History   Social History  . Marital status: Single    Spouse name: N/A  . Number of children: N/A  . Years of education: N/A   Social History Main Topics  . Smoking status: Never Smoker  . Smokeless tobacco: Never Used  . Alcohol use 0.6 - 4.2 oz/week    1 - 7 Standard drinks or equivalent per week  . Drug use: No  . Sexual activity: Not Asked   Other Topics Concern  . None   Social History Narrative   Married. 1 daughter, 2 step sons. 8 grandkids. No greatgrandkids.       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 Oropharynx normal  Neck: no thyromegaly CV: RRR no murmurs rubs or gallops Lungs: CTAB no crackles, wheeze, rhonchi Abdomen: soft/nontender/nondistended/normal bowel sounds. No rebound or guarding.  Rectal: normal tone, diffusely enlarged prostate- very mild asymmetry on right, no masses or tenderness Ext: no edema Skin: warm, dry Neuro: grossly normal, moves all extremities, PERRLA  Diabetic Foot Exam - Simple   Simple Foot Form Diabetic Foot exam was performed with the following findings:  Yes 07/10/2016  8:49 AM  Visual Inspection No deformities, no ulcerations, no other skin breakdown bilaterally:  Yes Sensation Testing Intact to touch and monofilament testing bilaterally:  Yes Pulse Check Posterior Tibialis and Dorsalis pulse intact bilaterally:  Yes Comments    Assessment/Plan:  75 y.o. male  presenting for annual physical.  Health Maintenance counseling: 1. Anticipatory guidance: Patient counseled regarding regular dental exams, eye exams, wearing seatbelts.  2. Risk factor reduction:  Advised patient of need for regular exercise and diet rich and fruits and vegetables to reduce risk of heart attack and stroke. Weight goal 200. Last year was doing stationary bike at least 10 minutes a day- today states has been down since wife broke ankle. Wife broke ankle 12 weeks ago. Improved food choices/portion size Wt Readings from Last 3 Encounters:  07/10/16 210 lb 12.8 oz (95.6 kg)  05/29/15 219 lb (99.3 kg)  05/02/15 217 lb (98.4 kg)  3. Immunizations/screenings/ancillary studies Immunization History  Administered Date(s) Administered  . Influenza Split 08/10/2011, 08/29/2012  . Influenza Whole 12/04/2009, 09/10/2010  . Influenza,inj,Quad PF,36+ Mos 07/19/2013, 10/02/2014, 09/06/2015  . Pneumococcal Conjugate-13 11/27/2014  . Pneumococcal Polysaccharide-23 12/04/2009  . Td 11/09/2006  . Zoster 10/15/2011   Health Maintenance Due  Topic Date Due  . FOOT EXAM - today , normal 07/19/2015  . HEMOGLOBIN A1C - today  11/29/2015  . INFLUENZA VACCINE - today  06/09/2016   4. Prostate cancer screening-  opted out in 2016 with shared decision making. Declined rectal at that time.  Stable nocturia 2x a night- now actually down to once a night. Screening on his mind- due to brother in law with cancer and a close friend. He requests. Does have BPH and slight asymmetry on right side- we discussed if PSA trending up may get urology consult Lab Results  Component Value Date   PSA 2.07 02/19/2014   PSA 1.82 11/14/2012   PSA 1.33 12/04/2009   5. 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 Skin cancer screening- yearly with derm Dr. Ubaldo Glassing  Status of chronic or acute concerns   Type 2  diabetes mellitus without complications S: compliant with metformin 500mg  XR 2 tablets twice a day. Checks every other day- 30 day average 124. 135 in morning, before dinner or lunch 110 or 115.  A/P: update a1c, has not followed up with Dr. Cruzita Lederer as planned  Hyperlipidemia S: well controlled on atorvastatin 20mg  twice a week previously in past. Myalgias even on twice a week. .  Lab Results  Component Value Date   CHOL 145 05/29/2015   HDL 54.90 05/29/2015   LDLCALC 72 05/29/2015   TRIG 93.0 05/29/2015   CHOLHDL 3 05/29/2015   A/P: update lipids today, may try once a week with myalgias  Essential hypertension S: controlled on felodipine XL 5mg , benazepril HCT 20-25mg  BP Readings from Last 3 Encounters:  07/10/16 130/82  05/29/15 130/72  05/02/15 120/72  A/P:Continue current  meds:  Well controlled   GERD (gastroesophageal reflux disease) S: controlled on h2 blocker in past A/P: continue current medicine- now down to rolaids prn  15months  Orders Placed This Encounter  Procedures  . CBC    [place]  . Comprehensive metabolic panel    [place]    Order Specific Question:   Has the patient fasted?    Answer:   No  . Lipid panel    [place]    Order Specific Question:   Has the patient fasted?    Answer:   No  . Hemoglobin A1c    [place]  . PSA   High dose flu shot  Return precautions advised.   Garret Reddish, MD

## 2016-07-10 NOTE — Assessment & Plan Note (Signed)
S: controlled on h2 blocker in past A/P: continue current medicine- now down to rolaids prn

## 2016-07-10 NOTE — Patient Instructions (Signed)
High dose flu shot  Labs before you leave

## 2016-07-10 NOTE — Assessment & Plan Note (Signed)
S: compliant with metformin 500mg  XR 2 tablets twice a day. Checks every other day- 30 day average 124. 135 in morning, before dinner or lunch 110 or 115.  A/P: update a1c, has not followed up with Dr. Cruzita Lederer as planned

## 2016-07-10 NOTE — Assessment & Plan Note (Signed)
S: controlled on felodipine XL 5mg , benazepril HCT 20-25mg  BP Readings from Last 3 Encounters:  07/10/16 130/82  05/29/15 130/72  05/02/15 120/72  A/P:Continue current meds:  Well controlled

## 2016-08-27 ENCOUNTER — Other Ambulatory Visit: Payer: Self-pay | Admitting: Family Medicine

## 2016-09-23 ENCOUNTER — Other Ambulatory Visit: Payer: Self-pay | Admitting: Family Medicine

## 2016-12-29 ENCOUNTER — Ambulatory Visit: Payer: Medicare Other

## 2016-12-30 ENCOUNTER — Ambulatory Visit (INDEPENDENT_AMBULATORY_CARE_PROVIDER_SITE_OTHER): Payer: Medicare Other

## 2016-12-30 DIAGNOSIS — Z23 Encounter for immunization: Secondary | ICD-10-CM | POA: Diagnosis not present

## 2016-12-30 NOTE — Progress Notes (Signed)
Patient got his TDAP on 12/30/2016 at 8.50am. Given by Rosealee Albee CMA.

## 2017-04-29 ENCOUNTER — Ambulatory Visit (INDEPENDENT_AMBULATORY_CARE_PROVIDER_SITE_OTHER): Payer: Medicare Other | Admitting: Family Medicine

## 2017-04-29 ENCOUNTER — Encounter: Payer: Self-pay | Admitting: Family Medicine

## 2017-04-29 VITALS — BP 118/60 | HR 78 | Temp 98.5°F | Ht 69.0 in | Wt 214.2 lb

## 2017-04-29 DIAGNOSIS — L237 Allergic contact dermatitis due to plants, except food: Secondary | ICD-10-CM | POA: Diagnosis not present

## 2017-04-29 DIAGNOSIS — Z0000 Encounter for general adult medical examination without abnormal findings: Secondary | ICD-10-CM

## 2017-04-29 DIAGNOSIS — E119 Type 2 diabetes mellitus without complications: Secondary | ICD-10-CM

## 2017-04-29 LAB — POCT GLYCOSYLATED HEMOGLOBIN (HGB A1C): HEMOGLOBIN A1C: 6.8

## 2017-04-29 MED ORDER — TRIAMCINOLONE ACETONIDE 0.1 % EX CREA: 1.0000 "application " | TOPICAL_CREAM | Freq: Two times a day (BID) | CUTANEOUS | 0 refills | Status: DC

## 2017-04-29 NOTE — Progress Notes (Signed)
Subjective:   Walter Campbell is a 76 y.o. male who presents for an Initial Medicare Annual Wellness Visit.  Review of Systems  No ROS.  Medicare Wellness Visit. Additional risk factors are reflected in the social history.  Cardiac Risk Factors include: diabetes mellitus;advanced age (>31men, >81 women);hypertension;family history of premature cardiovascular disease;male gender   Sleep patterns: Sleeps 7 hours, feels rested. Up to void x 1.  Home Safety/Smoke Alarms: Feels safe in home. Smoke alarms in place.  Living environment; residence and Firearm Safety: Lives with wife in 2 story home.Will be moving this September to 2 story home Chiropractor on first level).   Seat Belt Safety/Bike Helmet: Wears seat belt.   Counseling:   Eye Exam-Last exam 11/2016, yearly Dr. Delman [facility]-Last exam 02/2017, every 3 month.    Male:   [facility]-Colonoscopy   09/11/2013, polyp. Recall 5 years.   PSA-  Lab Results  Component Value Date   PSA 1.73 07/10/2016   PSA 2.07 02/19/2014   PSA 1.82 11/14/2012      Objective:    Today's Vitals   04/29/17 1509  BP: 118/60  Pulse: 78  Temp: 98.5 F (36.9 C)  TempSrc: Oral  SpO2: 95%  Weight: 214 lb 3.2 oz (97.2 kg)  Height: 5\' 9"  (1.753 m)   Body mass index is 31.63 kg/m.  Current Medications (verified) Outpatient Encounter Prescriptions as of 04/29/2017  Medication Sig  . aspirin 81 MG tablet Take 81 mg by mouth daily.    Marland Kitchen atorvastatin (LIPITOR) 20 MG tablet Twice a week (Mon, fridays)  . benazepril-hydrochlorthiazide (LOTENSIN HCT) 20-25 MG tablet TAKE 1 TABLET EVERY DAY  . felodipine (PLENDIL) 5 MG 24 hr tablet TAKE 1 TABLET BY MOUTH EVERY DAY  . glucose blood (ONE TOUCH ULTRA TEST) test strip Check Blood Sugar Daily  . metFORMIN (GLUCOPHAGE-XR) 500 MG 24 hr tablet TAKE 2 TABLETS BY MOUTH TWICE A DAY WITH A MEAL  . triamcinolone cream (KENALOG) 0.1 % Apply 1 application topically 2 (two) times daily. For 7-10 days maximum   No  facility-administered encounter medications on file as of 04/29/2017.     Allergies (verified) Patient has no known allergies.   History: Past Medical History:  Diagnosis Date  . Diabetes mellitus   . Hypertension   . Palpitations 06/10/2010   Past Surgical History:  Procedure Laterality Date  . APPENDECTOMY  1957  . [place], 2015, 2016   Torn meniscus L scope 2016, R 2015, L 1991 scope  . 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 Stroke Mother   . Diabetes Mother   . Osteoporosis Mother   . Dementia Mother   . Heart disease Mother        pacemaker  . Hyperlipidemia Father        diet controlled  . Arthritis Sister   . Hyperthyroidism Sister   . CAD Father        stents in 54s or 37s   Social History   Occupational History  . Not on file.   Social History Main Topics  . Smoking status: Never Smoker  . Smokeless tobacco: Never Used  . Alcohol use 0.6 - 4.2 oz/week    1 - 7 Standard drinks or equivalent per week  . Drug use: No  . Sexual activity: Not on file   Tobacco Counseling Counseling given: Not Answered  Activities of Daily Living In your present state of health, do you have any difficulty performing the following activities: 04/29/2017  Hearing? N  Vision? N  Difficulty concentrating or making decisions? N  Walking or climbing stairs? N  Dressing or bathing? N  Doing errands, shopping? N  Preparing Food and eating ? N  Using the Toilet? N  In the past six months, have you accidently leaked urine? N  Do you have problems with loss of bowel control? N  Managing your Medications? N  Managing your Finances? N  Housekeeping or managing your Housekeeping? N  Some recent data might be hidden    Immunizations and Health Maintenance Immunization History  Administered Date(s) Administered  . Influenza Split 08/10/2011, 08/29/2012  . Influenza Whole 12/04/2009, 09/10/2010    . Influenza, High Dose Seasonal PF 07/10/2016  . Influenza,inj,Quad PF,36+ Mos 07/19/2013, 10/02/2014, 09/06/2015  . Pneumococcal Conjugate-13 11/27/2014  . Pneumococcal Polysaccharide-23 12/04/2009  . Td 11/09/2006  . Tdap 12/30/2016  . Zoster 10/15/2011   Health Maintenance Due  Topic Date Due  . OPHTHALMOLOGY EXAM  11/12/2016  . HEMOGLOBIN A1C  01/07/2017    Patient Care Team: Marin Olp, MD as PCP - General (Family Medicine) Rolm Bookbinder, MD as Consulting Physician (Dermatology) Marchia Bond, MD as Consulting Physician (Orthopedic Surgery)  Indicate any recent Medical Services you may have received from other than [facility] providers in the past year (date may be approximate).    Assessment:   This is a routine wellness examination for [employer]. Physical assessment deferred to PCP.   Hearing/Vision screen  Hearing Screening   Method: Audiometry   125Hz  250Hz  500Hz  1000Hz  2000Hz  3000Hz  4000Hz  6000Hz  8000Hz   Right ear:   Pass Pass Pass  Fail    Left ear:   Pass Pass Pass  Fail    Vision Screening Comments: Wears glasses.   Dietary issues and exercise activities discussed: Current Exercise Habits: Structured exercise class, Type of exercise: yoga (yard work, Office manager), Time (Minutes): 30, Frequency (Times/Week): 3, Weekly Exercise (Minutes/Week): 90, Exercise limited by: None identified   Diet (meal preparation, eat out, water intake, caffeinated beverages, dairy products, fruits and vegetables): Drinks water, gatorade, soda (1/week), beer (3/week)  Breakfast: eggs, bacon, cereal Lunch: sandwich Dinner: lean protein, vegetables  Discussed heart healthy diet and maintaining activity level.   Goals    . Weight (lb) < 200 lb (90.7 kg)          Lose weight by diet control      Depression Screen PHQ 2/9 Scores 04/29/2017 07/10/2016 05/02/2015 02/19/2014  PHQ - 2 Score 0 0 0 0    Fall Risk Fall Risk  04/29/2017 07/10/2016 05/02/2015 02/19/2014  Falls in the past year? No  Yes No No  Number falls in past yr: - 1 - -  Injury with Fall? - No - -    Cognitive Function:       Ad8 score reviewed for issues:  Issues making decisions: no  Less interest in hobbies / activities: no  Repeats questions, stories (family complaining): no  Trouble using ordinary gadgets (microwave, computer, phone): no  Forgets the month or year: no  Mismanaging finances: no  Remembering appts: no  Daily problems with thinking and/or memory: no Ad8 score is=0     Screening Tests Health Maintenance  Topic Date Due  . OPHTHALMOLOGY EXAM  11/12/2016  . HEMOGLOBIN A1C  01/07/2017  . INFLUENZA VACCINE  06/09/2017  . FOOT EXAM  07/10/2017  . COLONOSCOPY  09/12/2023  . TETANUS/TDAP  12/30/2026  . PNA vac Low Risk Adult  Completed        Plan:     Continue doing brain stimulating activities (puzzles, reading, adult coloring books, staying active) to keep memory sharp.   Bring a copy of your advance directives to your next office visit.   I have personally reviewed and noted the following in the patient's chart:   . Medical and social history . Use of alcohol, tobacco or illicit drugs  . Current medications and supplements . Functional ability and status . Nutritional status . Physical activity . Advanced directives . List of other physicians . Hospitalizations, surgeries, and ER visits in previous 12 months . Vitals . Screenings to include cognitive, depression, and falls . Referrals and appointments  In addition, I have reviewed and discussed with patient certain preventive protocols, quality metrics, and best practice recommendations. A written personalized care plan for preventive services as well as general preventive health recommendations were provided to patient.     Gerilyn Nestle, RN   04/29/2017    I have reviewed and agree with note, evaluation, plan.   Garret Reddish, MD

## 2017-04-29 NOTE — Assessment & Plan Note (Signed)
Has not followed up as planned at 6 months. Used opportunity of acute to update a1c and luckily well controlled. Continue current meds and follow up at CPE in septemer

## 2017-04-29 NOTE — Progress Notes (Signed)
Subjective:  Walter Campbell is a 76 y.o. year old very pleasant male patient who presents for/with See problem oriented charting ROS- see ROS below   Past Medical History-  Patient Active Problem List   Diagnosis Date Noted  . Type 2 diabetes mellitus without complications ([place]) 19/62/2297    Priority: High  . Hyperlipidemia 11/26/2014    Priority: Medium  . Essential hypertension 12/04/2009    Priority: Medium  . Anxiety state 11/27/2014    Priority: Low  . GERD (gastroesophageal reflux disease) 11/14/2012    Priority: Low    Medications- reviewed and updated Current Outpatient Prescriptions  Medication Sig Dispense Refill  . aspirin 81 MG tablet Take 81 mg by mouth daily.      Marland Kitchen atorvastatin (LIPITOR) 20 MG tablet Twice a week (Mon, fridays) 30 tablet 3  . benazepril-hydrochlorthiazide (LOTENSIN HCT) 20-25 MG tablet TAKE 1 TABLET EVERY DAY 90 tablet 3  . felodipine (PLENDIL) 5 MG 24 hr tablet TAKE 1 TABLET BY MOUTH EVERY DAY 90 tablet 2  . glucose blood (ONE TOUCH ULTRA TEST) test strip Check Blood Sugar Daily 100 each 3  . metFORMIN (GLUCOPHAGE-XR) 500 MG 24 hr tablet TAKE 2 TABLETS BY MOUTH TWICE A DAY WITH A MEAL 360 tablet 2  . triamcinolone cream (KENALOG) 0.1 % Apply 1 application topically 2 (two) times daily. For 7-10 days maximum 80 g 0   No current facility-administered medications for this visit.     Objective: BP 118/60 (BP Location: Left Arm, Patient Position: Sitting, Cuff Size: Large)   Pulse 78   Temp 98.5 F (36.9 C) (Oral)   Ht 5\' 9"  (1.753 m)   Wt 214 lb 3.2 oz (97.2 kg)   SpO2 95%   BMI 31.63 kg/m  Gen: NAD, resting comfortably No lip or tongue swelling CV: RRR no murmurs rubs or gallops Lungs: CTAB no crackles, wheeze, rhonchi obese Ext: no edema Skin: warm, dry, Patches of erythema with some excoriation bilateral forearm and on left flank  Assessment/Plan:  Rash/contact dermatitis S:Rash on forearms after poision ivy contact several days  ago but then noted new spot left flank in last 24 hours. Pruritic but no pain. Had not washed his sheets (could be source of new spots as did wash clothes)- he agrees to wash today. Wakes up scratching in middle of night. [place] helping in daytime.  ROS-not ill appearing, no fever/chills. No new medications. Not immunocompromised. No mucus membrane involvement.  A/P: no red flags. Contact dermatitis. Avoid systemic agent with lmited area affected- trial triamcinolone. Use benadryl before bed given waking up at night with itching.  Type 2 diabetes mellitus without complications ([place]) Has not followed up as planned at 6 months. Used opportunity of acute to update a1c and luckily well controlled. Continue current meds and follow up at CPE in septemer  Orders Placed This Encounter  Procedures  . POCT glycosylated hemoglobin (Hb A1C)   Meds ordered this encounter  Medications  . triamcinolone cream (KENALOG) 0.1 %    Sig: Apply 1 application topically 2 (two) times daily. For 7-10 days maximum    Dispense:  80 g    Refill:  0   Return precautions advised.  Garret Reddish, MD

## 2017-04-29 NOTE — Patient Instructions (Addendum)
Triamcinolone twice a day for 7-10 days. Hopeful rash will at least calm down by that time though may stick around for another week  a1c should be ready by the time you finish with Maudie Mercury our RN health coach who is doing your annual wellness visit  I will see you in September (will be too early for a1c since we are doing it today but we will update other labs so come fasting)  Continue doing brain stimulating activities (puzzles, reading, adult coloring books, staying active) to keep memory sharp.   Bring a copy of your advance directives to your next office visit.   Health Maintenance, Male A healthy lifestyle and preventive care is important for your health and wellness. Ask your health care provider about what schedule of regular examinations is right for you. What should I know about weight and diet? Eat a Healthy Diet  Eat plenty of vegetables, fruits, whole grains, low-fat dairy products, and lean protein.  Do not eat a lot of foods high in solid fats, added sugars, or salt.  Maintain a Healthy Weight Regular exercise can help you achieve or maintain a healthy weight. You should:  Do at least 150 minutes of exercise each week. The exercise should increase your heart rate and make you sweat (moderate-intensity exercise).  Do strength-training exercises at least twice a week.  Watch Your Levels of Cholesterol and Blood Lipids  Have your blood tested for lipids and cholesterol every 5 years starting at 75 years of age. If you are at high risk for heart disease, you should start having your blood tested when you are 75 years old. You may need to have your cholesterol levels checked more often if: ? Your lipid or cholesterol levels are high. ? You are older than 75 years of age. ? You are at high risk for heart disease.  What should I know about cancer screening? Many types of cancers can be detected early and may often be prevented. Lung Cancer  You should be screened every year  for lung cancer if: ? You are a current smoker who has smoked for at least 30 years. ? You are a former smoker who has quit within the past 15 years.  Talk to your health care provider about your screening options, when you should start screening, and how often you should be screened.  Colorectal Cancer  Routine colorectal cancer screening usually begins at 75 years of age and should be repeated every 5-10 years until you are 75 years old. You may need to be screened more often if early forms of precancerous polyps or small growths are found. Your health care provider may recommend screening at an earlier age if you have risk factors for colon cancer.  Your health care provider may recommend using home test kits to check for hidden blood in the stool.  A small camera at the end of a tube can be used to examine your colon (sigmoidoscopy or colonoscopy). This checks for the earliest forms of colorectal cancer.  Prostate and Testicular Cancer  Depending on your age and overall health, your health care provider may do certain tests to screen for prostate and testicular cancer.  Talk to your health care provider about any symptoms or concerns you have about testicular or prostate cancer.  Skin Cancer  Check your skin from head to toe regularly.  Tell your health care provider about any new moles or changes in moles, especially if: ? There is a change in a  mole's size, shape, or color. ? You have a mole that is larger than a pencil eraser.  Always use sunscreen. Apply sunscreen liberally and repeat throughout the day.  Protect yourself by wearing long sleeves, pants, a wide-brimmed hat, and sunglasses when outside.  What should I know about heart disease, diabetes, and high blood pressure?  If you are 33-60 years of age, have your blood pressure checked every 3-5 years. If you are 41 years of age or older, have your blood pressure checked every year. You should have your blood pressure  measured twice-once when you are at a hospital or clinic, and once when you are not at a hospital or clinic. Record the average of the two measurements. To check your blood pressure when you are not at a hospital or clinic, you can use: ? An automated blood pressure machine at a pharmacy. ? A home blood pressure monitor.  Talk to your health care provider about your target blood pressure.  If you are between 6-62 years old, ask your health care provider if you should take aspirin to prevent heart disease.  Have regular diabetes screenings by checking your fasting blood sugar level. ? If you are at a normal weight and have a low risk for diabetes, have this test once every three years after the age of 5. ? If you are overweight and have a high risk for diabetes, consider being tested at a younger age or more often.  A one-time screening for abdominal aortic aneurysm (AAA) by ultrasound is recommended for men aged 31-75 years who are current or former smokers. What should I know about preventing infection? Hepatitis B If you have a higher risk for hepatitis B, you should be screened for this virus. Talk with your health care provider to find out if you are at risk for hepatitis B infection. Hepatitis C Blood testing is recommended for:  Everyone born from [birth year] through 1964.  Anyone with known risk factors for hepatitis C.  Sexually Transmitted Diseases (STDs)  You should be screened each year for STDs including gonorrhea and chlamydia if: ? You are sexually active and are younger than 75 years of age. ? You are older than 75 years of age and your health care provider tells you that you are at risk for this type of infection. ? Your sexual activity has changed since you were last screened and you are at an increased risk for chlamydia or gonorrhea. Ask your health care provider if you are at risk.  Talk with your health care provider about whether you are at high risk of being infected  with HIV. Your health care provider may recommend a prescription medicine to help prevent HIV infection.  What else can I do?  Schedule regular health, dental, and eye exams.  Stay current with your vaccines (immunizations).  Do not use any tobacco products, such as cigarettes, chewing tobacco, and e-cigarettes. If you need help quitting, ask your health care provider.  Limit alcohol intake to no more than 2 drinks per day. One drink equals 12 ounces of beer, 5 ounces of wine, or 1 ounces of hard liquor.  Do not use street drugs.  Do not share needles.  Ask your health care provider for help if you need support or information about quitting drugs.  Tell your health care provider if you often feel depressed.  Tell your health care provider if you have ever been abused or do not feel safe at home. This information is  not intended to replace advice given to you by your health care provider. Make sure you discuss any questions you have with your health care provider. Document Released: 04/23/2008 Document Revised: 06/24/2016 Document Reviewed: 07/30/2015 Elsevier Interactive Patient Education  [employer].

## 2017-05-14 ENCOUNTER — Telehealth: Payer: Self-pay | Admitting: Family Medicine

## 2017-05-14 NOTE — Telephone Encounter (Signed)
Pt states he has heard there is a new sugar blood monitor that does not require a finger stick. Pt not sure how its done, but would like to know if you are familiar and will order one for him.  [id number] - [place], [place] - [address] AT [address]

## 2017-05-19 NOTE — Telephone Encounter (Signed)
I called [facility] and spoke with the pharmacist. The system is called the [facility]. The meter is $84 and the sensors that last 10 days are $43 a piece. I called the patient and informed him. He opts at this time not to pursue this option due to cost.

## 2017-06-09 ENCOUNTER — Other Ambulatory Visit: Payer: Self-pay | Admitting: Family Medicine

## 2017-06-10 ENCOUNTER — Other Ambulatory Visit: Payer: Self-pay | Admitting: Family Medicine

## 2017-07-14 ENCOUNTER — Encounter: Payer: Self-pay | Admitting: Family Medicine

## 2017-07-14 ENCOUNTER — Ambulatory Visit (INDEPENDENT_AMBULATORY_CARE_PROVIDER_SITE_OTHER): Payer: Medicare Other | Admitting: Family Medicine

## 2017-07-14 ENCOUNTER — Telehealth: Payer: Self-pay | Admitting: Family Medicine

## 2017-07-14 VITALS — BP 122/64 | HR 63 | Temp 98.2°F | Ht 69.0 in | Wt 209.6 lb

## 2017-07-14 DIAGNOSIS — I10 Essential (primary) hypertension: Secondary | ICD-10-CM

## 2017-07-14 DIAGNOSIS — E119 Type 2 diabetes mellitus without complications: Secondary | ICD-10-CM | POA: Diagnosis not present

## 2017-07-14 DIAGNOSIS — N401 Enlarged prostate with lower urinary tract symptoms: Secondary | ICD-10-CM

## 2017-07-14 DIAGNOSIS — R972 Elevated prostate specific antigen [PSA]: Secondary | ICD-10-CM | POA: Diagnosis not present

## 2017-07-14 DIAGNOSIS — R351 Nocturia: Secondary | ICD-10-CM | POA: Diagnosis not present

## 2017-07-14 DIAGNOSIS — Z0001 Encounter for general adult medical examination with abnormal findings: Secondary | ICD-10-CM

## 2017-07-14 DIAGNOSIS — Z79899 Other long term (current) drug therapy: Secondary | ICD-10-CM

## 2017-07-14 DIAGNOSIS — E785 Hyperlipidemia, unspecified: Secondary | ICD-10-CM | POA: Diagnosis not present

## 2017-07-14 DIAGNOSIS — Z0000 Encounter for general adult medical examination without abnormal findings: Secondary | ICD-10-CM

## 2017-07-14 LAB — LIPID PANEL
CHOL/HDL RATIO: 3
CHOLESTEROL: 139 mg/dL (ref 0–200)
HDL: 47.6 mg/dL (ref >39.00)
LDL CALC: 73 mg/dL (ref 0–99)
NonHDL: 91.03
TRIGLYCERIDES: 90 mg/dL (ref 0.0–149.0)
VLDL: 18 mg/dL (ref 0.0–40.0)

## 2017-07-14 LAB — VITAMIN B12: Vitamin B-12: 240 pg/mL (ref 211–911)

## 2017-07-14 LAB — CBC
HEMATOCRIT: 42 % (ref 39.0–52.0)
HEMOGLOBIN: 13.9 g/dL (ref 13.0–17.0)
MCHC: 33 g/dL (ref 30.0–36.0)
MCV: 90.1 fl (ref 78.0–100.0)
PLATELETS: 251 10*3/uL (ref 150.0–400.0)
RBC: 4.67 Mil/uL (ref 4.22–5.81)
RDW: 13.7 % (ref 11.5–15.5)
WBC: 7 10*3/uL (ref 4.0–10.5)

## 2017-07-14 LAB — PSA: PSA: 2.52 ng/mL (ref 0.10–4.00)

## 2017-07-14 LAB — COMPREHENSIVE METABOLIC PANEL
ALBUMIN: 4.1 g/dL (ref 3.5–5.2)
ALT: 17 U/L (ref 0–53)
AST: 15 U/L (ref 0–37)
Alkaline Phosphatase: 53 U/L (ref 39–117)
BUN: 16 mg/dL (ref 6–23)
CALCIUM: 9.4 mg/dL (ref 8.4–10.5)
CHLORIDE: 102 meq/L (ref 96–112)
CO2: 29 meq/L (ref 19–32)
CREATININE: 0.83 mg/dL (ref 0.40–1.50)
GFR: 96.04 mL/min (ref >60.00)
Glucose, Bld: 142 mg/dL — ABNORMAL HIGH (ref 70–99)
POTASSIUM: 4 meq/L (ref 3.5–5.1)
Sodium: 139 mEq/L (ref 135–145)
Total Bilirubin: 0.6 mg/dL (ref 0.2–1.2)
Total Protein: 6.1 g/dL (ref 6.0–8.3)

## 2017-07-14 NOTE — Assessment & Plan Note (Signed)
HTN- controlled on felodipine 5mg  and benazepril-hctz 20-25 mg BP Readings from Last 3 Encounters:  07/14/17 122/64  04/29/17 118/60  07/10/16 130/82

## 2017-07-14 NOTE — Patient Instructions (Addendum)
Things look pretty good today. Like the weight loss. Would love to see you trim another 5 lbs off in next 4 months.   No changes in meds unless labs lead [place] to that  Please stop by lab before you go  I would also like for you to sign up for an annual wellness visit with our nurse, Manuela Schwartz, who specializes in the annual wellness exam. This is a free/covered benefit under medicare that may help [place] find additional ways to help you. Some highlights are reviewing medications, lifestyle, and doing a dementia screen.

## 2017-07-14 NOTE — Assessment & Plan Note (Signed)
DM 2- too early for repeat- controlled on last check on metformin XR 1 g BID. On metformin had loose stools Lab Results  Component Value Date   HGBA1C 6.8 04/29/2017

## 2017-07-14 NOTE — Assessment & Plan Note (Signed)
HLD- very mild poorly controlled on last check. Update lipids today. On atorvastatin 20mg  twice a week. Also on aspirin 81mg  at least once a week Lab Results  Component Value Date   CHOL 181 07/10/2016   HDL 50.70 07/10/2016   LDLCALC 101 (H) 07/10/2016   TRIG 146.0 07/10/2016   CHOLHDL 4 07/10/2016

## 2017-07-14 NOTE — Addendum Note (Signed)
Addended by: Abelardo Diesel on: 07/14/2017 02:59 PM   Modules accepted: Orders

## 2017-07-14 NOTE — Progress Notes (Signed)
Phone: [phone]  Subjective:  Patient presents today for their annual physical. Chief complaint-noted.   See problem oriented charting- ROS- full  review of systems was completed and negative including No chest pain or shortness of breath. No headache or blurry vision. Nocturia persists but not worsening  The following were reviewed and entered/updated in epic: Past Medical History:  Diagnosis Date  . Diabetes mellitus   . Hypertension   . Palpitations 06/10/2010   Patient Active Problem List   Diagnosis Date Noted  . Type 2 diabetes mellitus without complications ([place]) 02/72/5366    Priority: High  . Hyperlipidemia 11/26/2014    Priority: Medium  . Essential hypertension 12/04/2009    Priority: Medium  . Anxiety state 11/27/2014    Priority: Low  . GERD (gastroesophageal reflux disease) 11/14/2012    Priority: Low   Past Surgical History:  Procedure Laterality Date  . APPENDECTOMY  1957  . [place], 2015, 2016   Torn meniscus L scope 2016, R 2015, L 1991 scope  . 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 Stroke Mother   . Diabetes Mother   . Osteoporosis Mother   . Dementia Mother   . Heart disease Mother        pacemaker  . Hyperlipidemia Father        diet controlled  . Arthritis Sister   . Hyperthyroidism Sister   . CAD Father        stents in 11s or 79s    Medications- reviewed and updated Current Outpatient Prescriptions  Medication Sig Dispense Refill  . aspirin 81 MG tablet Take 81 mg by mouth daily.      Marland Kitchen atorvastatin (LIPITOR) 20 MG tablet Twice a week (Mon, fridays) 30 tablet 3  . benazepril-hydrochlorthiazide (LOTENSIN HCT) 20-25 MG tablet TAKE 1 TABLET BY MOUTH EVERY DAY 174 tablet 0  . felodipine (PLENDIL) 5 MG 24 hr tablet TAKE 1 TABLET BY MOUTH EVERY DAY 90 tablet 2  . glucose blood (ONE TOUCH ULTRA TEST) test strip Check Blood Sugar Daily 100 each 3  .  metFORMIN (GLUCOPHAGE-XR) 500 MG 24 hr tablet TAKE 2 TABLETS BY MOUTH TWICE A DAY WITH A MEAL 360 tablet 2  . triamcinolone cream (KENALOG) 0.1 % Apply 1 application topically 2 (two) times daily. For 7-10 days maximum 80 g 0   No current facility-administered medications for this visit.     Allergies-reviewed and updated No Known Allergies  Social History   Social History  . Marital status: Single    Spouse name: N/A  . Number of children: N/A  . Years of education: N/A   Social History Main Topics  . Smoking status: Never Smoker  . Smokeless tobacco: Never Used  . Alcohol use 0.6 - 4.2 oz/week    1 - 7 Standard drinks or equivalent per week  . Drug use: No  . Sexual activity: Not Asked   Other Topics Concern  . None   Social History Narrative   Married. 1 daughter, 2 step sons. 8 grandkids. No greatgrandkids.       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 Oropharynx normal Neck: no thyromegaly CV: RRR no murmurs rubs or gallops Lungs: CTAB no crackles, wheeze, rhonchi Abdomen: soft/nontender/nondistended/normal bowel sounds. No rebound or guarding.  Ext: no edema Skin: warm, dry Neuro: grossly normal, moves all extremities, PERRLA Rectal: normal tone- slightly asymmetric on right side but unchanged from last year and no obvious nodules, diffusely enlarged prostate, no masses or tenderness  Diabetic Foot Exam - Simple   Simple Foot Form Diabetic Foot exam was performed with the following findings:  Yes 07/14/2017  8:40 AM  Visual Inspection No deformities, no ulcerations, no other skin breakdown bilaterally:  Yes Sensation  Testing Intact to touch and monofilament testing bilaterally:  Yes Pulse Check Posterior Tibialis and Dorsalis pulse intact bilaterally:  Yes Comments    Assessment/Plan:  76 y.o. male presenting for annual physical.  Health Maintenance counseling: 1. Anticipatory guidance: Patient counseled regarding regular dental exams -q3 months, eye exams -each january, wearing seatbelts.  2. Risk factor reduction:  Advised patient of need for regular exercise and diet rich and fruits and vegetables to reduce risk of heart attack and stroke. Exercise- pilates twice a week, golf 1-2x a week riding- hits the ball not so well so has to walk. Diet-decreased intake- states food hasnt tasted quite as good.  Wt Readings from Last 3 Encounters:  07/14/17 209 lb 9.6 oz (95.1 kg)  04/29/17 214 lb 3.2 oz (97.2 kg)  07/10/16 210 lb 12.8 oz (95.6 kg)  3. Immunizations/screenings/ancillary studies- advised fall flu shot. Can get shingrix in the fall.  Immunization History  Administered Date(s) Administered  . Influenza Split 08/10/2011, 08/29/2012  . Influenza Whole 12/04/2009, 09/10/2010  . Influenza, High Dose Seasonal PF 07/10/2016  . Influenza,inj,Quad PF,6+ Mos 07/19/2013, 10/02/2014, 09/06/2015  . Pneumococcal Conjugate-13 11/27/2014  . Pneumococcal Polysaccharide-23 12/04/2009  . Td 11/09/2006  . Tdap 12/30/2016  . Zoster 10/15/2011    4. Prostate cancer screening-  psa trend low risk. Slightly asymmetric on last exam along with BPH so repeated exam today and stable exam. Has nocturia once a night. Will update PSA likely this year and next and if everything stable discontinue screening.   Component Value Date   PSA 1.73 07/10/2016   PSA 2.07 02/19/2014   PSA 1.82 11/14/2012   5. 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 Skin cancer screening- follows with Dr. Ubaldo Glassing annually. advised regular  sunscreen use. Denies worrisome, changing, or new skin lesions.   Status of chronic or acute concerns   Get b12 with long term metformin  Prn rolaids for GERD- PPI in past   Hyperlipidemia HLD- very mild poorly controlled on last check. Update lipids today. On atorvastatin 20mg  twice a week. Also on aspirin 81mg  at least once a week Lab Results  Component Value Date   CHOL 181 07/10/2016   HDL 50.70 07/10/2016   LDLCALC 101 (H) 07/10/2016   TRIG 146.0 07/10/2016   CHOLHDL 4 07/10/2016    Type 2 diabetes mellitus without complications ([place]) DM 2- too early for repeat- controlled on last check on metformin XR 1 g BID. On metformin had loose stools Lab Results  Component Value Date   HGBA1C 6.8 04/29/2017     Essential hypertension HTN-  controlled on felodipine 5mg  and benazepril-hctz 20-25 mg BP Readings from Last 3 Encounters:  07/14/17 122/64  04/29/17 118/60  07/10/16 130/82   No Follow-up on file.  Orders Placed This Encounter  Procedures  . CBC    [place]  . Comprehensive metabolic panel    [place]    Order Specific Question:   Has the patient fasted?    Answer:   No  . Lipid panel    [place]    Order Specific Question:   Has the patient fasted?    Answer:   No  . Vitamin B12  . PSA    No orders of the defined types were placed in this encounter.   Return precautions advised.  Garret Reddish, MD

## 2017-07-14 NOTE — Telephone Encounter (Signed)
Pt returned the call stating that someone called to give him his lab result not sure as to who it was.

## 2017-07-14 NOTE — Telephone Encounter (Signed)
I called pt to give him lab results...  Just called and left another message on voicemail to call office.

## 2017-07-15 ENCOUNTER — Other Ambulatory Visit: Payer: Self-pay

## 2017-07-15 DIAGNOSIS — R972 Elevated prostate specific antigen [PSA]: Secondary | ICD-10-CM

## 2017-07-15 NOTE — Telephone Encounter (Signed)
Called and spoke with patient.

## 2017-07-24 ENCOUNTER — Other Ambulatory Visit: Payer: Self-pay | Admitting: Family Medicine

## 2017-08-20 ENCOUNTER — Ambulatory Visit (INDEPENDENT_AMBULATORY_CARE_PROVIDER_SITE_OTHER): Payer: Medicare Other

## 2017-08-20 DIAGNOSIS — Z23 Encounter for immunization: Secondary | ICD-10-CM | POA: Diagnosis not present

## 2017-09-28 ENCOUNTER — Ambulatory Visit: Payer: Medicare Other | Admitting: Family Medicine

## 2017-09-28 ENCOUNTER — Encounter: Payer: Self-pay | Admitting: Family Medicine

## 2017-09-28 VITALS — BP 118/60 | HR 79 | Temp 98.1°F | Wt 208.2 lb

## 2017-09-28 DIAGNOSIS — K219 Gastro-esophageal reflux disease without esophagitis: Secondary | ICD-10-CM

## 2017-09-28 NOTE — Progress Notes (Signed)
   Subjective:    Patient ID: Walter Campbell, male    DOB: November 17, 1940, 76 y.o.   MRN: [id number]  HPI Here for what he thinks are esophageal spasms. For the past month he has had several episodes of a squeezing pressure in the chest that comes and goes. It lasts about 5 to 15 minutes at a time. There is no real pain per se, no SOB or nasuea or sweats. These are not associated with exertion. He had a normal Myoview stress test in 2011. He has frequent heartburn and food sometimes gets stuck partway down when he swallows. He took Nexium for awhile with good results, but he stopped it due to concerns about long term side effects. He now uses TUMS.    Review of Systems  Constitutional: Negative.   Respiratory: Positive for chest tightness. Negative for apnea, cough, choking, shortness of breath, wheezing and stridor.   Cardiovascular: Negative for chest pain, palpitations and leg swelling.  Gastrointestinal: Negative.        Objective:   Physical Exam  Constitutional: He appears well-developed and well-nourished.  Neck: No thyromegaly present.  Cardiovascular: Normal rate, regular rhythm, normal heart sounds and intact distal pulses.  Pulmonary/Chest: Effort normal and breath sounds normal. No respiratory distress. He has no wheezes. He has no rales. He exhibits no tenderness.          Assessment & Plan:  He has known GERD and I agree that these spells are most likely esophageal spasms. I suggested he try taking Zantac 150 mg bid. Recheck prn. Alysia Penna, MD

## 2017-10-06 ENCOUNTER — Other Ambulatory Visit: Payer: Self-pay | Admitting: *Deleted

## 2017-10-06 MED ORDER — ATORVASTATIN CALCIUM 20 MG PO TABS: ORAL_TABLET | ORAL | 3 refills | Status: DC

## 2017-10-13 ENCOUNTER — Other Ambulatory Visit: Payer: Self-pay

## 2017-10-13 MED ORDER — BENAZEPRIL-HYDROCHLOROTHIAZIDE 20-25 MG PO TABS: 1.0000 | ORAL_TABLET | Freq: Every day | ORAL | 1 refills | Status: DC

## 2017-12-01 LAB — HM DIABETES EYE EXAM

## 2017-12-21 ENCOUNTER — Other Ambulatory Visit: Payer: Self-pay | Admitting: Oral Surgery

## 2018-01-26 ENCOUNTER — Telehealth: Payer: Self-pay | Admitting: Family Medicine

## 2018-01-26 NOTE — Telephone Encounter (Signed)
Pt needs to contact pharmacy for refill. 90 day supply was sent in 10/2017 with additional refill provided.

## 2018-01-26 NOTE — Telephone Encounter (Signed)
Copied from [place]. Topic: Quick Communication - Rx Refill/Question >> Jan 26, 2018 10:09 AM Neva Seat wrote: benazepril-hydrochlorthiazide (LOTENSIN HCT) 20-25 MG tablet  Pt needing refills  [id number] [place], [place] - [address]. AT [address] [place] [address] Phone: [phone] Fax: [phone]

## 2018-01-27 MED ORDER — BENAZEPRIL-HYDROCHLOROTHIAZIDE 20-25 MG PO TABS: 1.0000 | ORAL_TABLET | Freq: Every day | ORAL | 0 refills | Status: DC

## 2018-01-27 NOTE — Telephone Encounter (Signed)
Patient called to verify the correct pharmacy, he said [facility] so [facility] got [facility] to [facility]. Now he is needing a refill." [facility] and [facility], Technician who verified they do not have a recent prescription on file and they have enough medication in stock for a 90 day refill." [facility]

## 2018-02-23 ENCOUNTER — Encounter: Payer: Self-pay | Admitting: Family Medicine

## 2018-02-23 ENCOUNTER — Ambulatory Visit: Payer: Medicare Other | Admitting: Family Medicine

## 2018-02-23 VITALS — BP 112/58 | HR 69 | Temp 98.0°F | Ht 69.0 in | Wt 210.8 lb

## 2018-02-23 DIAGNOSIS — E119 Type 2 diabetes mellitus without complications: Secondary | ICD-10-CM

## 2018-02-23 DIAGNOSIS — I10 Essential (primary) hypertension: Secondary | ICD-10-CM

## 2018-02-23 DIAGNOSIS — Z794 Long term (current) use of insulin: Secondary | ICD-10-CM

## 2018-02-23 LAB — POCT GLYCOSYLATED HEMOGLOBIN (HGB A1C): Hemoglobin A1C: 6.8

## 2018-02-23 NOTE — Progress Notes (Signed)
   Subjective:    Patient ID: Walter Campbell, male    DOB: 09-27-1941, 77 y.o.   MRN: [id number]  HPI Here to follow up on diabetes and HTN. He feels well. His BP is stable at home. His A1c is 6.8 today.    Review of Systems  Constitutional: Negative.   Respiratory: Negative.   Cardiovascular: Negative.   Neurological: Negative.        Objective:   Physical Exam  Constitutional: He is oriented to person, place, and time. He appears well-developed and well-nourished.  Cardiovascular: Normal rate, regular rhythm, normal heart sounds and intact distal pulses.  Pulmonary/Chest: Effort normal and breath sounds normal. No respiratory distress. He has no wheezes. He has no rales.  Neurological: He is alert and oriented to person, place, and time.          Assessment & Plan:  HTN and DM are stable. We discussed diet and exercise. Alysia Penna, MD

## 2018-02-28 ENCOUNTER — Other Ambulatory Visit: Payer: Self-pay | Admitting: Family Medicine

## 2018-02-28 DIAGNOSIS — E119 Type 2 diabetes mellitus without complications: Secondary | ICD-10-CM

## 2018-03-15 ENCOUNTER — Telehealth: Payer: Self-pay | Admitting: Family Medicine

## 2018-03-15 NOTE — Telephone Encounter (Signed)
Copied from [place] [id number]. Topic: Quick Communication - Rx Refill/Question >> Mar 15, 2018 11:50 AM Bea Graff, NT wrote: Medication: felodipine (PLENDIL) 5 MG 24 hr tablet  Has the patient contacted their pharmacy? Yes.   (Agent: If no, request that the patient contact the pharmacy for the refill.) Preferred Pharmacy (with phone number or street name): [id number] - [place], [place]. AT [address] [phone] (Phone) [phone] (Fax)     Agent: Please be advised that RX refills may take up to 3 business days. We ask that you follow-up with your pharmacy.

## 2018-03-16 ENCOUNTER — Other Ambulatory Visit: Payer: Self-pay | Admitting: *Deleted

## 2018-03-16 MED ORDER — FELODIPINE ER 5 MG PO TB24: 5.0000 mg | ORAL_TABLET | Freq: Every day | ORAL | 1 refills | Status: DC

## 2018-03-16 NOTE — Telephone Encounter (Signed)
Rx refill per protocol. LOV: 02/23/18 - 6 mon f/u

## 2018-04-19 ENCOUNTER — Other Ambulatory Visit: Payer: Self-pay | Admitting: Family Medicine

## 2018-04-19 ENCOUNTER — Other Ambulatory Visit: Payer: Self-pay

## 2018-04-19 DIAGNOSIS — I10 Essential (primary) hypertension: Secondary | ICD-10-CM

## 2018-04-26 ENCOUNTER — Other Ambulatory Visit: Payer: Self-pay | Admitting: Family Medicine

## 2018-05-28 ENCOUNTER — Other Ambulatory Visit: Payer: Self-pay | Admitting: Family Medicine

## 2018-05-28 DIAGNOSIS — E119 Type 2 diabetes mellitus without complications: Secondary | ICD-10-CM

## 2018-07-23 ENCOUNTER — Other Ambulatory Visit: Payer: Self-pay | Admitting: Family Medicine

## 2018-07-28 ENCOUNTER — Other Ambulatory Visit: Payer: Self-pay | Admitting: Family Medicine

## 2018-07-28 DIAGNOSIS — I10 Essential (primary) hypertension: Secondary | ICD-10-CM

## 2018-09-12 ENCOUNTER — Ambulatory Visit (INDEPENDENT_AMBULATORY_CARE_PROVIDER_SITE_OTHER): Payer: Medicare Other | Admitting: *Deleted

## 2018-09-12 DIAGNOSIS — Z23 Encounter for immunization: Secondary | ICD-10-CM

## 2018-09-25 ENCOUNTER — Other Ambulatory Visit: Payer: Self-pay | Admitting: Family Medicine

## 2018-10-14 ENCOUNTER — Other Ambulatory Visit: Payer: Self-pay | Admitting: *Deleted

## 2018-10-14 MED ORDER — FELODIPINE ER 5 MG PO TB24: 5.0000 mg | ORAL_TABLET | Freq: Every day | ORAL | 1 refills | Status: DC

## 2018-10-24 ENCOUNTER — Other Ambulatory Visit: Payer: Self-pay | Admitting: Family Medicine

## 2018-10-24 DIAGNOSIS — I10 Essential (primary) hypertension: Secondary | ICD-10-CM

## 2018-11-21 ENCOUNTER — Ambulatory Visit (INDEPENDENT_AMBULATORY_CARE_PROVIDER_SITE_OTHER): Payer: Medicare Other | Admitting: Family Medicine

## 2018-11-21 ENCOUNTER — Encounter: Payer: Self-pay | Admitting: Family Medicine

## 2018-11-21 VITALS — BP 124/70 | HR 68 | Temp 98.3°F | Ht 68.5 in | Wt 209.2 lb

## 2018-11-21 DIAGNOSIS — E119 Type 2 diabetes mellitus without complications: Secondary | ICD-10-CM | POA: Diagnosis not present

## 2018-11-21 DIAGNOSIS — E785 Hyperlipidemia, unspecified: Secondary | ICD-10-CM

## 2018-11-21 DIAGNOSIS — N138 Other obstructive and reflux uropathy: Secondary | ICD-10-CM | POA: Diagnosis not present

## 2018-11-21 DIAGNOSIS — K219 Gastro-esophageal reflux disease without esophagitis: Secondary | ICD-10-CM | POA: Diagnosis not present

## 2018-11-21 DIAGNOSIS — I10 Essential (primary) hypertension: Secondary | ICD-10-CM

## 2018-11-21 DIAGNOSIS — N401 Enlarged prostate with lower urinary tract symptoms: Secondary | ICD-10-CM

## 2018-11-21 LAB — POC URINALSYSI DIPSTICK (AUTOMATED)
Bilirubin, UA: POSITIVE
Blood, UA: NEGATIVE
Glucose, UA: NEGATIVE
Ketones, UA: POSITIVE
Leukocytes, UA: NEGATIVE
Nitrite, UA: NEGATIVE
PROTEIN UA: POSITIVE — AB
Spec Grav, UA: >=1.03 — AB (ref 1.010–1.025)
Urobilinogen, UA: 0.2 E.U./dL
pH, UA: 5.5 (ref 5.0–8.0)

## 2018-11-21 LAB — CBC WITH DIFFERENTIAL/PLATELET
Basophils Absolute: 0.1 10*3/uL (ref 0.0–0.1)
Basophils Relative: 1 % (ref 0.0–3.0)
EOS PCT: 10.5 % — AB (ref 0.0–5.0)
Eosinophils Absolute: 0.8 10*3/uL — ABNORMAL HIGH (ref 0.0–0.7)
HCT: 42 % (ref 39.0–52.0)
Hemoglobin: 13.9 g/dL (ref 13.0–17.0)
Lymphocytes Relative: 21.8 % (ref 12.0–46.0)
Lymphs Abs: 1.7 10*3/uL (ref 0.7–4.0)
MCHC: 33 g/dL (ref 30.0–36.0)
MCV: 89.9 fl (ref 78.0–100.0)
MONO ABS: 0.5 10*3/uL (ref 0.1–1.0)
Monocytes Relative: 6 % (ref 3.0–12.0)
NEUTROS PCT: 60.7 % (ref 43.0–77.0)
Neutro Abs: 4.7 10*3/uL (ref 1.4–7.7)
Platelets: 252 10*3/uL (ref 150.0–400.0)
RBC: 4.68 Mil/uL (ref 4.22–5.81)
RDW: 13.8 % (ref 11.5–15.5)
WBC: 7.7 10*3/uL (ref 4.0–10.5)

## 2018-11-21 LAB — LIPID PANEL
Cholesterol: 146 mg/dL (ref 0–200)
HDL: 54.6 mg/dL (ref >39.00)
LDL Cholesterol: 69 mg/dL (ref 0–99)
NonHDL: 91.44
Total CHOL/HDL Ratio: 3
Triglycerides: 111 mg/dL (ref 0.0–149.0)
VLDL: 22.2 mg/dL (ref 0.0–40.0)

## 2018-11-21 LAB — TSH: TSH: 2.11 u[IU]/mL (ref 0.35–4.50)

## 2018-11-21 LAB — HEPATIC FUNCTION PANEL
ALT: 16 U/L (ref 0–53)
AST: 14 U/L (ref 0–37)
Albumin: 4.1 g/dL (ref 3.5–5.2)
Alkaline Phosphatase: 55 U/L (ref 39–117)
BILIRUBIN DIRECT: 0.1 mg/dL (ref 0.0–0.3)
Total Bilirubin: 0.5 mg/dL (ref 0.2–1.2)
Total Protein: 6.1 g/dL (ref 6.0–8.3)

## 2018-11-21 LAB — HEMOGLOBIN A1C: Hgb A1c MFr Bld: 7.6 % — ABNORMAL HIGH (ref 4.6–6.5)

## 2018-11-21 LAB — BASIC METABOLIC PANEL
BUN: 17 mg/dL (ref 6–23)
CALCIUM: 9.5 mg/dL (ref 8.4–10.5)
CO2: 29 meq/L (ref 19–32)
Chloride: 101 mEq/L (ref 96–112)
Creatinine, Ser: 0.99 mg/dL (ref 0.40–1.50)
GFR: 78.08 mL/min (ref >60.00)
Glucose, Bld: 140 mg/dL — ABNORMAL HIGH (ref 70–99)
Potassium: 4.2 mEq/L (ref 3.5–5.1)
SODIUM: 140 meq/L (ref 135–145)

## 2018-11-21 LAB — PSA: PSA: 2.24 ng/mL (ref 0.10–4.00)

## 2018-11-21 NOTE — Progress Notes (Signed)
   Subjective:    Patient ID: Walter Campbell, male    DOB: 11-21-1940, 78 y.o.   MRN: [id number]  HPI Here to follow up on issues. He feels well. His BP is stable. His am fasting glucoses run in the 130s and 140s.    Review of Systems  Constitutional: Negative.   HENT: Negative.   Eyes: Negative.   Respiratory: Negative.   Cardiovascular: Negative.   Gastrointestinal: Negative.   Genitourinary: Negative.   Musculoskeletal: Negative.   Skin: Negative.   Neurological: Negative.   Psychiatric/Behavioral: Negative.        Objective:   Physical Exam Constitutional:      General: He is not in acute distress.    Appearance: He is well-developed. He is not diaphoretic.  HENT:     Head: Normocephalic and atraumatic.     Right Ear: External ear normal.     Left Ear: External ear normal.     Nose: Nose normal.     Mouth/Throat:     Pharynx: No oropharyngeal exudate.  Eyes:     General: No scleral icterus.       Right eye: No discharge.        Left eye: No discharge.     Conjunctiva/sclera: Conjunctivae normal.     Pupils: Pupils are equal, round, and reactive to light.  Neck:     Musculoskeletal: Neck supple.     Thyroid: No thyromegaly.     Vascular: No JVD.     Trachea: No tracheal deviation.  Cardiovascular:     Rate and Rhythm: Normal rate and regular rhythm.     Heart sounds: Normal heart sounds. No murmur. No friction rub. No gallop.   Pulmonary:     Effort: Pulmonary effort is normal. No respiratory distress.     Breath sounds: Normal breath sounds. No wheezing or rales.  Chest:     Chest wall: No tenderness.  Abdominal:     General: Bowel sounds are normal. There is no distension.     Palpations: Abdomen is soft. There is no mass.     Tenderness: There is no abdominal tenderness. There is no guarding or rebound.  Genitourinary:    Penis: Normal. No tenderness.      Prostate: Normal.     Rectum: Normal. Guaiac result negative.  Musculoskeletal: Normal range  of motion.        General: No tenderness.  Lymphadenopathy:     Cervical: No cervical adenopathy.  Skin:    General: Skin is warm and dry.     Coloration: Skin is not pale.     Findings: No erythema or rash.  Neurological:     Mental Status: He is alert and oriented to person, place, and time.     Cranial Nerves: No cranial nerve deficit.     Motor: No abnormal muscle tone.     Coordination: Coordination normal.     Deep Tendon Reflexes: Reflexes are normal and symmetric. Reflexes normal.  Psychiatric:        Behavior: Behavior normal.        Thought Content: Thought content normal.        Judgment: Judgment normal.           Assessment & Plan:  His HTN is stable. For the diabetes we will get fasting labs including an A1c. Check the lipids. He is set for another colonoscopy with Dr. Cristina Gong in a few weeks. Marland Kitchen  Alysia Penna, MD

## 2018-11-23 ENCOUNTER — Encounter: Payer: Self-pay | Admitting: *Deleted

## 2018-12-19 ENCOUNTER — Encounter: Payer: Self-pay | Admitting: Family Medicine

## 2018-12-19 HISTORY — PX: COLONOSCOPY: SHX174

## 2019-01-29 ENCOUNTER — Other Ambulatory Visit: Payer: Self-pay | Admitting: Family Medicine

## 2019-02-02 ENCOUNTER — Other Ambulatory Visit: Payer: Self-pay | Admitting: Family Medicine

## 2019-02-02 DIAGNOSIS — I10 Essential (primary) hypertension: Secondary | ICD-10-CM

## 2019-02-24 ENCOUNTER — Other Ambulatory Visit (INDEPENDENT_AMBULATORY_CARE_PROVIDER_SITE_OTHER): Payer: Medicare Other

## 2019-02-24 ENCOUNTER — Other Ambulatory Visit: Payer: Self-pay

## 2019-02-24 DIAGNOSIS — E119 Type 2 diabetes mellitus without complications: Secondary | ICD-10-CM | POA: Diagnosis not present

## 2019-02-24 DIAGNOSIS — E785 Hyperlipidemia, unspecified: Secondary | ICD-10-CM

## 2019-02-24 LAB — HEPATIC FUNCTION PANEL
ALT: 15 U/L (ref 0–53)
AST: 12 U/L (ref 0–37)
Albumin: 4 g/dL (ref 3.5–5.2)
Alkaline Phosphatase: 53 U/L (ref 39–117)
Bilirubin, Direct: 0.1 mg/dL (ref 0.0–0.3)
Total Bilirubin: 0.4 mg/dL (ref 0.2–1.2)
Total Protein: 6 g/dL (ref 6.0–8.3)

## 2019-02-24 LAB — LIPID PANEL
Cholesterol: 133 mg/dL (ref 0–200)
HDL: 45.9 mg/dL (ref >39.00)
LDL Cholesterol: 66 mg/dL (ref 0–99)
NonHDL: 87
Total CHOL/HDL Ratio: 3
Triglycerides: 105 mg/dL (ref 0.0–149.0)
VLDL: 21 mg/dL (ref 0.0–40.0)

## 2019-02-24 LAB — HEMOGLOBIN A1C: Hgb A1c MFr Bld: 7.5 % — ABNORMAL HIGH (ref 4.6–6.5)

## 2019-02-28 ENCOUNTER — Other Ambulatory Visit: Payer: Self-pay | Admitting: *Deleted

## 2019-02-28 DIAGNOSIS — E119 Type 2 diabetes mellitus without complications: Secondary | ICD-10-CM

## 2019-02-28 MED ORDER — GLIPIZIDE 5 MG PO TABS: 5.0000 mg | ORAL_TABLET | Freq: Two times a day (BID) | ORAL | 5 refills | Status: DC

## 2019-04-11 ENCOUNTER — Other Ambulatory Visit: Payer: Self-pay | Admitting: Family Medicine

## 2019-04-25 NOTE — Progress Notes (Signed)
[facility] [address] [place], [place] [address] Phone: [phone] Subjective:   Walter Campbell, am serving as a scribe for Dr. Hulan Saas.  I'm seeing this patient by the request  of:  Laurey Morale, MD   CC: Low back pain  DGL:[id number]  Walter Campbell is a 78 y.o. male coming in with complaint of hip  Pain for one month. Patient states that when he drives for 18-84 minutes he develops pain that goes down the the back of right leg to his calf. Has a 58 year old father who lives in [place] who he needs to go and visit. Does not notice pain with movement. Does do Pilates.  Patient feels he does need to go on a long drive because of the same secondary to leg pain.   Patient did have an MRI of the lumbar spine 2007.  Independently visualized by me.  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  Otherwise fairly unremarkable  Past Medical History:  Diagnosis Date  . Diabetes mellitus   . Hypertension   . Palpitations 06/10/2010   Past Surgical History:  Procedure Laterality Date  . APPENDECTOMY  1957  . [place], 2015, 2016   Torn meniscus L scope 2016, R 2015, L 1991 scope  . 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 Marital status: Married    Spouse name: Not on file  . Number of children: Not on file  . Years of education: Not on file  . Highest education level: Not on file  Occupational History  . Not on file  Social Needs  . Financial resource strain: Not on file  . Food insecurity    Worry: Not on file    Inability: Not on file  . Transportation needs    Medical: Not on file    Non-medical: Not on file  Tobacco Use  . Smoking status: Never Smoker  . Smokeless tobacco: Never Used  Substance and Sexual Activity  . Alcohol use: Yes    Alcohol/week: 1.0 - 7.0 standard drinks    Types: 1 - 7 Standard drinks or  equivalent per week  . Drug use: Campbell  . Sexual activity: Not on file  Lifestyle  . Physical activity    Days per week: Not on file    Minutes per session: Not on file  . Stress: Not on file  Relationships  . Social Herbalist on phone: Not on file    Gets together: Not on file    Attends religious service: Not on file    Active member of club or organization: Not on file    Attends meetings of clubs or organizations: Not on file    Relationship status: Not on file  Other Topics Concern  . Not on file  Social History Narrative   Married. 1 daughter, 2 step sons. 8 grandkids. Campbell greatgrandkids.       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 Stroke Mother   . Diabetes Mother   . Osteoporosis Mother   . Dementia Mother   . Heart disease Mother        pacemaker  . Hyperlipidemia Father  diet controlled  . CAD Father        stents in 24s or 50s  . Arthritis Sister   . Hyperthyroidism Sister     Current Outpatient Medications (Endocrine & Metabolic):  .  glipiZIDE (GLUCOTROL) 5 MG tablet, Take 1 tablet (5 mg total) by mouth 2 (two) times daily before a meal. .  metFORMIN (GLUCOPHAGE-XR) 500 MG 24 hr tablet, TAKE 2 TABLETS BY MOUTH TWICE A DAY WITH A MEAL.  Current Outpatient Medications (Cardiovascular):  .  atorvastatin (LIPITOR) 20 MG tablet, Twice a week (Mon, fridays) .  benazepril-hydrochlorthiazide (LOTENSIN HCT) 20-25 MG tablet, TAKE 1 TABLET BY MOUTH EVERY DAY .  felodipine (PLENDIL) 5 MG 24 hr tablet, TAKE 1 TABLET BY MOUTH EVERY DAY     Current Outpatient Medications (Other):  .  gabapentin (NEURONTIN) 100 MG capsule, Take 2 capsules (200 mg total) by mouth at bedtime. Marland Kitchen  glucose blood (ONE TOUCH ULTRA TEST) test strip, CHECK BLOOD SUGAR EVERY DAY    Past medical history, social, surgical and family history all  reviewed in electronic medical record.  Campbell pertanent information unless stated regarding to the chief complaint.   Review of Systems:  Campbell headache, visual changes, nausea, vomiting, diarrhea, constipation, dizziness, abdominal pain, skin rash, fevers, chills, night sweats, weight loss, swollen lymph nodes, , chest pain, shortness of breath, mood changes.  Positive muscle aches and body aches  Objective  Blood pressure (!) 102/50, pulse 68, height 5' 8.5" (1.74 m), weight 210 lb (95.3 kg), SpO2 98 %.    General: Campbell apparent distress alert and oriented x3 mood and affect normal, dressed appropriately.  HEENT: Pupils equal, extraocular movements intact  Respiratory: Patient's speak in full sentences and does not appear short of breath  Cardiovascular: Campbell lower extremity edema, non tender, Campbell erythema  Skin: Warm dry intact with Campbell signs of infection or rash on extremities or on axial skeleton.  Abdomen: Soft nontender  Neuro: Cranial nerves II through XII are intact, neurovascularly intact in all extremities with 2+ DTRs and 2+ pulses.  Lymph: Campbell lymphadenopathy of posterior or anterior cervical chain or axillae bilaterally.  Gait normal with good balance and coordination.  MSK:  Non tender with full range of motion and good stability and symmetric strength and tone of shoulders, elbows, wrist, hip, knee and ankles bilaterally.  Mild arthritic changes of multiple  Low back pain shows the patient does have some tenderness to palpation the paraspinal musculature but more over the right piriformis.  Negative straight leg test.  Positive [place].  Neurovascular intact distally 5-5 strength  [id number]; 15 additional minutes spent for Therapeutic exercises as stated in above notes.  This included exercises focusing on stretching, strengthening, with significant focus on eccentric aspects.   Long term goals include an improvement in range of motion, strength, endurance as well as avoiding reinjury. Patient's  frequency would include in 1-2 times a day, 3-5 times a week for a duration of 6-12 weeks. Low back exercises that included:  Pelvic tilt/bracing instruction to focus on control of the pelvic girdle and lower abdominal muscles  Glute strengthening exercises, focusing on proper firing of the glutes without engaging the low back muscles Proper stretching techniques for maximum relief for the hamstrings, hip flexors, low back and some rotation where tolerated   Proper technique shown and discussed handout in great detail with ATC.  All questions were discussed and answered.      Impression and Recommendations:     This  case required medical decision making of moderate complexity. The above documentation has been reviewed and is accurate and complete Walter Pulley, DO       Note: This dictation was prepared with Dragon dictation along with smaller phrase technology. Any transcriptional errors that result from this process are unintentional.

## 2019-04-26 ENCOUNTER — Ambulatory Visit (INDEPENDENT_AMBULATORY_CARE_PROVIDER_SITE_OTHER)
Admission: RE | Admit: 2019-04-26 | Discharge: 2019-04-26 | Disposition: A | Discharge status: Home / Self Care | Payer: Medicare Other | Source: Ambulatory Visit | Attending: Family Medicine | Admitting: Family Medicine

## 2019-04-26 ENCOUNTER — Encounter: Payer: Self-pay | Admitting: Family Medicine

## 2019-04-26 ENCOUNTER — Other Ambulatory Visit: Payer: Self-pay

## 2019-04-26 ENCOUNTER — Ambulatory Visit (INDEPENDENT_AMBULATORY_CARE_PROVIDER_SITE_OTHER): Payer: Medicare Other | Admitting: Family Medicine

## 2019-04-26 VITALS — BP 102/50 | HR 68 | Ht 68.5 in | Wt 210.0 lb

## 2019-04-26 DIAGNOSIS — G8929 Other chronic pain: Secondary | ICD-10-CM

## 2019-04-26 DIAGNOSIS — G5701 Lesion of sciatic nerve, right lower limb: Secondary | ICD-10-CM | POA: Insufficient documentation

## 2019-04-26 DIAGNOSIS — M545 Low back pain, unspecified: Secondary | ICD-10-CM

## 2019-04-26 MED ORDER — GABAPENTIN 100 MG PO CAPS: 200.0000 mg | ORAL_CAPSULE | Freq: Every day | ORAL | 3 refills | Status: DC

## 2019-04-26 NOTE — Assessment & Plan Note (Signed)
Using Netter's Orthopaedic Anatomy, reviewed with the patient the structures involved and how they related to diagnosis. The patient indicated understanding.   The patient was given a handout about classic piriformis stretching including [employer], Modified [employer], my self-described "Sink Stretch," and other piriformis rehab.  We also reviewed hip flexor and abductor strengthening, ham stretching  Rec deep massage, explained self-massage with ball RTC in 4 weeks possible injections

## 2019-04-26 NOTE — Patient Instructions (Addendum)
Good to see you  Ice 20 minutes 2 times daily. Usually after activity and before bed. Exercises 3 times a week.  Tennis ball in back right pocket with sitting  Gabapentin 200mg  at night Vitamin D 2000 Iu daily  Keep doing the pilates  Xrays downstairs  See me again in 3-4 weeks

## 2019-05-20 ENCOUNTER — Other Ambulatory Visit: Payer: Self-pay | Admitting: Family Medicine

## 2019-05-30 ENCOUNTER — Other Ambulatory Visit (INDEPENDENT_AMBULATORY_CARE_PROVIDER_SITE_OTHER): Payer: Medicare Other

## 2019-05-30 ENCOUNTER — Other Ambulatory Visit: Payer: Self-pay

## 2019-05-30 DIAGNOSIS — E119 Type 2 diabetes mellitus without complications: Secondary | ICD-10-CM

## 2019-05-30 LAB — HEMOGLOBIN A1C: Hgb A1c MFr Bld: 6.7 % — ABNORMAL HIGH (ref 4.6–6.5)

## 2019-07-25 ENCOUNTER — Other Ambulatory Visit: Payer: Self-pay | Admitting: Family Medicine

## 2019-07-25 DIAGNOSIS — I10 Essential (primary) hypertension: Secondary | ICD-10-CM

## 2019-07-28 ENCOUNTER — Other Ambulatory Visit: Payer: Self-pay

## 2019-07-28 ENCOUNTER — Ambulatory Visit: Payer: Medicare Other

## 2019-07-28 ENCOUNTER — Ambulatory Visit (INDEPENDENT_AMBULATORY_CARE_PROVIDER_SITE_OTHER): Payer: Medicare Other

## 2019-07-28 DIAGNOSIS — Z23 Encounter for immunization: Secondary | ICD-10-CM | POA: Diagnosis not present

## 2019-08-15 ENCOUNTER — Other Ambulatory Visit: Payer: Self-pay | Admitting: Family Medicine

## 2019-08-15 DIAGNOSIS — E119 Type 2 diabetes mellitus without complications: Secondary | ICD-10-CM

## 2019-10-11 ENCOUNTER — Other Ambulatory Visit: Payer: Self-pay | Admitting: Family Medicine

## 2019-10-11 NOTE — Telephone Encounter (Signed)
Last OV 11/21/18 Last refill - NEW REQUEST Next OV 11/27/19

## 2019-10-28 ENCOUNTER — Other Ambulatory Visit: Payer: Self-pay | Admitting: Family Medicine

## 2019-11-27 ENCOUNTER — Other Ambulatory Visit: Payer: Self-pay

## 2019-11-27 ENCOUNTER — Encounter: Payer: Self-pay | Admitting: Family Medicine

## 2019-11-27 ENCOUNTER — Ambulatory Visit (INDEPENDENT_AMBULATORY_CARE_PROVIDER_SITE_OTHER): Payer: Medicare PPO | Admitting: Family Medicine

## 2019-11-27 VITALS — BP 110/60 | HR 77 | Temp 97.6°F | Wt 206.2 lb

## 2019-11-27 DIAGNOSIS — E119 Type 2 diabetes mellitus without complications: Secondary | ICD-10-CM

## 2019-11-27 DIAGNOSIS — Z0000 Encounter for general adult medical examination without abnormal findings: Secondary | ICD-10-CM

## 2019-11-27 LAB — LIPID PANEL
Cholesterol: 140 mg/dL (ref 0–200)
HDL: 50.4 mg/dL (ref >39.00)
LDL Cholesterol: 66 mg/dL (ref 0–99)
NonHDL: 90.02
Total CHOL/HDL Ratio: 3
Triglycerides: 121 mg/dL (ref 0.0–149.0)
VLDL: 24.2 mg/dL (ref 0.0–40.0)

## 2019-11-27 LAB — CBC WITH DIFFERENTIAL/PLATELET
Basophils Absolute: 0.1 10*3/uL (ref 0.0–0.1)
Basophils Relative: 1 % (ref 0.0–3.0)
Eosinophils Absolute: 0.6 10*3/uL (ref 0.0–0.7)
Eosinophils Relative: 7.4 % — ABNORMAL HIGH (ref 0.0–5.0)
HCT: 41.9 % (ref 39.0–52.0)
Hemoglobin: 13.8 g/dL (ref 13.0–17.0)
Lymphocytes Relative: 21.9 % (ref 12.0–46.0)
Lymphs Abs: 1.7 10*3/uL (ref 0.7–4.0)
MCHC: 32.9 g/dL (ref 30.0–36.0)
MCV: 89.9 fl (ref 78.0–100.0)
Monocytes Absolute: 0.5 10*3/uL (ref 0.1–1.0)
Monocytes Relative: 5.9 % (ref 3.0–12.0)
Neutro Abs: 5.1 10*3/uL (ref 1.4–7.7)
Neutrophils Relative %: 63.8 % (ref 43.0–77.0)
Platelets: 270 10*3/uL (ref 150.0–400.0)
RBC: 4.65 Mil/uL (ref 4.22–5.81)
RDW: 13.7 % (ref 11.5–15.5)
WBC: 7.9 10*3/uL (ref 4.0–10.5)

## 2019-11-27 LAB — PSA: PSA: 3.36 ng/mL (ref 0.10–4.00)

## 2019-11-27 LAB — TSH: TSH: 2.61 u[IU]/mL (ref 0.35–4.50)

## 2019-11-27 LAB — BASIC METABOLIC PANEL
BUN: 19 mg/dL (ref 6–23)
CO2: 27 mEq/L (ref 19–32)
Calcium: 9.3 mg/dL (ref 8.4–10.5)
Chloride: 101 mEq/L (ref 96–112)
Creatinine, Ser: 0.94 mg/dL (ref 0.40–1.50)
GFR: 77.78 mL/min (ref >60.00)
Glucose, Bld: 143 mg/dL — ABNORMAL HIGH (ref 70–99)
Potassium: 4.1 mEq/L (ref 3.5–5.1)
Sodium: 138 mEq/L (ref 135–145)

## 2019-11-27 LAB — HEPATIC FUNCTION PANEL
ALT: 14 U/L (ref 0–53)
AST: 14 U/L (ref 0–37)
Albumin: 4.2 g/dL (ref 3.5–5.2)
Alkaline Phosphatase: 58 U/L (ref 39–117)
Bilirubin, Direct: 0.1 mg/dL (ref 0.0–0.3)
Total Bilirubin: 0.5 mg/dL (ref 0.2–1.2)
Total Protein: 6.3 g/dL (ref 6.0–8.3)

## 2019-11-27 LAB — HEMOGLOBIN A1C: Hgb A1c MFr Bld: 6.6 % — ABNORMAL HIGH (ref 4.6–6.5)

## 2019-11-27 NOTE — Progress Notes (Signed)
   Subjective:    Patient ID: Walter Campbell, male    DOB: Nov 10, 1940, 79 y.o.   MRN: [id number]  HPI Here for a well exam. He feels well. His insurance company has approved for him to wear a continuous glucose monitor, and his last 14 day average has been 120.    Review of Systems  Constitutional: Negative.   HENT: Negative.   Eyes: Negative.   Respiratory: Negative.   Cardiovascular: Negative.   Gastrointestinal: Negative.   Genitourinary: Negative.   Musculoskeletal: Negative.   Skin: Negative.   Neurological: Negative.   Psychiatric/Behavioral: Negative.        Objective:   Physical Exam Constitutional:      General: He is not in acute distress.    Appearance: He is well-developed. He is not diaphoretic.  HENT:     Head: Normocephalic and atraumatic.     Right Ear: External ear normal.     Left Ear: External ear normal.     Nose: Nose normal.     Mouth/Throat:     Pharynx: No oropharyngeal exudate.  Eyes:     General: No scleral icterus.       Right eye: No discharge.        Left eye: No discharge.     Conjunctiva/sclera: Conjunctivae normal.     Pupils: Pupils are equal, round, and reactive to light.  Neck:     Thyroid: No thyromegaly.     Vascular: No JVD.     Trachea: No tracheal deviation.  Cardiovascular:     Rate and Rhythm: Normal rate and regular rhythm.     Heart sounds: Normal heart sounds. No murmur. No friction rub. No gallop.   Pulmonary:     Effort: Pulmonary effort is normal. No respiratory distress.     Breath sounds: Normal breath sounds. No wheezing or rales.  Chest:     Chest wall: No tenderness.  Abdominal:     General: Bowel sounds are normal. There is no distension.     Palpations: Abdomen is soft. There is no mass.     Tenderness: There is no abdominal tenderness. There is no guarding or rebound.  Genitourinary:    Penis: Normal. No tenderness.      Testes: Normal.     Prostate: Normal.     Rectum: Normal. Guaiac result  negative.  Musculoskeletal:        General: No tenderness. Normal range of motion.     Cervical back: Neck supple.  Lymphadenopathy:     Cervical: No cervical adenopathy.  Skin:    General: Skin is warm and dry.     Coloration: Skin is not pale.     Findings: No erythema or rash.  Neurological:     Mental Status: He is alert and oriented to person, place, and time.     Cranial Nerves: No cranial nerve deficit.     Motor: No abnormal muscle tone.     Coordination: Coordination normal.     Deep Tendon Reflexes: Reflexes are normal and symmetric. Reflexes normal.  Psychiatric:        Behavior: Behavior normal.        Thought Content: Thought content normal.        Judgment: Judgment normal.           Assessment & Plan:  Well exam. We discussed diet and exercise. Get fasting labs including an A1c.  Alysia Penna, MD

## 2019-12-05 ENCOUNTER — Ambulatory Visit: Payer: Medicare PPO

## 2019-12-07 ENCOUNTER — Telehealth: Payer: Self-pay | Admitting: Family Medicine

## 2019-12-07 NOTE — Telephone Encounter (Signed)
Sharee Pimple with [facility] is requesting pt's most recent clinical notes. Per Sharee Pimple, she faxed over clinical notes on Monday. Sharee Pimple would like to speak to you first. Thanks  Jill's # [phone].

## 2019-12-08 NOTE — Telephone Encounter (Signed)
Attempted to return Jill's call. Unable to leave a voicemail.  What is this information needed for? Is it office notes that are needed?

## 2019-12-11 NOTE — Telephone Encounter (Signed)
ATC 

## 2019-12-14 ENCOUNTER — Ambulatory Visit: Payer: Medicare PPO | Attending: Internal Medicine

## 2019-12-14 DIAGNOSIS — Z23 Encounter for immunization: Secondary | ICD-10-CM

## 2019-12-14 NOTE — Progress Notes (Signed)
   Covid-19 Vaccination Clinic  Name:  Walter Campbell    MRN: [id number] DOB: 03/05/1941  12/14/2019  [name] was observed post Covid-19 immunization for 15 minutes without incidence. He was provided with Vaccine Information Sheet and instruction to access the V-Safe system.   [name] was instructed to call 911 with any severe reactions post vaccine: Marland Kitchen Difficulty breathing  . Swelling of your face and throat  . A fast heartbeat  . A bad rash all over your body  . Dizziness and weakness    Immunizations Administered    Name Date Dose VIS Date Route   Pfizer COVID-19 Vaccine 12/14/2019 10:42 AM 0.3 mL 10/20/2019 Intramuscular   Manufacturer: [place]   Lot: [id number]   [place]: [id number]

## 2019-12-15 ENCOUNTER — Telehealth: Payer: Self-pay | Admitting: *Deleted

## 2019-12-15 NOTE — Telephone Encounter (Signed)
ATC 

## 2019-12-15 NOTE — Telephone Encounter (Signed)
Sarah with [facility] called and reports they need the most recent clinical notes. Judson Roch can be reached at [phone]. Notes can be faxed to [phone]

## 2019-12-18 NOTE — Telephone Encounter (Signed)
Unable to reach the patient. Message with be closed.

## 2019-12-25 ENCOUNTER — Telehealth: Payer: Self-pay | Admitting: Family Medicine

## 2019-12-25 NOTE — Telephone Encounter (Signed)
Tamesha with [facility] is requesting the most recent clinical notes. Per Sheran Lawless, this was requested on 12/15/19. Sheran Lawless can be reached at [phone] and fax [phone]. Thanks

## 2019-12-29 DIAGNOSIS — H5203 Hypermetropia, bilateral: Secondary | ICD-10-CM | POA: Diagnosis not present

## 2019-12-29 DIAGNOSIS — E119 Type 2 diabetes mellitus without complications: Secondary | ICD-10-CM | POA: Diagnosis not present

## 2019-12-29 DIAGNOSIS — H2513 Age-related nuclear cataract, bilateral: Secondary | ICD-10-CM | POA: Diagnosis not present

## 2019-12-29 LAB — HM DIABETES EYE EXAM

## 2020-01-08 ENCOUNTER — Ambulatory Visit: Payer: Medicare PPO | Attending: Internal Medicine

## 2020-01-08 DIAGNOSIS — Z23 Encounter for immunization: Secondary | ICD-10-CM | POA: Insufficient documentation

## 2020-01-08 NOTE — Progress Notes (Signed)
   Covid-19 Vaccination Clinic  Name:  SAATVIK CLINKSCALES    MRN: [id number] DOB: [birth year]  01/08/2020  Mr. Reznick was observed post Covid-19 immunization for 15 minutes without incidence. He was provided with Vaccine Information Sheet and instruction to access the V-Safe system.   Mr. Evelyn was instructed to call 911 with any severe reactions post vaccine: Marland Kitchen Difficulty breathing  . Swelling of your face and throat  . A fast heartbeat  . A bad rash all over your body  . Dizziness and weakness    Immunizations Administered    Name Date Dose VIS Date Route   Pfizer COVID-19 Vaccine 01/08/2020  2:53 PM 0.3 mL 10/20/2019 Intramuscular   Manufacturer: [place]   Lot: [id number]   [place]: [id number]

## 2020-01-16 ENCOUNTER — Other Ambulatory Visit: Payer: Self-pay

## 2020-01-17 ENCOUNTER — Ambulatory Visit: Payer: Medicare PPO | Admitting: Family Medicine

## 2020-01-17 ENCOUNTER — Encounter: Payer: Self-pay | Admitting: Family Medicine

## 2020-01-17 VITALS — BP 140/58 | HR 78 | Temp 97.9°F | Wt 210.2 lb

## 2020-01-19 NOTE — Progress Notes (Signed)
cancelled

## 2020-02-06 ENCOUNTER — Other Ambulatory Visit: Payer: Self-pay | Admitting: Family Medicine

## 2020-02-06 DIAGNOSIS — I10 Essential (primary) hypertension: Secondary | ICD-10-CM

## 2020-02-07 ENCOUNTER — Other Ambulatory Visit: Payer: Self-pay | Admitting: Family Medicine

## 2020-02-13 ENCOUNTER — Other Ambulatory Visit: Payer: Self-pay | Admitting: Family Medicine

## 2020-02-13 DIAGNOSIS — E119 Type 2 diabetes mellitus without complications: Secondary | ICD-10-CM

## 2020-05-17 ENCOUNTER — Ambulatory Visit: Payer: Medicare PPO | Admitting: Family Medicine

## 2020-05-22 ENCOUNTER — Encounter: Payer: Self-pay | Admitting: Family Medicine

## 2020-05-22 ENCOUNTER — Other Ambulatory Visit: Payer: Self-pay

## 2020-05-22 ENCOUNTER — Ambulatory Visit: Payer: Medicare PPO | Admitting: Family Medicine

## 2020-05-22 VITALS — BP 110/60 | HR 75 | Temp 97.9°F | Wt 208.2 lb

## 2020-05-22 DIAGNOSIS — E119 Type 2 diabetes mellitus without complications: Secondary | ICD-10-CM

## 2020-05-22 DIAGNOSIS — I10 Essential (primary) hypertension: Secondary | ICD-10-CM | POA: Diagnosis not present

## 2020-05-22 LAB — POCT GLYCOSYLATED HEMOGLOBIN (HGB A1C): Hemoglobin A1C: 6.7 % — AB (ref 4.0–5.6)

## 2020-05-22 NOTE — Progress Notes (Signed)
   Subjective:    Patient ID: Walter Campbell, male    DOB: 09-Apr-1941, 79 y.o.   MRN: [id number]  HPI Here to follow up. He feels well. His BP is stable. His average glucose per his CGM has been 124 for the past 2 weeks. He was actually having a lot of hypoglycemic spells where his glucose would drop to 45 or 50, so he has cut the Glipizide dosing to only once a day in the mornings. He still has to be very careful to eat within a certain time frame or the glucose will still drop. His A1c today is 6.7.   Review of Systems  Constitutional: Negative.   Respiratory: Negative.   Cardiovascular: Negative.        Objective:   Physical Exam Constitutional:      Appearance: Normal appearance.  Cardiovascular:     Rate and Rhythm: Normal rate and regular rhythm.     Pulses: Normal pulses.     Heart sounds: Normal heart sounds.  Pulmonary:     Effort: Pulmonary effort is normal.     Breath sounds: Normal breath sounds.  Neurological:     Mental Status: He is alert.           Assessment & Plan:  His HTN is stable. His diabetes is well controlled and we agreed to stop taking Glipizide altogether. Recheck in 6 months. Alysia Penna, MD

## 2020-06-06 ENCOUNTER — Other Ambulatory Visit: Payer: Self-pay | Admitting: Family Medicine

## 2020-06-29 ENCOUNTER — Other Ambulatory Visit: Payer: Self-pay | Admitting: Family Medicine

## 2020-07-17 ENCOUNTER — Ambulatory Visit (INDEPENDENT_AMBULATORY_CARE_PROVIDER_SITE_OTHER): Payer: Medicare PPO

## 2020-07-17 ENCOUNTER — Other Ambulatory Visit: Payer: Self-pay

## 2020-07-17 DIAGNOSIS — Z0000 Encounter for general adult medical examination without abnormal findings: Secondary | ICD-10-CM

## 2020-07-17 NOTE — Progress Notes (Signed)
Subjective:   Walter Campbell is a 79 y.o. male who presents for Medicare Annual/Subsequent preventive examination.  I connected with Walter Campbell today by telephone and verified that I am speaking with the correct person using two identifiers. Location patient: home Location provider: work Persons participating in the virtual visit: patient, provider.   I discussed the limitations, risks, security and privacy concerns of performing an evaluation and management service by telephone and the availability of in person appointments. I also discussed with the patient that there may be a patient responsible charge related to this service. The patient expressed understanding and verbally consented to this telephonic visit.    Interactive audio and video telecommunications were attempted between this provider and patient, however failed, due to patient having technical difficulties OR patient did not have access to video capability.  We continued and completed visit with audio only.      Review of Systems    N/A       Objective:    Today's Vitals   There is no height or weight on file to calculate BMI.  Advanced Directives 07/17/2020 04/29/2017 02/06/2015 12/26/2014  Does Patient Have a Medical Advance Directive? Yes Yes No Yes  Type of Paramedic of [place];Living will [place];Living will - Living will  Does patient want to make changes to medical advance directive? No - Patient declined - - No - Patient declined  Copy of [place] in Chart? No - copy requested No - copy requested - No - copy requested    Current Medications (verified) Outpatient Encounter Medications as of 07/17/2020  Medication Sig  . atorvastatin (LIPITOR) 20 MG tablet Twice a week (Mon, fridays)  . benazepril-hydrochlorthiazide (LOTENSIN HCT) 20-25 MG tablet TAKE 1 TABLET BY MOUTH EVERY DAY  . Continuous Blood Gluc Sensor (FREESTYLE LIBRE 14 DAY  SENSOR) MISC USE AS DIRECTED  . felodipine (PLENDIL) 5 MG 24 hr tablet TAKE 1 TABLET BY MOUTH EVERY DAY  . metFORMIN (GLUCOPHAGE-XR) 500 MG 24 hr tablet TAKE 2 TABLETS BY MOUTH TWICE A DAY WITH A MEAL.  . [DISCONTINUED] gabapentin (NEURONTIN) 100 MG capsule TAKE 2 CAPSULES AT BEDTIME   No facility-administered encounter medications on file as of 07/17/2020.    Allergies (verified) Patient has no known allergies.   History: Past Medical History:  Diagnosis Date  . Diabetes mellitus   . Hypertension   . Palpitations 06/10/2010   Past Surgical History:  Procedure Laterality Date  . APPENDECTOMY  1957  . COLONOSCOPY  12/19/2018   per Dr. Cristina Gong, adenmatous polyps, repeat in 3 years   . [place], 2015, 2016   Torn meniscus L scope 2016, R 2015, L 1991 scope  . 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 Stroke Mother   . Diabetes Mother   . Osteoporosis Mother   . Dementia Mother   . Heart disease Mother        pacemaker  . Hyperlipidemia Father        diet controlled  . CAD Father        stents in 4s or 93s  . Arthritis Sister   . Hyperthyroidism Sister    Social History   Socioeconomic History  . Marital status: Married    Spouse name: Not on file  . Number of children: Not on file  . Years of education:  Not on file  . Highest education level: Not on file  Occupational History  . Not on file  Tobacco Use  . Smoking status: Never Smoker  . Smokeless tobacco: Never Used  Substance and Sexual Activity  . Alcohol use: Yes    Alcohol/week: 1.0 - 7.0 standard drink    Types: 1 - 7 Standard drinks or equivalent per week  . Drug use: No  . Sexual activity: Not on file  Other Topics Concern  . Not on file  Social History Narrative   Married. 1 daughter, 2 step sons. 8 grandkids. No greatgrandkids.       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 Difficulty of Paying Living Expenses: Not hard at all  Food Insecurity: No Food Insecurity  . Worried About Charity fundraiser in the Last Year: Never true  . Ran Out of Food in the Last Year: Never true  Transportation Needs: No Transportation Needs  . Lack of Transportation (Medical): No  . Lack of Transportation (Non-Medical): No  Physical Activity: Insufficiently Active  . Days of Exercise per Week: 3 days  . Minutes of Exercise per Session: 40 min  Stress: No Stress Concern Present  . Feeling of Stress : Not at all  Social Connections: Moderately Isolated  . Frequency of Communication with Friends and Family: More than three times a week  . Frequency of Social Gatherings with Friends and Family: Once a week  . Attends Religious Services: Never  . Active Member of Clubs or Organizations: No  . Attends Archivist Meetings: Never  . Marital Status: Married    Tobacco Counseling Counseling given: Not Answered   Clinical Intake:  Pre-visit preparation completed: Yes  Pain : No/denies pain     Nutritional Risks: None Diabetes: Yes (Patient states checks his glucose 4-5 times per day) CBG done?: No (States average for the past 7 days was 131) Did pt. bring in CBG monitor from home?: No  How often do you need to have someone help you when you read instructions, pamphlets, or other written materials from your doctor or pharmacy?: 1 - Never What is the last grade level you completed in school?: Doctorate  Diabetic?Yes  Interpreter Needed?: No  Information entered by :: [place] of Daily Living In your present state of health, do you have any difficulty performing the following activities: 07/17/2020  Hearing? N  Vision? N  Difficulty concentrating or making decisions? N  Walking or climbing stairs? N  Dressing or bathing? N  Doing errands, shopping? N    Preparing Food and eating ? N  Using the Toilet? N  In the past six months, have you accidently leaked urine? Y  Comment Has occassional bladder leakage with urgency  Do you have problems with loss of bowel control? N  Managing your Medications? N  Managing your Finances? N  Housekeeping or managing your Housekeeping? N  Some recent data might be hidden    Patient Care Team: Laurey Morale, MD as PCP - General (Family Medicine) Rolm Bookbinder, MD as Consulting Physician (Dermatology) Marchia Bond, MD as Consulting Physician (Orthopedic Surgery)  Indicate any recent Medical Services you may have received from other than [facility] providers in the past year (date may be approximate).     Assessment:   This is a routine  wellness examination for [place].  Hearing/Vision screen  Hearing Screening   125Hz  250Hz  500Hz  1000Hz  2000Hz  3000Hz  4000Hz  6000Hz  8000Hz   Right ear:           Left ear:           Vision Screening Comments: Patient states gets eye examined yearly   Dietary issues and exercise activities discussed: Current Exercise Habits: Home exercise routine, Type of exercise: stretching;strength training/weights, Time (Minutes): 35, Frequency (Times/Week): 3, Weekly Exercise (Minutes/Week): 105  Goals    . Patient Stated     I will continue to do exercise 3-4 times a week for 30-40 minutes    . Weight (lb) < 200 lb (90.7 kg)     Lose weight by watching food intake which has worked in the past.      Depression Screen PHQ 2/9 Scores 07/17/2020 11/21/2018 04/29/2017 07/10/2016 05/02/2015 02/19/2014  PHQ - 2 Score 0 0 0 0 0 0  PHQ- 9 Score 0 - - - - -    Fall Risk Fall Risk  07/17/2020 11/21/2018 04/29/2017 07/10/2016 05/02/2015  Falls in the past year? 0 0 No Yes No  Number falls in past yr: 0 - - 1 -  Injury with Fall? 0 - - No -  Risk for fall due to : Medication side effect - - - -  Follow up Falls evaluation completed;Falls prevention discussed - - - -    Any stairs in or around  the home? Yes  If so, are there any without handrails? No  Home free of loose throw rugs in walkways, pet beds, electrical cords, etc? Yes  Adequate lighting in your home to reduce risk of falls? Yes   ASSISTIVE DEVICES UTILIZED TO PREVENT FALLS:  Life alert? Yes  Use of a cane, walker or w/c? No  Grab bars in the bathroom? Yes  Shower chair or bench in shower? Yes  Elevated toilet seat or a handicapped toilet? Yes    Cognitive Function:  Patient politely declined cognitive screening during today's visit      Immunizations Immunization History  Administered Date(s) Administered  . Fluad Quad(high Dose 65+) 07/28/2019  . Influenza Split 08/10/2011, 08/29/2012  . Influenza Whole 12/04/2009, 09/10/2010  . Influenza, High Dose Seasonal PF 07/10/2016, 08/20/2017, 09/12/2018  . Influenza,inj,Quad PF,6+ Mos 07/19/2013, 10/02/2014, 09/06/2015  . PFIZER SARS-COV-2 Vaccination 12/14/2019, 01/08/2020  . Pneumococcal Conjugate-13 11/27/2014  . Pneumococcal Polysaccharide-23 12/04/2009  . Td 11/09/2006  . Tdap 12/30/2016  . Zoster 10/15/2011    TDAP status: Up to date Flu Vaccine status: Up to date Pneumococcal vaccine status: Up to date Covid-19 vaccine status: Completed vaccines  Qualifies for Shingles Vaccine? Yes   Zostavax completed Yes   Shingrix Completed?: No.    Education has been provided regarding the importance of this vaccine. Patient has been advised to call insurance company to determine out of pocket expense if they have not yet received this vaccine. Advised may also receive vaccine at local pharmacy or Health Dept. Verbalized acceptance and understanding.  Screening Tests Health Maintenance  Topic Date Due  . Hepatitis C Screening  Never done  . FOOT EXAM  07/14/2018  . INFLUENZA VACCINE  06/09/2020  . HEMOGLOBIN A1C  11/22/2020  . OPHTHALMOLOGY EXAM  12/28/2020  . COLONOSCOPY  12/20/2023  . TETANUS/TDAP  12/30/2026  . COVID-19 Vaccine  Completed  . PNA vac  Low Risk Adult  Completed    Health Maintenance  Health Maintenance Due  Topic Date Due  .  Hepatitis C Screening  Never done  . FOOT EXAM  07/14/2018  . INFLUENZA VACCINE  06/09/2020    Colorectal cancer screening: Completed 12/19/2018. Repeat every 5 years  Lung Cancer Screening: (Low Dose CT Chest recommended if Age 93-80 years, 30 pack-year currently smoking OR have quit w/in 15years.) does not qualify.   Lung Cancer Screening Referral: N/A  Additional Screening:  Hepatitis C Screening: does not qualify;   Vision Screening: Recommended annual ophthalmology exams for early detection of glaucoma and other disorders of the eye. Is the patient up to date with their annual eye exam?  Yes  Who is the provider or what is the name of the office in which the patient attends annual eye exams? Dr. Schuyler Amor  If pt is not established with a provider, would they like to be referred to a provider to establish care? No .   Dental Screening: Recommended annual dental exams for proper oral hygiene  Community Resource Referral / Chronic Care Management: CRR required this visit?  No   CCM required this visit?  No      Plan:     I have personally reviewed and noted the following in the patient's chart:   . Medical and social history . Use of alcohol, tobacco or illicit drugs  . Current medications and supplements . Functional ability and status . Nutritional status . Physical activity . Advanced directives . List of other physicians . Hospitalizations, surgeries, and ER visits in previous 12 months . Vitals . Screenings to include cognitive, depression, and falls . Referrals and appointments  In addition, I have reviewed and discussed with patient certain preventive protocols, quality metrics, and best practice recommendations. A written personalized care plan for preventive services as well as general preventive health recommendations were provided to patient.     Ofilia Neas,  LPN   03/12/6502   Nurse Notes: None

## 2020-07-17 NOTE — Patient Instructions (Signed)
Walter Campbell , Thank you for taking time to come for your Medicare Wellness Visit. I appreciate your ongoing commitment to your health goals. Please review the following plan we discussed and let me know if I can assist you in the future.   Screening recommendations/referrals: Colonoscopy: Up to date, next due 12/20/2023 Recommended yearly ophthalmology/optometry visit for glaucoma screening and checkup Recommended yearly dental visit for hygiene and checkup  Vaccinations: Influenza vaccine: Currently due, please call our office when you are ready and get scheduled for your flu shot Pneumococcal vaccine: Completed series Tdap vaccine: Up to date, next due 12/30/2026 Shingles vaccine: Currently due, please contact your pharmacy to discuss cost of vaccine and to receive     Advanced directives: Please bring a copy of your medical advanced directives to your next in office appointment so that we may scan it into your chart.  Conditions/risks identified: None   Next appointment: None   Preventive Care 65 Years and Older, Male Preventive care refers to lifestyle choices and visits with your health care provider that can promote health and wellness. What does preventive care include?  A yearly physical exam. This is also called an annual well check.  Dental exams once or twice a year.  Routine eye exams. Ask your health care provider how often you should have your eyes checked.  Personal lifestyle choices, including:  Daily care of your teeth and gums.  Regular physical activity.  Eating a healthy diet.  Avoiding tobacco and drug use.  Limiting alcohol use.  Practicing safe sex.  Taking low doses of aspirin every day.  Taking vitamin and mineral supplements as recommended by your health care provider. What happens during an annual well check? The services and screenings done by your health care provider during your annual well check will depend on your age, overall health,  lifestyle risk factors, and family history of disease. Counseling  Your health care provider may ask you questions about your:  Alcohol use.  Tobacco use.  Drug use.  Emotional well-being.  Home and relationship well-being.  Sexual activity.  Eating habits.  History of falls.  Memory and ability to understand (cognition).  Work and work Statistician. Screening  You may have the following tests or measurements:  Height, weight, and BMI.  Blood pressure.  Lipid and cholesterol levels. These may be checked every 5 years, or more frequently if you are over 14 years old.  Skin check.  Lung cancer screening. You may have this screening every year starting at age 79 if you have a 30-pack-year history of smoking and currently smoke or have quit within the past 15 years.  Fecal occult blood test (FOBT) of the stool. You may have this test every year starting at age 47.  Flexible sigmoidoscopy or colonoscopy. You may have a sigmoidoscopy every 5 years or a colonoscopy every 10 years starting at age 8.  Prostate cancer screening. Recommendations will vary depending on your family history and other risks.  Hepatitis C blood test.  Hepatitis B blood test.  Sexually transmitted disease (STD) testing.  Diabetes screening. This is done by checking your blood sugar (glucose) after you have not eaten for a while (fasting). You may have this done every 1-3 years.  Abdominal aortic aneurysm (AAA) screening. You may need this if you are a current or former smoker.  Osteoporosis. You may be screened starting at age 63 if you are at high risk. Talk with your health care provider about your test results,  treatment options, and if necessary, the need for more tests. Vaccines  Your health care provider may recommend certain vaccines, such as:  Influenza vaccine. This is recommended every year.  Tetanus, diphtheria, and acellular pertussis (Tdap, Td) vaccine. You may need a Td booster  every 10 years.  Zoster vaccine. You may need this after age 69.  Pneumococcal 13-valent conjugate (PCV13) vaccine. One dose is recommended after age 82.  Pneumococcal polysaccharide (PPSV23) vaccine. One dose is recommended after age 17. Talk to your health care provider about which screenings and vaccines you need and how often you need them. This information is not intended to replace advice given to you by your health care provider. Make sure you discuss any questions you have with your health care provider. Document Released: 11/22/2015 Document Revised: 07/15/2016 Document Reviewed: 08/27/2015 Elsevier Interactive Patient Education  2017 [employer] Prevention in the Home Falls can cause injuries. They can happen to people of all ages. There are many things you can do to make your home safe and to help prevent falls. What can I do on the outside of my home?  Regularly fix the edges of walkways and driveways and fix any cracks.  Remove anything that might make you trip as you walk through a door, such as a raised step or threshold.  Trim any bushes or trees on the path to your home.  Use bright outdoor lighting.  Clear any walking paths of anything that might make someone trip, such as rocks or tools.  Regularly check to see if handrails are loose or broken. Make sure that both sides of any steps have handrails.  Any raised decks and porches should have guardrails on the edges.  Have any leaves, snow, or ice cleared regularly.  Use sand or salt on walking paths during winter.  Clean up any spills in your garage right away. This includes oil or grease spills. What can I do in the bathroom?  Use night lights.  Install grab bars by the toilet and in the tub and shower. Do not use towel bars as grab bars.  Use non-skid mats or decals in the tub or shower.  If you need to sit down in the shower, use a plastic, non-slip stool.  Keep the floor dry. Clean up any  water that spills on the floor as soon as it happens.  Remove soap buildup in the tub or shower regularly.  Attach bath mats securely with double-sided non-slip rug tape.  Do not have throw rugs and other things on the floor that can make you trip. What can I do in the bedroom?  Use night lights.  Make sure that you have a light by your bed that is easy to reach.  Do not use any sheets or blankets that are too big for your bed. They should not hang down onto the floor.  Have a firm chair that has side arms. You can use this for support while you get dressed.  Do not have throw rugs and other things on the floor that can make you trip. What can I do in the kitchen?  Clean up any spills right away.  Avoid walking on wet floors.  Keep items that you use a lot in easy-to-reach places.  If you need to reach something above you, use a strong step stool that has a grab bar.  Keep electrical cords out of the way.  Do not use floor polish or wax that makes floors  slippery. If you must use wax, use non-skid floor wax.  Do not have throw rugs and other things on the floor that can make you trip. What can I do with my stairs?  Do not leave any items on the stairs.  Make sure that there are handrails on both sides of the stairs and use them. Fix handrails that are broken or loose. Make sure that handrails are as long as the stairways.  Check any carpeting to make sure that it is firmly attached to the stairs. Fix any carpet that is loose or worn.  Avoid having throw rugs at the top or bottom of the stairs. If you do have throw rugs, attach them to the floor with carpet tape.  Make sure that you have a light switch at the top of the stairs and the bottom of the stairs. If you do not have them, ask someone to add them for you. What else can I do to help prevent falls?  Wear shoes that:  Do not have high heels.  Have rubber bottoms.  Are comfortable and fit you well.  Are closed  at the toe. Do not wear sandals.  If you use a stepladder:  Make sure that it is fully opened. Do not climb a closed stepladder.  Make sure that both sides of the stepladder are locked into place.  Ask someone to hold it for you, if possible.  Clearly mark and make sure that you can see:  Any grab bars or handrails.  First and last steps.  Where the edge of each step is.  Use tools that help you move around (mobility aids) if they are needed. These include:  Canes.  Walkers.  Scooters.  Crutches.  Turn on the lights when you go into a dark area. Replace any light bulbs as soon as they burn out.  Set up your furniture so you have a clear path. Avoid moving your furniture around.  If any of your floors are uneven, fix them.  If there are any pets around you, be aware of where they are.  Review your medicines with your doctor. Some medicines can make you feel dizzy. This can increase your chance of falling. Ask your doctor what other things that you can do to help prevent falls. This information is not intended to replace advice given to you by your health care provider. Make sure you discuss any questions you have with your health care provider. Document Released: 08/22/2009 Document Revised: 04/02/2016 Document Reviewed: 11/30/2014 Elsevier Interactive Patient Education  2017 [employer].

## 2020-07-31 ENCOUNTER — Other Ambulatory Visit: Payer: Self-pay

## 2020-07-31 ENCOUNTER — Ambulatory Visit (INDEPENDENT_AMBULATORY_CARE_PROVIDER_SITE_OTHER): Payer: Medicare PPO | Admitting: *Deleted

## 2020-07-31 DIAGNOSIS — Z23 Encounter for immunization: Secondary | ICD-10-CM

## 2020-08-02 ENCOUNTER — Other Ambulatory Visit: Payer: Self-pay | Admitting: Family Medicine

## 2020-08-02 DIAGNOSIS — I10 Essential (primary) hypertension: Secondary | ICD-10-CM

## 2020-08-14 ENCOUNTER — Other Ambulatory Visit: Payer: Self-pay | Admitting: Family Medicine

## 2020-08-14 DIAGNOSIS — E119 Type 2 diabetes mellitus without complications: Secondary | ICD-10-CM

## 2020-08-28 DIAGNOSIS — D1801 Hemangioma of skin and subcutaneous tissue: Secondary | ICD-10-CM | POA: Diagnosis not present

## 2020-08-28 DIAGNOSIS — L281 Prurigo nodularis: Secondary | ICD-10-CM | POA: Diagnosis not present

## 2020-08-28 DIAGNOSIS — L570 Actinic keratosis: Secondary | ICD-10-CM | POA: Diagnosis not present

## 2020-08-28 DIAGNOSIS — L814 Other melanin hyperpigmentation: Secondary | ICD-10-CM | POA: Diagnosis not present

## 2020-08-28 DIAGNOSIS — D2261 Melanocytic nevi of right upper limb, including shoulder: Secondary | ICD-10-CM | POA: Diagnosis not present

## 2020-08-28 DIAGNOSIS — L820 Inflamed seborrheic keratosis: Secondary | ICD-10-CM | POA: Diagnosis not present

## 2020-08-28 DIAGNOSIS — D2262 Melanocytic nevi of left upper limb, including shoulder: Secondary | ICD-10-CM | POA: Diagnosis not present

## 2020-08-28 DIAGNOSIS — L821 Other seborrheic keratosis: Secondary | ICD-10-CM | POA: Diagnosis not present

## 2020-11-05 ENCOUNTER — Ambulatory Visit: Payer: Medicare PPO | Admitting: Family Medicine

## 2020-11-05 ENCOUNTER — Other Ambulatory Visit: Payer: Self-pay

## 2020-11-05 ENCOUNTER — Encounter: Payer: Self-pay | Admitting: Family Medicine

## 2020-11-05 VITALS — BP 124/60 | HR 74 | Temp 98.4°F | Wt 197.0 lb

## 2020-11-05 DIAGNOSIS — E119 Type 2 diabetes mellitus without complications: Secondary | ICD-10-CM

## 2020-11-05 NOTE — Progress Notes (Signed)
Pt left the room before seen by Dr. Fry--pt stated --could not stay because  wife is in the car waiting.

## 2020-11-12 DIAGNOSIS — H2513 Age-related nuclear cataract, bilateral: Secondary | ICD-10-CM | POA: Diagnosis not present

## 2020-11-12 DIAGNOSIS — H43813 Vitreous degeneration, bilateral: Secondary | ICD-10-CM | POA: Diagnosis not present

## 2020-11-12 DIAGNOSIS — E119 Type 2 diabetes mellitus without complications: Secondary | ICD-10-CM | POA: Diagnosis not present

## 2020-11-12 LAB — HM DIABETES EYE EXAM

## 2020-11-13 ENCOUNTER — Encounter: Payer: Self-pay | Admitting: Family Medicine

## 2020-11-13 ENCOUNTER — Ambulatory Visit (INDEPENDENT_AMBULATORY_CARE_PROVIDER_SITE_OTHER): Payer: Medicare PPO | Admitting: Family Medicine

## 2020-11-13 ENCOUNTER — Other Ambulatory Visit: Payer: Self-pay

## 2020-11-13 VITALS — BP 115/64 | HR 72 | Temp 98.5°F | Ht 68.0 in | Wt 200.4 lb

## 2020-11-13 DIAGNOSIS — E119 Type 2 diabetes mellitus without complications: Secondary | ICD-10-CM | POA: Diagnosis not present

## 2020-11-13 MED ORDER — SITAGLIPTIN PHOSPHATE 100 MG PO TABS: 100.0000 mg | ORAL_TABLET | Freq: Every day | ORAL | 2 refills | Status: DC

## 2020-11-13 NOTE — Progress Notes (Signed)
   Subjective:    Patient ID: Walter Campbell, male    DOB: 1940-12-19, 80 y.o.   MRN: [id number]  HPI Here to discuss rising glucoses readings over the past month or two. His last A1c here in July was stable at 6.7. His am fasting glucoses had normally been in the range of 120-130, but recently they have been up to 150. His random glucoses have been in the mid to high 200s. He is taking Glipizide 5 mg BID and Metformin XR 500 mg 2 tabs BID. He feels fine.    Review of Systems  Constitutional: Negative.   Respiratory: Negative.   Cardiovascular: Negative.        Objective:   Physical Exam Constitutional:      Appearance: Normal appearance.  Cardiovascular:     Rate and Rhythm: Normal rate and regular rhythm.     Pulses: Normal pulses.     Heart sounds: Normal heart sounds.  Pulmonary:     Effort: Pulmonary effort is normal.     Breath sounds: Normal breath sounds.  Neurological:     Mental Status: He is alert.           Assessment & Plan:  Diabetes, not as well controlled. We will add Januvia 100 mg daily. Recheck at his well exam, which is due about a month from now.  Gershon Crane, MD

## 2020-11-27 ENCOUNTER — Encounter: Payer: Self-pay | Admitting: Family Medicine

## 2020-11-27 ENCOUNTER — Other Ambulatory Visit: Payer: Self-pay

## 2020-11-27 ENCOUNTER — Ambulatory Visit (INDEPENDENT_AMBULATORY_CARE_PROVIDER_SITE_OTHER): Payer: Medicare PPO | Admitting: Family Medicine

## 2020-11-27 VITALS — BP 104/72 | HR 70 | Ht 68.0 in | Wt 199.8 lb

## 2020-11-27 DIAGNOSIS — Z0000 Encounter for general adult medical examination without abnormal findings: Secondary | ICD-10-CM

## 2020-11-27 DIAGNOSIS — E119 Type 2 diabetes mellitus without complications: Secondary | ICD-10-CM | POA: Diagnosis not present

## 2020-11-27 LAB — HEPATIC FUNCTION PANEL
ALT: 12 U/L (ref 0–53)
AST: 12 U/L (ref 0–37)
Albumin: 4.4 g/dL (ref 3.5–5.2)
Alkaline Phosphatase: 63 U/L (ref 39–117)
Bilirubin, Direct: 0.1 mg/dL (ref 0.0–0.3)
Total Bilirubin: 0.4 mg/dL (ref 0.2–1.2)
Total Protein: 6.3 g/dL (ref 6.0–8.3)

## 2020-11-27 LAB — BASIC METABOLIC PANEL WITH GFR
BUN: 16 mg/dL (ref 6–23)
CO2: 29 meq/L (ref 19–32)
Calcium: 9.5 mg/dL (ref 8.4–10.5)
Chloride: 103 meq/L (ref 96–112)
Creatinine, Ser: 0.87 mg/dL (ref 0.40–1.50)
GFR: 82.83 mL/min
Glucose, Bld: 107 mg/dL — ABNORMAL HIGH (ref 70–99)
Potassium: 4 meq/L (ref 3.5–5.1)
Sodium: 139 meq/L (ref 135–145)

## 2020-11-27 LAB — HEMOGLOBIN A1C: Hgb A1c MFr Bld: 7.6 % — ABNORMAL HIGH (ref 4.6–6.5)

## 2020-11-27 LAB — LIPID PANEL
Cholesterol: 100 mg/dL (ref 0–200)
HDL: 48 mg/dL (ref >39.00)
LDL Cholesterol: 36 mg/dL (ref 0–99)
NonHDL: 52.22
Total CHOL/HDL Ratio: 2
Triglycerides: 80 mg/dL (ref 0.0–149.0)
VLDL: 16 mg/dL (ref 0.0–40.0)

## 2020-11-27 LAB — PSA: PSA: 4.16 ng/mL — ABNORMAL HIGH (ref 0.10–4.00)

## 2020-11-27 LAB — CBC WITH DIFFERENTIAL/PLATELET
Basophils Absolute: 0.1 K/uL (ref 0.0–0.1)
Basophils Relative: 1 % (ref 0.0–3.0)
Eosinophils Absolute: 0.2 K/uL (ref 0.0–0.7)
Eosinophils Relative: 2.7 % (ref 0.0–5.0)
HCT: 40.1 % (ref 39.0–52.0)
Hemoglobin: 13.4 g/dL (ref 13.0–17.0)
Lymphocytes Relative: 18.9 % (ref 12.0–46.0)
Lymphs Abs: 1.4 K/uL (ref 0.7–4.0)
MCHC: 33.3 g/dL (ref 30.0–36.0)
MCV: 88.4 fl (ref 78.0–100.0)
Monocytes Absolute: 0.5 K/uL (ref 0.1–1.0)
Monocytes Relative: 7.1 % (ref 3.0–12.0)
Neutro Abs: 5.1 K/uL (ref 1.4–7.7)
Neutrophils Relative %: 70.3 % (ref 43.0–77.0)
Platelets: 253 K/uL (ref 150.0–400.0)
RBC: 4.54 Mil/uL (ref 4.22–5.81)
RDW: 14 % (ref 11.5–15.5)
WBC: 7.2 K/uL (ref 4.0–10.5)

## 2020-11-27 LAB — TSH: TSH: 2.34 u[IU]/mL (ref 0.35–4.50)

## 2020-11-27 NOTE — Progress Notes (Signed)
Subjective:    Patient ID: Walter Campbell, male    DOB: [birth year], 80 y.o.   MRN: [id number]  HPI Here for a well exam. He feels fine. We added Januvia to his regimen a few weeks ago, and his glucoses have come down by an average of 7-10. His BP is stable.    Review of Systems  Constitutional: Negative.   HENT: Negative.   Eyes: Negative.   Respiratory: Negative.   Cardiovascular: Negative.   Gastrointestinal: Negative.   Genitourinary: Negative.   Musculoskeletal: Negative.   Skin: Negative.   Neurological: Negative.   Psychiatric/Behavioral: Negative.        Objective:   Physical Exam Constitutional:      General: He is not in acute distress.    Appearance: He is well-developed and well-nourished. He is not diaphoretic.  HENT:     Head: Normocephalic and atraumatic.     Right Ear: External ear normal.     Left Ear: External ear normal.     Nose: Nose normal.     Mouth/Throat:     Mouth: Oropharynx is clear and moist.     Pharynx: No oropharyngeal exudate.  Eyes:     General: No scleral icterus.       Right eye: No discharge.        Left eye: No discharge.     Extraocular Movements: EOM normal.     Conjunctiva/sclera: Conjunctivae normal.     Pupils: Pupils are equal, round, and reactive to light.  Neck:     Thyroid: No thyromegaly.     Vascular: No JVD.     Trachea: No tracheal deviation.  Cardiovascular:     Rate and Rhythm: Normal rate and regular rhythm.     Pulses: Intact distal pulses.     Heart sounds: Normal heart sounds. No murmur heard. No friction rub. No gallop.   Pulmonary:     Effort: Pulmonary effort is normal. No respiratory distress.     Breath sounds: Normal breath sounds. No wheezing or rales.  Chest:     Chest wall: No tenderness.  Abdominal:     General: Bowel sounds are normal. There is no distension.     Palpations: Abdomen is soft. There is no mass.     Tenderness: There is no abdominal tenderness. There is no guarding or  rebound.  Genitourinary:    Penis: Normal. No tenderness.      Testes: Normal.     Prostate: Normal.     Rectum: Normal. Guaiac result negative.  Musculoskeletal:        General: No tenderness or edema. Normal range of motion.     Cervical back: Neck supple.  Lymphadenopathy:     Cervical: No cervical adenopathy.  Skin:    General: Skin is warm and dry.     Coloration: Skin is not pale.     Findings: No erythema or rash.  Neurological:     Mental Status: He is alert and oriented to person, place, and time.     Cranial Nerves: No cranial nerve deficit.     Motor: No abnormal muscle tone.     Coordination: Coordination normal.     Deep Tendon Reflexes: Reflexes are normal and symmetric. Reflexes normal.  Psychiatric:        Mood and Affect: Mood and affect normal.        Behavior: Behavior normal.        Thought Content: Thought content normal.  Judgment: Judgment normal.           Assessment & Plan:  Well exam. We discussed diet and exercise. Get fasting labs including an A1c. Alysia Penna, MD

## 2020-11-27 NOTE — Addendum Note (Signed)
Addended by: Marrion Coy on: 11/27/2020 02:09 PM   Modules accepted: Orders

## 2020-12-04 ENCOUNTER — Other Ambulatory Visit: Payer: Self-pay | Admitting: Family Medicine

## 2021-01-02 ENCOUNTER — Other Ambulatory Visit: Payer: Self-pay

## 2021-01-03 ENCOUNTER — Other Ambulatory Visit: Payer: Medicare PPO

## 2021-01-03 ENCOUNTER — Ambulatory Visit: Payer: Medicare PPO | Admitting: Family Medicine

## 2021-01-08 ENCOUNTER — Ambulatory Visit: Payer: Medicare PPO | Admitting: Family Medicine

## 2021-01-10 ENCOUNTER — Ambulatory Visit: Payer: Medicare PPO | Admitting: Family Medicine

## 2021-01-10 ENCOUNTER — Other Ambulatory Visit: Payer: Self-pay

## 2021-01-10 ENCOUNTER — Encounter: Payer: Self-pay | Admitting: Family Medicine

## 2021-01-10 VITALS — BP 112/68 | HR 68 | Temp 98.1°F | Wt 196.2 lb

## 2021-01-10 DIAGNOSIS — E119 Type 2 diabetes mellitus without complications: Secondary | ICD-10-CM | POA: Diagnosis not present

## 2021-01-10 DIAGNOSIS — R972 Elevated prostate specific antigen [PSA]: Secondary | ICD-10-CM

## 2021-01-10 LAB — HEMOGLOBIN A1C: Hgb A1c MFr Bld: 7.6 % — ABNORMAL HIGH (ref 4.6–6.5)

## 2021-01-10 LAB — PSA: PSA: 2.83 ng/mL (ref 0.10–4.00)

## 2021-01-10 MED ORDER — CANAGLIFLOZIN 300 MG PO TABS: 300.0000 mg | ORAL_TABLET | Freq: Every day | ORAL | 5 refills | Status: DC

## 2021-01-10 NOTE — Progress Notes (Signed)
   Subjective:    Patient ID: Walter Campbell, male    DOB: Sep 22, 1941, 80 y.o.   MRN: [id number]  HPI Here to follow up on diabetes and elevated PSA. At his well exam in January, his A1c had gone up to 7.6 and the PSA was up to 4.16. He feels well. He watches the diet closely.   Review of Systems  Constitutional: Negative.   Respiratory: Negative.   Cardiovascular: Negative.        Objective:   Physical Exam Constitutional:      Appearance: Normal appearance.  Cardiovascular:     Rate and Rhythm: Normal rate and regular rhythm.     Pulses: Normal pulses.     Heart sounds: Normal heart sounds.  Pulmonary:     Effort: Pulmonary effort is normal.     Breath sounds: Normal breath sounds.  Neurological:     Mental Status: He is alert.           Assessment & Plan:  For the elevated PSA, we will recheck this today. For the diabetes, check another A1c. Stop Januvia and try Invokana 300 mg daily.  Alysia Penna, MD

## 2021-01-22 ENCOUNTER — Other Ambulatory Visit: Payer: Self-pay

## 2021-01-22 ENCOUNTER — Encounter: Payer: Self-pay | Admitting: Family Medicine

## 2021-01-22 ENCOUNTER — Ambulatory Visit: Payer: Medicare PPO | Admitting: Family Medicine

## 2021-01-22 VITALS — BP 108/58 | HR 69 | Temp 98.8°F | Wt 192.2 lb

## 2021-01-22 DIAGNOSIS — E119 Type 2 diabetes mellitus without complications: Secondary | ICD-10-CM

## 2021-01-22 DIAGNOSIS — R1084 Generalized abdominal pain: Secondary | ICD-10-CM | POA: Diagnosis not present

## 2021-01-22 MED ORDER — GLIPIZIDE 5 MG PO TABS: 10.0000 mg | ORAL_TABLET | Freq: Two times a day (BID) | ORAL | 0 refills | Status: DC

## 2021-01-22 NOTE — Progress Notes (Signed)
   Subjective:    Patient ID: Walter Campbell, male    DOB: Mar 10, 1941, 80 y.o.   MRN: [id number]  HPI Here for 10 days of generalized abdominal pains and bloating. We saw him on 01-10-21 and his A1c was 7.6 (this despite taking a trial of Januvia on top of Glipizide and Metformin). We stooped the Januvia and he started on Invokana. However after taking this for only 2 days, he developed these abdominal issues. His BMs remained the same, but he felt as if he was full of gas and could not get rid of it. No nausea or fever. He stopped taking the Invokana, but these symptoms have continued unabated. He gets some temporary relief with Gas X. Meanwhile his glucoses continue to climb, he has been getting random readings in the high 200's.    Review of Systems  Constitutional: Negative.   Respiratory: Negative.   Cardiovascular: Negative.   Gastrointestinal: Positive for abdominal distention and abdominal pain. Negative for anal bleeding, blood in stool, constipation, diarrhea, nausea, rectal pain and vomiting.  Genitourinary: Negative.        Objective:   Physical Exam Constitutional:      Appearance: Normal appearance. He is not ill-appearing.  Cardiovascular:     Rate and Rhythm: Normal rate and regular rhythm.     Pulses: Normal pulses.     Heart sounds: Normal heart sounds.  Pulmonary:     Effort: Pulmonary effort is normal.     Breath sounds: Normal breath sounds.  Abdominal:     General: Abdomen is flat. Bowel sounds are normal. There is no distension.     Palpations: Abdomen is soft. There is no mass.     Tenderness: There is no guarding or rebound.     Hernia: No hernia is present.     Comments: Mildly tender in the central abdomen   Neurological:     Mental Status: He is alert.           Assessment & Plan:  He has developed abdominal bloating and pain, which may or may not be related to trying Invokana. He will stay off [place] of course. Use Gas X as needed. He will  avoid bulky foods such as broccoli, cauliflower, lettuce, cabbage, and beans. We will set up an abdominal US soon. For the diabetes, he will increase the Glipizide to 10 mg BID. We will also refer him to Endocrinology.  Alysia Penna, MD

## 2021-01-24 ENCOUNTER — Ambulatory Visit: Payer: Medicare PPO | Admitting: Family Medicine

## 2021-01-27 ENCOUNTER — Other Ambulatory Visit: Payer: Self-pay

## 2021-01-27 ENCOUNTER — Ambulatory Visit (HOSPITAL_BASED_OUTPATIENT_CLINIC_OR_DEPARTMENT_OTHER)
Admission: RE | Admit: 2021-01-27 | Discharge: 2021-01-27 | Disposition: A | Discharge status: Home / Self Care | Payer: Medicare PPO | Source: Ambulatory Visit | Attending: Family Medicine | Admitting: Family Medicine

## 2021-01-27 DIAGNOSIS — R1084 Generalized abdominal pain: Secondary | ICD-10-CM | POA: Insufficient documentation

## 2021-01-27 DIAGNOSIS — K824 Cholesterolosis of gallbladder: Secondary | ICD-10-CM | POA: Diagnosis not present

## 2021-01-27 DIAGNOSIS — R140 Abdominal distension (gaseous): Secondary | ICD-10-CM | POA: Diagnosis not present

## 2021-02-07 ENCOUNTER — Other Ambulatory Visit: Payer: Medicare PPO

## 2021-02-11 ENCOUNTER — Other Ambulatory Visit: Payer: Self-pay | Admitting: Family Medicine

## 2021-02-13 ENCOUNTER — Other Ambulatory Visit: Payer: Self-pay | Admitting: Family Medicine

## 2021-02-14 ENCOUNTER — Other Ambulatory Visit: Payer: Self-pay | Admitting: Physician Assistant

## 2021-02-14 ENCOUNTER — Other Ambulatory Visit (HOSPITAL_BASED_OUTPATIENT_CLINIC_OR_DEPARTMENT_OTHER): Payer: Self-pay | Admitting: Physician Assistant

## 2021-02-14 DIAGNOSIS — R140 Abdominal distension (gaseous): Secondary | ICD-10-CM | POA: Diagnosis not present

## 2021-02-14 DIAGNOSIS — R1084 Generalized abdominal pain: Secondary | ICD-10-CM | POA: Diagnosis not present

## 2021-02-14 DIAGNOSIS — R634 Abnormal weight loss: Secondary | ICD-10-CM | POA: Diagnosis not present

## 2021-02-14 LAB — BASIC METABOLIC PANEL
CO2: 23 — AB (ref 13–22)
Chloride: 104 (ref 99–108)
Glucose: 157
Potassium: 4.1 (ref 3.4–5.3)
Sodium: 143 (ref 137–147)

## 2021-02-14 LAB — HEPATIC FUNCTION PANEL
ALT: 20 (ref 10–40)
AST: 19 (ref 14–40)

## 2021-02-14 LAB — CBC AND DIFFERENTIAL
HCT: 41 (ref 41–53)
Hemoglobin: 13.2 — AB (ref 13.5–17.5)
WBC: 8.9

## 2021-02-14 LAB — CBC: RBC: 4.59 (ref 3.87–5.11)

## 2021-02-14 LAB — COMPREHENSIVE METABOLIC PANEL
Albumin: 4.6 (ref 3.5–5.0)
Calcium: 9.6 (ref 8.7–10.7)

## 2021-02-21 ENCOUNTER — Ambulatory Visit (HOSPITAL_BASED_OUTPATIENT_CLINIC_OR_DEPARTMENT_OTHER): Admission: RE | Admit: 2021-02-21 | Payer: Medicare PPO | Source: Ambulatory Visit

## 2021-02-28 ENCOUNTER — Encounter (HOSPITAL_BASED_OUTPATIENT_CLINIC_OR_DEPARTMENT_OTHER): Payer: Self-pay | Admitting: Radiology

## 2021-02-28 ENCOUNTER — Ambulatory Visit (HOSPITAL_BASED_OUTPATIENT_CLINIC_OR_DEPARTMENT_OTHER)
Admission: RE | Admit: 2021-02-28 | Discharge: 2021-02-28 | Disposition: A | Discharge status: Home / Self Care | Payer: Medicare PPO | Source: Ambulatory Visit | Attending: Physician Assistant | Admitting: Physician Assistant

## 2021-02-28 ENCOUNTER — Other Ambulatory Visit: Payer: Self-pay

## 2021-02-28 DIAGNOSIS — K828 Other specified diseases of gallbladder: Secondary | ICD-10-CM | POA: Diagnosis not present

## 2021-02-28 DIAGNOSIS — R1084 Generalized abdominal pain: Secondary | ICD-10-CM | POA: Diagnosis not present

## 2021-02-28 LAB — POCT I-STAT CREATININE: Creatinine, Ser: 0.8 mg/dL (ref 0.61–1.24)

## 2021-02-28 MED ORDER — IOHEXOL 300 MG/ML  SOLN
80.0000 mL | Freq: Once | INTRAMUSCULAR | Status: AC | PRN
  Administered 2021-02-28: 80 mL via INTRAVENOUS

## 2021-03-10 ENCOUNTER — Other Ambulatory Visit: Payer: Self-pay

## 2021-03-10 ENCOUNTER — Ambulatory Visit: Payer: Medicare PPO | Admitting: Internal Medicine

## 2021-03-10 ENCOUNTER — Encounter: Payer: Self-pay | Admitting: Internal Medicine

## 2021-03-10 VITALS — BP 110/70 | HR 70 | Ht 69.0 in | Wt 194.5 lb

## 2021-03-10 DIAGNOSIS — E119 Type 2 diabetes mellitus without complications: Secondary | ICD-10-CM | POA: Insufficient documentation

## 2021-03-10 NOTE — Progress Notes (Signed)
Name: BURNELL HURTA  MRN/ DOB: [id number], 1941-05-14   Age/ Sex: 80 y.o., male    PCP: Laurey Morale, MD   Reason for Endocrinology Evaluation: Type 2 Diabetes Mellitus     Date of Initial Endocrinology Visit: 03/10/2021     PATIENT IDENTIFIER: Mr. KALEV TEMME is a 80 y.o. male with a past medical history of T2Dm, HTN and dyslipidemia and Hx of pancreatitis  . The patient presented for initial endocrinology clinic visit on 03/10/2021 for consultative assistance with his diabetes management.    HPI: Mr. Cott was    Diagnosed with DM in 2010 Prior Medications tried/Intolerance: Januvia - ineffective  Currently checking blood sugars multiple x / day, through CGM Hypoglycemia episodes : yes            Hemoglobin A1c has ranged from 6.7% in 2021, peaking at 7.6% in 2022. 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 Hypoglycemia during the day and night   Hyperglycemic episodes  Postprandial   Hypoglycemic episodes occurred overnight and during the day   Overnight periods: hypoglycemia      DIABETIC COMPLICATIONS: Microvascular complications:   Denies: CKD, retinopathy , neuropathy Last eye exam: Completed  11/2020  Macrovascular complications:   Denies: CAD, PVD, CVA   PAST HISTORY: Past Medical History:  Past Medical History:  Diagnosis Date  . Diabetes mellitus   . Hypertension   . Palpitations 06/10/2010   Past Surgical History:  Past Surgical History:  Procedure Laterality Date  . APPENDECTOMY  1957  . COLONOSCOPY  12/19/2018   per Dr. Cristina Gong, adenmatous polyps, repeat in 3 years   . [place], 2015, 2016   Torn meniscus L scope 2016, R 2015, L 1991 scope  . SALIVARY GLAND SURGERY     as baby- removal- states tumor    Social History:  reports that he has never smoked. He has never used smokeless tobacco. He reports current alcohol use of about 1.0 - 7.0 standard drink of alcohol per week. He reports that he does not use drugs. Family History:  Family History  Problem Relation Age of Onset  . Stroke Mother   . Diabetes Mother   . Osteoporosis Mother   . Dementia Mother   . Heart disease Mother        pacemaker  . Hyperlipidemia Father        diet controlled  . CAD Father        stents in 75s or 34s  . Arthritis Sister   . Hyperthyroidism Sister      HOME MEDICATIONS: Allergies  as of 03/10/2021   No Known Allergies     Medication List       Accurate as of Mar 10, 2021  1:41 PM. If you have any questions, ask your nurse or doctor.        atorvastatin 20 MG tablet Commonly known as: LIPITOR Twice a week (Mon, fridays)   benazepril-hydrochlorthiazide 20-25 MG tablet Commonly known as: LOTENSIN HCT TAKE 1 TABLET BY MOUTH EVERY DAY   felodipine 5 MG 24 hr tablet Commonly known as: PLENDIL TAKE 1 TABLET BY MOUTH EVERY DAY   FreeStyle Libre 14 Day Sensor Misc USE AS DIRECTED   glipiZIDE 5 MG tablet Commonly known as: GLUCOTROL Take 2 tablets (10 mg total) by mouth 2 (two) times daily before a meal.   metFORMIN 500 MG 24 hr tablet Commonly known as: GLUCOPHAGE-XR TAKE 2 TABLETS BY MOUTH TWICE A DAY WITH A MEAL.        ALLERGIES: No  Known Allergies   REVIEW OF SYSTEMS: A comprehensive ROS was conducted with the patient and is negative except as per HPI and below:  Review of Systems  Gastrointestinal: Positive for abdominal pain.       IN January/February  Genitourinary: Negative for frequency.  Neurological: Negative for tingling.  Endo/Heme/Allergies: Negative for polydipsia.      OBJECTIVE:   VITAL SIGNS: BP 110/70   Pulse 70   Ht 5\' 9"  (1.753 m)   Wt 194 lb 8 oz (88.2 kg)   SpO2 97%   BMI 28.72 kg/m    PHYSICAL EXAM:  General: Pt appears well and is in NAD  Neck: General: Supple without adenopathy or carotid bruits. Thyroid: Thyroid size normal.  No goiter or nodules appreciated.   Lungs: Clear with good BS bilat with no rales, rhonchi, or wheezes  Heart: RRR   Abdomen: Normoactive bowel sounds, soft, nontender, without masses or organomegaly palpable  Extremities:  Lower extremities - No pretibial edema. No lesions.  Skin: Normal texture and temperature to palpation. No rash noted. No Acanthosis nigricans/skin tags. No lipohypertrophy.  Neuro: MS is good with appropriate affect, pt is alert and Ox3    DM foot exam: 5/2/202  The skin of the feet is intact without sores or ulcerations. The pedal pulses are 2+ on right and 2+ on left. The sensation is intact to a screening 5.07, 10 gram monofilament bilaterally   DATA REVIEWED:  Lab Results  Component Value Date   HGBA1C 7.6 (H) 01/10/2021   HGBA1C 7.6 (H) 11/27/2020   HGBA1C 6.7 (A) 05/22/2020   Lab Results  Component Value Date   MICROALBUR 0.8 02/19/2014   LDLCALC 36 11/27/2020   CREATININE 0.80 02/28/2021   Lab Results  Component Value Date   MICRALBCREAT 0.3 02/19/2014    Lab Results  Component Value Date   CHOL 100 11/27/2020   HDL 48.00 11/27/2020   LDLCALC 36 11/27/2020   TRIG 80.0 11/27/2020   CHOLHDL 2 11/27/2020        ASSESSMENT / PLAN / RECOMMENDATIONS:   1) Type 2 Diabetes Mellitus, Sub-optimally  controlled, Without complications - Most recent A1c of7.6 %. Goal A1c < 7.5 %.    - His BG's have been optimal until earlier this year when his BG's increased to 160's and 170's, he was having abdominal at the time and a CT scan showed calcifications at the pancreatitic head consistent with chronic pancreatitis. He has no history or excessive ETOH intake  - We discussed fatal risk of  hypoglycemia with advanced age and at this time the priority is to eliminate hypoglycemia.  - We emphasized the impotance of low carb diet - We discussed options such as SGLt- 2 inhibitors as well as repaglinide - Will avoid GLp-1 agonists and DPP-4 inhibitors due to high risk of pancreatitis    MEDICATIONS: Decrease Glipizide 5 mg , ONE and a half  tablets before breakfast and ONE tablet before Supper  - Continue Metformin 500 mg, 2 tablet before Breakfast and 2 tablet before Supper    EDUCATION / INSTRUCTIONS: BG monitoring instructions: Patient is instructed to check his blood sugars 3 times a day, before meals . Call [facility] if: BG persistently < 70  I reviewed the Rule of 15 for the treatment of hypoglycemia in detail with the patient. Literature supplied.   2) Diabetic complications:  Eye: Does no have known diabetic retinopathy.  Neuro/ Feet: Does not have known diabetic peripheral neuropathy. Renal: Patient does not have known baseline CKD. He is on an ACEI/ARB at present.      F/U in 3 months     Signed electronically by: Mack Guise, MD  [facility]  [facility] [address] [place], [place] [address] Phone: [phone] FAX: [phone]   CC: Laurey Morale, [place] [address] Phone: [phone]  Fax: [phone]    Return to Endocrinology clinic as below: No future appointments.

## 2021-03-10 NOTE — Patient Instructions (Signed)
-   Decrease Glipizide 5 mg , ONE and a half  tablets before breakfast and ONE tablet before Supper  - Continue Metformin 500 mg, 2 tablet before Breakfast and 2 tablet before Supper      HOW TO TREAT LOW BLOOD SUGARS (Blood sugar LESS THAN 70 MG/DL)  Please follow the RULE OF 15 for the treatment of hypoglycemia treatment (when your (blood sugars are less than 70 mg/dL)    STEP 1: Take 15 grams of carbohydrates when your blood sugar is low, which includes:   3-4 GLUCOSE TABS  OR  3-4 OZ OF JUICE OR REGULAR SODA OR  ONE TUBE OF GLUCOSE GEL     STEP 2: RECHECK blood sugar in 15 MINUTES STEP 3: If your blood sugar is still low at the 15 minute recheck --> then, go back to STEP 1 and treat AGAIN with another 15 grams of carbohydrates.

## 2021-03-11 DIAGNOSIS — K861 Other chronic pancreatitis: Secondary | ICD-10-CM | POA: Diagnosis not present

## 2021-03-20 ENCOUNTER — Other Ambulatory Visit: Payer: Self-pay | Admitting: Family Medicine

## 2021-03-20 DIAGNOSIS — I10 Essential (primary) hypertension: Secondary | ICD-10-CM

## 2021-04-15 ENCOUNTER — Encounter: Payer: Self-pay | Admitting: Family Medicine

## 2021-04-15 DIAGNOSIS — K8689 Other specified diseases of pancreas: Secondary | ICD-10-CM | POA: Diagnosis not present

## 2021-04-15 DIAGNOSIS — R933 Abnormal findings on diagnostic imaging of other parts of digestive tract: Secondary | ICD-10-CM | POA: Diagnosis not present

## 2021-04-15 DIAGNOSIS — K869 Disease of pancreas, unspecified: Secondary | ICD-10-CM | POA: Diagnosis not present

## 2021-05-01 ENCOUNTER — Other Ambulatory Visit: Payer: Self-pay | Admitting: Family Medicine

## 2021-05-21 ENCOUNTER — Telehealth: Payer: Self-pay

## 2021-05-21 NOTE — Telephone Encounter (Signed)
Pt request sent to Dr Fry for advise 

## 2021-05-21 NOTE — Telephone Encounter (Signed)
Yes please send this in  

## 2021-06-11 ENCOUNTER — Encounter: Payer: Self-pay | Admitting: Internal Medicine

## 2021-06-11 ENCOUNTER — Ambulatory Visit: Payer: Medicare PPO | Admitting: Internal Medicine

## 2021-06-11 ENCOUNTER — Other Ambulatory Visit: Payer: Self-pay

## 2021-06-11 VITALS — BP 118/66 | HR 71 | Ht 66.0 in | Wt 198.4 lb

## 2021-06-11 DIAGNOSIS — E119 Type 2 diabetes mellitus without complications: Secondary | ICD-10-CM

## 2021-06-11 LAB — POCT GLYCOSYLATED HEMOGLOBIN (HGB A1C): Hemoglobin A1C: 6.8 % — AB (ref 4.0–5.6)

## 2021-06-11 MED ORDER — GLIPIZIDE 5 MG PO TABS: 5.0000 mg | ORAL_TABLET | Freq: Two times a day (BID) | ORAL | 3 refills | Status: DC

## 2021-06-11 MED ORDER — METFORMIN HCL ER 500 MG PO TB24: 1000.0000 mg | ORAL_TABLET | Freq: Two times a day (BID) | ORAL | 3 refills | Status: DC

## 2021-06-11 MED ORDER — FREESTYLE LIBRE 14 DAY SENSOR MISC: 1.0000 | 3 refills | Status: DC

## 2021-06-11 NOTE — Progress Notes (Signed)
Name: Walter Campbell  Age/ Sex: 80 y.o., male   MRN/ DOB: [id number], 12/23/1940     PCP: Laurey Morale, MD   Reason for Endocrinology Evaluation: Type 2 Diabetes Mellitus  Initial Endocrine Consultative Visit: 03/10/2021    PATIENT IDENTIFIER: Walter Campbell is a 80 y.o. male with a past medical history of T2Dm, HTN and dyslipidemia and Hx of pancreatitis  . The patient has followed with Endocrinology clinic since 03/10/2021 for consultative assistance with management of his diabetes.  DIABETIC HISTORY:  Walter Campbell was diagnosed with DM in 2010, januvia has been ineffective.Marland Kitchen His hemoglobin A1c has ranged from 6.7% in 2021, peaking at 7.6% in 2022.   On his initial visit to our clinic he had an A1c 7.6% , we decreased Glipizide due to hypoglycemia and continued Metformin   SUBJECTIVE:   During the last visit (03/10/2021): A1c 7.6 % , we decreased Glipizide and continued Metformin   Today (06/11/2021): Walter Campbell is here for a follow up on diabetes management . He checks his blood sugars multiple times daily, through CGM. 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   Currently on Creon  Has right knee pain  Has reduce glipizide further due to hypoglycemia   HOME DIABETES REGIMEN:  Glipizide 5 mg , ONE and a half  tablets before breakfast and ONE tablet before Supper- taking 1 tablet BID  Metformin 500 mg, 2 tablet before Breakfast and 2 tablet before Supper   Statin: yes ACE-I/ARB: yes Prior Diabetic Education: yes    CONTINUOUS GLUCOSE MONITORING RECORD INTERPRETATION    Dates of Recording: 7/21-06/11/2021  Sensor description: freestyle libre  Results statistics:   CGM use % of time 93  Average and SD 132/21.4  Time in range       93 %  % Time Above 180 7  % Time above 250 0  % Time Below target 0    Glycemic patterns summary: Optimaly BG's through the night, with postprandial hyperglycemia    Hyperglycemic episodes  post breakfast and Lunch   Hypoglycemic episodes occurred n/a  Overnight periods: optimal       DIABETIC COMPLICATIONS: Microvascular complications:   Denies: CKD, retinopathy, neuropathy Last Eye Exam: Completed 11/2020  Macrovascular complications:   Denies: CAD, CVA, PVD   HISTORY:  Past Medical History:  Past Medical History:  Diagnosis Date   Diabetes mellitus    Hypertension    Palpitations 06/10/2010   Past Surgical History:  Past Surgical History:  Procedure Laterality Date   APPENDECTOMY  1957   COLONOSCOPY  12/19/2018   per Dr. Cristina Gong, adenmatous polyps, repeat in 3 years    [place], 2015, 2016   Torn meniscus L scope 2016, R 2015, L 1991 scope   SALIVARY GLAND SURGERY     as baby- removal- states tumor   Social History:  reports that he has never smoked. He has never used smokeless tobacco. He reports current alcohol use of about 1.0 - 7.0 standard drink of alcohol per week. He reports that he does not use drugs. Family History:  Family History  Problem Relation Age of Onset   Stroke Mother    Diabetes Mother    Osteoporosis Mother    Dementia Mother    Heart disease Mother        pacemaker   Hyperlipidemia Father  diet controlled   CAD Father        stents in 15s or 44s   Arthritis Sister    Hyperthyroidism Sister      HOME MEDICATIONS: Allergies as of 06/11/2021   No Known Allergies      Medication List        Accurate as of June 11, 2021  2:08 PM. If you have any questions, ask your nurse or doctor.          atorvastatin 20 MG tablet Commonly known as: LIPITOR Twice a week (Mon, fridays)   benazepril-hydrochlorthiazide 20-25 MG tablet Commonly known as: LOTENSIN HCT TAKE 1 TABLET BY MOUTH EVERY DAY   felodipine 5 MG 24 hr tablet Commonly known as: PLENDIL TAKE 1 TABLET BY MOUTH EVERY DAY   FreeStyle Libre 14 Day Sensor Misc USE AS DIRECTED   glipiZIDE 5 MG  tablet Commonly known as: GLUCOTROL TAKE 1 TABLET (5 MG TOTAL) BY MOUTH 2 (TWO) TIMES DAILY BEFORE A MEAL.   metFORMIN 500 MG 24 hr tablet Commonly known as: GLUCOPHAGE-XR TAKE 2 TABLETS BY MOUTH TWICE A DAY WITH A MEAL         OBJECTIVE:   Vital Signs: BP 118/66 (BP Location: Left Arm, Patient Position: Sitting, Cuff Size: Normal)   Pulse 71   Ht '5\' 6"'$  (1.676 m)   Wt 198 lb 6.4 oz (90 kg)   SpO2 98%   BMI 32.02 kg/m   Wt Readings from Last 3 Encounters:  06/11/21 198 lb 6.4 oz (90 kg)  03/10/21 194 lb 8 oz (88.2 kg)  01/22/21 192 lb 3.2 oz (87.2 kg)     Exam: General: Pt appears well and is in NAD  Lungs: Clear with good BS bilat with no rales, rhonchi, or wheezes  Heart: RRR with normal S1 and S2 and no gallops; no murmurs; no rub  Abdomen: Normoactive bowel sounds, soft, nontender, without masses or organomegaly palpable  Extremities: Trace pretibial edema.   Neuro: MS is good with appropriate affect, pt is alert and Ox3       DM foot exam: 03/10/2021   The skin of the feet is intact without sores or ulcerations. The pedal pulses are 2+ on right and 2+ on left. The sensation is intact to a screening 5.07, 10 gram monofilament bilaterally   DATA REVIEWED:  Lab Results  Component Value Date   HGBA1C 6.8 (A) 06/11/2021   HGBA1C 7.6 (H) 01/10/2021   HGBA1C 7.6 (H) 11/27/2020   Lab Results  Component Value Date   MICROALBUR 0.8 02/19/2014   LDLCALC 36 11/27/2020   CREATININE 0.80 02/28/2021   Lab Results  Component Value Date   MICRALBCREAT 0.3 02/19/2014     Lab Results  Component Value Date   CHOL 100 11/27/2020   HDL 48.00 11/27/2020   LDLCALC 36 11/27/2020   TRIG 80.0 11/27/2020   CHOLHDL 2 11/27/2020         ASSESSMENT / PLAN / RECOMMENDATIONS:   1) Type 2 Diabetes Mellitus, Optimally controlled, Without complications - Most recent A1c of 6.8 %. Goal A1c < 7.5 %.     - Praised the pt on improved glycemic control  - Hypoglycemia  improved dramatically - Will avoid GLp-1 agonists and DPP-4 inhibitors due to high risk of pancreatitis  - Pt advised to use half a tablet of Glipizide before breakfast if he is going to be active during that day   MEDICATIONS: Continue Glipizide 5 mg , ONE  before breakfast and ONE tablet before Supper  - Continue Metformin 500 mg, 2 tablet before Breakfast and 2 tablet before Supper    EDUCATION / INSTRUCTIONS: BG monitoring instructions: Patient is instructed to check his blood sugars 3 times a day, before meals . Call [place] [facility] if: BG persistently < 70  I reviewed the Rule of 15 for the treatment of hypoglycemia in detail with the patient. Literature supplied.     2) Diabetic complications:  Eye: Does not have known diabetic retinopathy.  Neuro/ Feet: Does not have known diabetic peripheral neuropathy .  Renal: Patient does not have known baseline CKD. He   is  on an ACEI/ARB at present.      F/U in 6 months     Signed electronically by: Mack Guise, MD  [facility]  [address] [place],  [address] Phone: [phone] FAX: [phone]   CC: Laurey Morale, [place] [address] Phone: [phone]  Fax: [phone]  Return to Endocrinology clinic as below: No future appointments.

## 2021-06-11 NOTE — Patient Instructions (Addendum)
-   Keep up the Good Work !  - Continue Glipizide 5 mg , ONE  before breakfast and ONE tablet before Supper  - Continue Metformin 500 mg, 2 tablet before Breakfast and 2 tablet before Supper     HOW TO TREAT LOW BLOOD SUGARS (Blood sugar LESS THAN 70 MG/DL) Please follow the RULE OF 15 for the treatment of hypoglycemia treatment (when your (blood sugars are less than 70 mg/dL)   STEP 1: Take 15 grams of carbohydrates when your blood sugar is low, which includes:  3-4 GLUCOSE TABS  OR 3-4 OZ OF JUICE OR REGULAR SODA OR ONE TUBE OF GLUCOSE GEL    STEP 2: RECHECK blood sugar in 15 MINUTES STEP 3: If your blood sugar is still low at the 15 minute recheck --> then, go back to STEP 1 and treat AGAIN with another 15 grams of carbohydrates.

## 2021-06-20 ENCOUNTER — Telehealth: Payer: Self-pay | Admitting: Family Medicine

## 2021-06-20 NOTE — Telephone Encounter (Signed)
Left message for patient to call back and schedule Medicare Annual Wellness Visit (AWV) either virtually or in office.  Left both  my jabber number [phone] and office number    Last AWV 07/17/20 please schedule at anytime with [facility] Nurse Health Advisor 1 or 2   This should be a 45 minute visit.   Patient can have awv calendar year per [place]

## 2021-07-08 ENCOUNTER — Ambulatory Visit (INDEPENDENT_AMBULATORY_CARE_PROVIDER_SITE_OTHER): Payer: Medicare PPO

## 2021-07-08 DIAGNOSIS — Z0000 Encounter for general adult medical examination without abnormal findings: Secondary | ICD-10-CM

## 2021-07-08 NOTE — Patient Instructions (Signed)
[name] , Thank you for taking time to come for your Medicare Wellness Visit. I appreciate your ongoing commitment to your health goals. Please review the following plan we discussed and let me know if I can assist you in the future.   Screening recommendations/referrals: Colonoscopy: 12/30/2016 due 2028 Recommended yearly ophthalmology/optometry visit for glaucoma screening and checkup Recommended yearly dental visit for hygiene and checkup  Vaccinations: Influenza vaccine: due in fall 2022  Pneumococcal vaccine: completed series  Tdap vaccine: 12/30/2016 Shingles vaccine: Will consider     Advanced directives: will provide copies   Conditions/risks identified: none   Next appointment: 12/01/2021   200pm  CPE Dr.Fry    Preventive Care 65 Years and Older, Male Preventive care refers to lifestyle choices and visits with your health care provider that can promote health and wellness. What does preventive care include? A yearly physical exam. This is also called an annual well check. Dental exams once or twice a year. Routine eye exams. Ask your health care provider how often you should have your eyes checked. Personal lifestyle choices, including: Daily care of your teeth and gums. Regular physical activity. Eating a healthy diet. Avoiding tobacco and drug use. Limiting alcohol use. Practicing safe sex. Taking low doses of aspirin every day. Taking vitamin and mineral supplements as recommended by your health care provider. What happens during an annual well check? The services and screenings done by your health care provider during your annual well check will depend on your age, overall health, lifestyle risk factors, and family history of disease. Counseling  Your health care provider may ask you questions about your: Alcohol use. Tobacco use. Drug use. Emotional well-being. Home and relationship well-being. Sexual activity. Eating habits. History of falls. Memory  and ability to understand (cognition). Work and work Statistician. Screening  You may have the following tests or measurements: Height, weight, and BMI. Blood pressure. Lipid and cholesterol levels. These may be checked every 5 years, or more frequently if you are over 49 years old. Skin check. Lung cancer screening. You may have this screening every year starting at age 50 if you have a 30-pack-year history of smoking and currently smoke or have quit within the past 15 years. Fecal occult blood test (FOBT) of the stool. You may have this test every year starting at age 68. Flexible sigmoidoscopy or colonoscopy. You may have a sigmoidoscopy every 5 years or a colonoscopy every 10 years starting at age 21. Prostate cancer screening. Recommendations will vary depending on your family history and other risks. Hepatitis C blood test. Hepatitis B blood test. Sexually transmitted disease (STD) testing. Diabetes screening. This is done by checking your blood sugar (glucose) after you have not eaten for a while (fasting). You may have this done every 1-3 years. Abdominal aortic aneurysm (AAA) screening. You may need this if you are a current or former smoker. Osteoporosis. You may be screened starting at age 7 if you are at high risk. Talk with your health care provider about your test results, treatment options, and if necessary, the need for more tests. Vaccines  Your health care provider may recommend certain vaccines, such as: Influenza vaccine. This is recommended every year. Tetanus, diphtheria, and acellular pertussis (Tdap, Td) vaccine. You may need a Td booster every 10 years. Zoster vaccine. You may need this after age 52. Pneumococcal 13-valent conjugate (PCV13) vaccine. One dose is recommended after age 53. Pneumococcal polysaccharide (PPSV23) vaccine. One dose is recommended after age 60. Talk to  your health care provider about which screenings and vaccines you need and how often you  need them. This information is not intended to replace advice given to you by your health care provider. Make sure you discuss any questions you have with your health care provider. Document Released: 11/22/2015 Document Revised: 07/15/2016 Document Reviewed: 08/27/2015 Elsevier Interactive Patient Education  2017 [employer] Prevention in the Home Falls can cause injuries. They can happen to people of all ages. There are many things you can do to make your home safe and to help prevent falls. What can I do on the outside of my home? Regularly fix the edges of walkways and driveways and fix any cracks. Remove anything that might make you trip as you walk through a door, such as a raised step or threshold. Trim any bushes or trees on the path to your home. Use bright outdoor lighting. Clear any walking paths of anything that might make someone trip, such as rocks or tools. Regularly check to see if handrails are loose or broken. Make sure that both sides of any steps have handrails. Any raised decks and porches should have guardrails on the edges. Have any leaves, snow, or ice cleared regularly. Use sand or salt on walking paths during winter. Clean up any spills in your garage right away. This includes oil or grease spills. What can I do in the bathroom? Use night lights. Install grab bars by the toilet and in the tub and shower. Do not use towel bars as grab bars. Use non-skid mats or decals in the tub or shower. If you need to sit down in the shower, use a plastic, non-slip stool. Keep the floor dry. Clean up any water that spills on the floor as soon as it happens. Remove soap buildup in the tub or shower regularly. Attach bath mats securely with double-sided non-slip rug tape. Do not have throw rugs and other things on the floor that can make you trip. What can I do in the bedroom? Use night lights. Make sure that you have a light by your bed that is easy to reach. Do not  use any sheets or blankets that are too big for your bed. They should not hang down onto the floor. Have a firm chair that has side arms. You can use this for support while you get dressed. Do not have throw rugs and other things on the floor that can make you trip. What can I do in the kitchen? Clean up any spills right away. Avoid walking on wet floors. Keep items that you use a lot in easy-to-reach places. If you need to reach something above you, use a strong step stool that has a grab bar. Keep electrical cords out of the way. Do not use floor polish or wax that makes floors slippery. If you must use wax, use non-skid floor wax. Do not have throw rugs and other things on the floor that can make you trip. What can I do with my stairs? Do not leave any items on the stairs. Make sure that there are handrails on both sides of the stairs and use them. Fix handrails that are broken or loose. Make sure that handrails are as long as the stairways. Check any carpeting to make sure that it is firmly attached to the stairs. Fix any carpet that is loose or worn. Avoid having throw rugs at the top or bottom of the stairs. If you do have throw rugs, attach  them to the floor with carpet tape. Make sure that you have a light switch at the top of the stairs and the bottom of the stairs. If you do not have them, ask someone to add them for you. What else can I do to help prevent falls? Wear shoes that: Do not have high heels. Have rubber bottoms. Are comfortable and fit you well. Are closed at the toe. Do not wear sandals. If you use a stepladder: Make sure that it is fully opened. Do not climb a closed stepladder. Make sure that both sides of the stepladder are locked into place. Ask someone to hold it for you, if possible. Clearly mark and make sure that you can see: Any grab bars or handrails. First and last steps. Where the edge of each step is. Use tools that help you move around (mobility  aids) if they are needed. These include: Canes. Walkers. Scooters. Crutches. Turn on the lights when you go into a dark area. Replace any light bulbs as soon as they burn out. Set up your furniture so you have a clear path. Avoid moving your furniture around. If any of your floors are uneven, fix them. If there are any pets around you, be aware of where they are. Review your medicines with your doctor. Some medicines can make you feel dizzy. This can increase your chance of falling. Ask your doctor what other things that you can do to help prevent falls. This information is not intended to replace advice given to you by your health care provider. Make sure you discuss any questions you have with your health care provider. Document Released: 08/22/2009 Document Revised: 04/02/2016 Document Reviewed: 11/30/2014 Elsevier Interactive Patient Education  2017 [employer].

## 2021-07-08 NOTE — Progress Notes (Signed)
Subjective:   CORDELLE CHOUDHARY is a 80 y.o. male who presents for an Subsequent Medicare Annual Wellness Visit.   I connected with Gerlean Ren today by telephone and verified that I am speaking with the correct person using two identifiers. Location patient: home Location provider: work Persons participating in the virtual visit: patient, provider.   I discussed the limitations, risks, security and privacy concerns of performing an evaluation and management service by telephone and the availability of in person appointments. I also discussed with the patient that there may be a patient responsible charge related to this service. The patient expressed understanding and verbally consented to this telephonic visit.    Interactive audio and video telecommunications were attempted between this provider and patient, however failed, due to patient having technical difficulties OR patient did not have access to video capability.  We continued and completed visit with audio only.    Review of Systems    N/a       Objective:    There were no vitals filed for this visit. There is no height or weight on file to calculate BMI.  Advanced Directives 07/17/2020 04/29/2017 02/06/2015 12/26/2014  Does Patient Have a Medical Advance Directive? Yes Yes No Yes  Type of Paramedic of [place];Living will [place];Living will - Living will  Does patient want to make changes to medical advance directive? No - Patient declined - - No - Patient declined  Copy of [place] in Chart? No - copy requested No - copy requested - No - copy requested    Current Medications (verified) Outpatient Encounter Medications as of 07/08/2021  Medication Sig   atorvastatin (LIPITOR) 20 MG tablet Twice a week (Mon, fridays)   benazepril-hydrochlorthiazide (LOTENSIN HCT) 20-25 MG tablet TAKE 1 TABLET BY MOUTH EVERY DAY   Continuous Blood Gluc Sensor (FREESTYLE  LIBRE 14 DAY SENSOR) MISC Inject 1 Device into the skin See admin instructions.   felodipine (PLENDIL) 5 MG 24 hr tablet TAKE 1 TABLET BY MOUTH EVERY DAY   glipiZIDE (GLUCOTROL) 5 MG tablet Take 1 tablet (5 mg total) by mouth 2 (two) times daily before a meal.   metFORMIN (GLUCOPHAGE-XR) 500 MG 24 hr tablet Take 2 tablets (1,000 mg total) by mouth in the morning and at bedtime.   No facility-administered encounter medications on file as of 07/08/2021.    Allergies (verified) Patient has no known allergies.   History: Past Medical History:  Diagnosis Date   Diabetes mellitus    Hypertension    Palpitations 06/10/2010   Past Surgical History:  Procedure Laterality Date   APPENDECTOMY  1957   COLONOSCOPY  12/19/2018   per Dr. 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   Family History  Problem Relation Age of Onset   Stroke Mother    Diabetes Mother    Osteoporosis Mother    Dementia Mother    Heart disease Mother        pacemaker   Hyperlipidemia Father        diet controlled   CAD Father        stents in 74s or 42s   Arthritis Sister    Hyperthyroidism Sister    Social History   Socioeconomic History   Marital  status: Married    Spouse name: Not on file   Number of children: Not on file   Years of education: Not on file   Highest education level: Not on file  Occupational History   Not on file  Tobacco Use   Smoking status: Never   Smokeless tobacco: Never  Substance and Sexual Activity   Alcohol use: Yes    Alcohol/week: 1.0 - 7.0 standard drink    Types: 1 - 7 Standard drinks or equivalent per week   Drug use: No   Sexual activity: Not on file  Other Topics Concern   Not on file  Social History Narrative   Married. 1 daughter, 2 step sons. 8 grandkids. No greatgrandkids.       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  Sport and exercise psychologist: golf, bridge, travel, reading   Social Determinants of Radio broadcast assistant Strain: Low Risk    Difficulty of Paying Living Expenses: Not hard at [employer] Insecurity: No Food Insecurity   Worried About Charity fundraiser in the Last Year: Never true   Arboriculturist in the Last Year: Never true  Transportation Needs: No Data processing manager (Medical): No   Lack of Transportation (Non-Medical): No  Physical Activity: Insufficiently Active   Days of Exercise per Week: 3 days   Minutes of Exercise per Session: 40 min  Stress: No Stress Concern Present   Feeling of Stress : Not at all  Social Connections: Moderately Isolated   Frequency of Communication with Friends and Family: More than three times a week   Frequency of Social Gatherings with Friends and Family: Once a week   Attends Religious Services: Never   Marine scientist or Organizations: No   Attends Music therapist: Never   Marital Status: Married    Tobacco Counseling Counseling given: Not Answered   Clinical Intake:                 Diabetic?yes   Nutrition Risk Assessment:  Has the patient had any N/V/D within the last 2 months?  No  Does the patient have any non-healing wounds?  No  Has the patient had any unintentional weight loss or weight gain?  No   Diabetes:  Is the patient diabetic?  Yes  If diabetic, was a CBG obtained today?  No  Did the patient bring in their glucometer from home?  No  How often do you monitor your CBG's? Has freestyle libre.   Financial Strains and Diabetes Management:  Are you having any financial strains with the device, your supplies or your medication? No .  Does the patient want to be seen by Chronic Care Management for management of their diabetes?  No  Would the patient like to be referred to a Nutritionist or for Diabetic Management?  No   Diabetic Exams:  Diabetic Eye  Exam: Completed 11/2019 Diabetic Foot Exam: Overdue, Pt has been advised about the importance in completing this exam. Pt is scheduled for diabetic foot exam on next office visit .          Activities of Daily Living In your present state of health, do you have any difficulty performing the following activities: 07/17/2020  Hearing? N  Vision? N  Difficulty concentrating or making decisions? N  Walking or climbing stairs? N  Dressing or bathing? N  Doing errands, shopping? N  Preparing Food and eating ?  N  Using the Toilet? N  In the past six months, have you accidently leaked urine? Y  Comment Has occassional bladder leakage with urgency  Do you have problems with loss of bowel control? N  Managing your Medications? N  Managing your Finances? N  Housekeeping or managing your Housekeeping? N  Some recent data might be hidden    Patient Care Team: Laurey Morale, MD as PCP - General (Family Medicine) Rolm Bookbinder, MD as Consulting Physician (Dermatology) Marchia Bond, MD as Consulting Physician (Orthopedic Surgery)  Indicate any recent Medical Services you may have received from other than [facility] providers in the past year (date may be approximate).     Assessment:   This is a routine wellness examination for [place].  Hearing/Vision screen No results found.  Dietary issues and exercise activities discussed:     Goals Addressed   None    Depression Screen PHQ 2/9 Scores 11/13/2020 07/17/2020 11/21/2018 04/29/2017 07/10/2016 05/02/2015 02/19/2014  PHQ - 2 Score 0 0 0 0 0 0 0  PHQ- 9 Score - 0 - - - - -    Fall Risk Fall Risk  11/13/2020 07/17/2020 11/21/2018 04/29/2017 07/10/2016  Falls in the past year? 0 0 0 No Yes  Number falls in past yr: - 0 - - 1  Injury with Fall? - 0 - - No  Risk for fall due to : - Medication side effect - - -  Follow up - Falls evaluation completed;Falls prevention discussed - - -    FALL RISK PREVENTION PERTAINING TO THE HOME:  Any stairs in or  around the home? Yes  If so, are there any without handrails? Yes  Home free of loose throw rugs in walkways, pet beds, electrical cords, etc? No  Adequate lighting in your home to reduce risk of falls? Yes   ASSISTIVE DEVICES UTILIZED TO PREVENT FALLS:  Life alert? Yes  Use of a cane, walker or w/c? No  Grab bars in the bathroom? Yes  Shower chair or bench in shower? Yes  Elevated toilet seat or a handicapped toilet? Yes    Cognitive Function:  Normal cognitive status assessed by direct observation by this Nurse Health Advisor. No abnormalities found.        Immunizations Immunization History  Administered Date(s) Administered   Fluad Quad(high Dose 65+) 07/28/2019, 07/31/2020   Influenza Split 08/10/2011, 08/29/2012   Influenza Whole 12/04/2009, 09/10/2010   Influenza, High Dose Seasonal PF 07/10/2016, 08/20/2017, 09/12/2018   Influenza,inj,Quad PF,6+ Mos 07/19/2013, 10/02/2014, 09/06/2015   PFIZER(Purple Top)SARS-COV-2 Vaccination 12/14/2019, 01/08/2020, 08/18/2020   Pneumococcal Conjugate-13 11/27/2014   Pneumococcal Polysaccharide-23 12/04/2009   Td 11/09/2006   Tdap 12/30/2016   Zoster, Live 10/15/2011    TDAP status: Up to date  Flu Vaccine status: Up to date  Pneumococcal vaccine status: Up to date  Covid-19 vaccine status: Completed vaccines  Qualifies for Shingles Vaccine? Yes   Zostavax completed Yes   Shingrix Completed?: Yes  Screening Tests Health Maintenance  Topic Date Due   Hepatitis C Screening  Never done   FOOT EXAM  07/14/2018   COVID-19 Vaccine (4 - Booster for Pfizer series) 12/19/2020   Zoster Vaccines- Shingrix (2 of 2) 03/29/2021   INFLUENZA VACCINE  06/09/2021   OPHTHALMOLOGY EXAM  11/12/2021   HEMOGLOBIN A1C  12/12/2021   COLONOSCOPY (Pts 45-80yr Insurance coverage will need to be confirmed)  12/20/2023   TETANUS/TDAP  12/30/2026   PNA vac Low Risk Adult  Completed  HPV VACCINES  Aged Out    Health Maintenance  Health  Maintenance Due  Topic Date Due   Hepatitis C Screening  Never done   FOOT EXAM  07/14/2018   COVID-19 Vaccine (4 - Booster for Pfizer series) 12/19/2020   Zoster Vaccines- Shingrix (2 of 2) 03/29/2021   INFLUENZA VACCINE  06/09/2021    Colorectal cancer screening: Type of screening: Colonoscopy. Completed 12/30/2016. Repeat every 10 years   Lung Cancer Screening: (Low Dose CT Chest recommended if Age 76-80 years, 30 pack-year currently smoking OR have quit w/in 15years.) does not qualify.   Lung Cancer Screening Referral: n/a  Additional Screening:  Hepatitis C Screening: does qualify;   Vision Screening: Recommended annual ophthalmology exams for early detection of glaucoma and other disorders of the eye. Is the patient up to date with their annual eye exam?  Yes  Who is the provider or what is the name of the office in which the patient attends annual eye exams? Dr.Groat  If pt is not established with a provider, would they like to be referred to a provider to establish care? No .   Dental Screening: Recommended annual dental exams for proper oral hygiene  Community Resource Referral / Chronic Care Management: CRR required this visit?  No   CCM required this visit?  No      Plan:     I have personally reviewed and noted the following in the patient's chart:   Medical and social history Use of alcohol, tobacco or illicit drugs  Current medications and supplements including opioid prescriptions. Patient is not currently taking opioid prescriptions. Functional ability and status Nutritional status Physical activity Advanced directives List of other physicians Hospitalizations, surgeries, and ER visits in previous 12 months Vitals Screenings to include cognitive, depression, and falls Referrals and appointments  In addition, I have reviewed and discussed with patient certain preventive protocols, quality metrics, and best practice recommendations. A written  personalized care plan for preventive services as well as general preventive health recommendations were provided to patient.     Randel Pigg, LPN   [id number]   Nurse Notes: none

## 2021-09-02 ENCOUNTER — Other Ambulatory Visit: Payer: Self-pay | Admitting: Internal Medicine

## 2021-09-17 ENCOUNTER — Other Ambulatory Visit: Payer: Self-pay | Admitting: Family Medicine

## 2021-09-17 DIAGNOSIS — I10 Essential (primary) hypertension: Secondary | ICD-10-CM

## 2021-11-18 DIAGNOSIS — H43813 Vitreous degeneration, bilateral: Secondary | ICD-10-CM | POA: Diagnosis not present

## 2021-11-18 DIAGNOSIS — E119 Type 2 diabetes mellitus without complications: Secondary | ICD-10-CM | POA: Diagnosis not present

## 2021-11-18 DIAGNOSIS — H2512 Age-related nuclear cataract, left eye: Secondary | ICD-10-CM | POA: Diagnosis not present

## 2021-11-18 LAB — HM DIABETES EYE EXAM

## 2021-11-21 ENCOUNTER — Encounter: Payer: Self-pay | Admitting: Family Medicine

## 2021-11-27 DIAGNOSIS — Z86010 Personal history of colonic polyps: Secondary | ICD-10-CM | POA: Diagnosis not present

## 2021-11-27 DIAGNOSIS — K861 Other chronic pancreatitis: Secondary | ICD-10-CM | POA: Diagnosis not present

## 2021-12-01 ENCOUNTER — Ambulatory Visit (INDEPENDENT_AMBULATORY_CARE_PROVIDER_SITE_OTHER): Payer: Medicare PPO | Admitting: Family Medicine

## 2021-12-01 ENCOUNTER — Encounter: Payer: Self-pay | Admitting: Family Medicine

## 2021-12-01 VITALS — BP 98/60 | HR 70 | Temp 98.8°F | Ht 66.0 in | Wt 189.0 lb

## 2021-12-01 DIAGNOSIS — Z0000 Encounter for general adult medical examination without abnormal findings: Secondary | ICD-10-CM | POA: Diagnosis not present

## 2021-12-01 DIAGNOSIS — I10 Essential (primary) hypertension: Secondary | ICD-10-CM

## 2021-12-01 MED ORDER — FELODIPINE ER 5 MG PO TB24: 5.0000 mg | ORAL_TABLET | Freq: Every day | ORAL | 3 refills | Status: DC

## 2021-12-01 MED ORDER — ATORVASTATIN CALCIUM 20 MG PO TABS: ORAL_TABLET | ORAL | 3 refills | Status: DC

## 2021-12-01 MED ORDER — BENAZEPRIL-HYDROCHLOROTHIAZIDE 20-25 MG PO TABS: 1.0000 | ORAL_TABLET | Freq: Every day | ORAL | 3 refills | Status: DC

## 2021-12-01 NOTE — Progress Notes (Signed)
Subjective:    Patient ID: Walter Campbell, male    DOB: April 16, 1941, 81 y.o.   MRN: [id number]  HPI Here for a well exam. He is doing well in general. His chronic pancreatitis is controlled with Creon, but he does have constant bloating to deal with. He sees Dr. Kelton Pillar for the diabetes on 12-17-21. He will have a colonoscopy with Dr. Paulita Fujita in April. He will have bilateral cataracts removed in March.    Review of Systems  Constitutional: Negative.   HENT: Negative.    Eyes: Negative.   Respiratory: Negative.    Cardiovascular: Negative.   Gastrointestinal: Negative.   Genitourinary: Negative.   Musculoskeletal: Negative.   Skin: Negative.   Neurological: Negative.   Psychiatric/Behavioral: Negative.        Objective:   Physical Exam Constitutional:      General: He is not in acute distress.    Appearance: Normal appearance. He is well-developed. He is not diaphoretic.  HENT:     Head: Normocephalic and atraumatic.     Right Ear: External ear normal.     Left Ear: External ear normal.     Nose: Nose normal.     Mouth/Throat:     Pharynx: No oropharyngeal exudate.  Eyes:     General: No scleral icterus.       Right eye: No discharge.        Left eye: No discharge.     Conjunctiva/sclera: Conjunctivae normal.     Pupils: Pupils are equal, round, and reactive to light.  Neck:     Thyroid: No thyromegaly.     Vascular: No JVD.     Trachea: No tracheal deviation.  Cardiovascular:     Rate and Rhythm: Normal rate and regular rhythm.     Pulses: Normal pulses.     Heart sounds: Normal heart sounds. No murmur heard.   No friction rub. No gallop.  Pulmonary:     Effort: Pulmonary effort is normal. No respiratory distress.     Breath sounds: Normal breath sounds. No wheezing or rales.  Chest:     Chest wall: No tenderness.  Abdominal:     General: Bowel sounds are normal. There is no distension.     Palpations: Abdomen is soft. There is no mass.     Tenderness:  There is no abdominal tenderness. There is no guarding or rebound.  Genitourinary:    Penis: Normal. No tenderness.      Testes: Normal.     Prostate: Normal.     Rectum: Normal. Guaiac result negative.  Musculoskeletal:        General: No tenderness. Normal range of motion.     Cervical back: Neck supple.  Lymphadenopathy:     Cervical: No cervical adenopathy.  Skin:    General: Skin is warm and dry.     Coloration: Skin is not pale.     Findings: No erythema or rash.  Neurological:     Mental Status: He is alert and oriented to person, place, and time.     Cranial Nerves: No cranial nerve deficit.     Motor: No abnormal muscle tone.     Coordination: Coordination normal.     Deep Tendon Reflexes: Reflexes are normal and symmetric. Reflexes normal.  Psychiatric:        Behavior: Behavior normal.        Thought Content: Thought content normal.        Judgment: Judgment normal.  Assessment & Plan:  Well exam. We discussed diet and exercise. Get fasting labs soon.  Alysia Penna, MD

## 2021-12-04 ENCOUNTER — Other Ambulatory Visit (INDEPENDENT_AMBULATORY_CARE_PROVIDER_SITE_OTHER): Payer: Medicare PPO

## 2021-12-04 DIAGNOSIS — Z0000 Encounter for general adult medical examination without abnormal findings: Secondary | ICD-10-CM | POA: Diagnosis not present

## 2021-12-04 LAB — BASIC METABOLIC PANEL
BUN: 23 mg/dL (ref 6–23)
CO2: 28 mEq/L (ref 19–32)
Calcium: 9 mg/dL (ref 8.4–10.5)
Chloride: 104 mEq/L (ref 96–112)
Creatinine, Ser: 0.91 mg/dL (ref 0.40–1.50)
GFR: 80.33 mL/min (ref >60.00)
Glucose, Bld: 132 mg/dL — ABNORMAL HIGH (ref 70–99)
Potassium: 4 mEq/L (ref 3.5–5.1)
Sodium: 140 mEq/L (ref 135–145)

## 2021-12-04 LAB — LIPID PANEL
Cholesterol: 96 mg/dL (ref 0–200)
HDL: 43 mg/dL (ref >39.00)
LDL Cholesterol: 39 mg/dL (ref 0–99)
NonHDL: 53.48
Total CHOL/HDL Ratio: 2
Triglycerides: 73 mg/dL (ref 0.0–149.0)
VLDL: 14.6 mg/dL (ref 0.0–40.0)

## 2021-12-04 LAB — HEPATIC FUNCTION PANEL
ALT: 16 U/L (ref 0–53)
AST: 13 U/L (ref 0–37)
Albumin: 4.2 g/dL (ref 3.5–5.2)
Alkaline Phosphatase: 66 U/L (ref 39–117)
Bilirubin, Direct: 0.1 mg/dL (ref 0.0–0.3)
Total Bilirubin: 0.6 mg/dL (ref 0.2–1.2)
Total Protein: 6.2 g/dL (ref 6.0–8.3)

## 2021-12-04 LAB — CBC WITH DIFFERENTIAL/PLATELET
Basophils Absolute: 0.1 10*3/uL (ref 0.0–0.1)
Basophils Relative: 1.1 % (ref 0.0–3.0)
Eosinophils Absolute: 0.3 10*3/uL (ref 0.0–0.7)
Eosinophils Relative: 4.2 % (ref 0.0–5.0)
HCT: 41 % (ref 39.0–52.0)
Hemoglobin: 13.4 g/dL (ref 13.0–17.0)
Lymphocytes Relative: 19.8 % (ref 12.0–46.0)
Lymphs Abs: 1.4 10*3/uL (ref 0.7–4.0)
MCHC: 32.6 g/dL (ref 30.0–36.0)
MCV: 87.7 fl (ref 78.0–100.0)
Monocytes Absolute: 0.4 10*3/uL (ref 0.1–1.0)
Monocytes Relative: 5.3 % (ref 3.0–12.0)
Neutro Abs: 5 10*3/uL (ref 1.4–7.7)
Neutrophils Relative %: 69.6 % (ref 43.0–77.0)
Platelets: 239 10*3/uL (ref 150.0–400.0)
RBC: 4.68 Mil/uL (ref 4.22–5.81)
RDW: 14.8 % (ref 11.5–15.5)
WBC: 7.2 10*3/uL (ref 4.0–10.5)

## 2021-12-04 LAB — TSH: TSH: 1.51 u[IU]/mL (ref 0.35–5.50)

## 2021-12-04 LAB — PSA: PSA: 3.78 ng/mL (ref 0.10–4.00)

## 2021-12-16 ENCOUNTER — Encounter: Payer: Self-pay | Admitting: Internal Medicine

## 2021-12-17 ENCOUNTER — Ambulatory Visit: Payer: Medicare PPO | Admitting: Internal Medicine

## 2021-12-17 ENCOUNTER — Encounter: Payer: Self-pay | Admitting: Internal Medicine

## 2021-12-17 ENCOUNTER — Other Ambulatory Visit: Payer: Self-pay

## 2021-12-17 VITALS — BP 120/74 | HR 91 | Ht 66.0 in | Wt 192.0 lb

## 2021-12-17 DIAGNOSIS — E119 Type 2 diabetes mellitus without complications: Secondary | ICD-10-CM | POA: Diagnosis not present

## 2021-12-17 LAB — POCT GLYCOSYLATED HEMOGLOBIN (HGB A1C): Hemoglobin A1C: 7 % — AB (ref 4.0–5.6)

## 2021-12-17 MED ORDER — GLIPIZIDE 5 MG PO TABS: ORAL_TABLET | ORAL | 3 refills | Status: DC

## 2021-12-17 NOTE — Patient Instructions (Addendum)
°-   Change  Glipizide 5 mg , TWO before breakfast and ONE and a HALF  tablets before Supper  - Continue Metformin 500 mg, 2 tablet before Breakfast and 2 tablet before Supper     HOW TO TREAT LOW BLOOD SUGARS (Blood sugar LESS THAN 70 MG/DL) Please follow the RULE OF 15 for the treatment of hypoglycemia treatment (when your (blood sugars are less than 70 mg/dL)   STEP 1: Take 15 grams of carbohydrates when your blood sugar is low, which includes:  3-4 GLUCOSE TABS  OR 3-4 OZ OF JUICE OR REGULAR SODA OR ONE TUBE OF GLUCOSE GEL    STEP 2: RECHECK blood sugar in 15 MINUTES STEP 3: If your blood sugar is still low at the 15 minute recheck --> then, go back to STEP 1 and treat AGAIN with another 15 grams of carbohydes

## 2021-12-17 NOTE — Progress Notes (Signed)
Name: Walter Campbell  Age/ Sex: 81 y.o., male   MRN/ DOB: [id number], 1941/01/08     PCP: Laurey Morale, MD   Reason for Endocrinology Evaluation: Type 2 Diabetes Mellitus  Initial Endocrine Consultative Visit: 03/10/2021    PATIENT IDENTIFIER: Walter Campbell is a 81 y.o. male with a past medical history of T2Dm, HTN and dyslipidemia and Hx of pancreatitis  . The patient has followed with Endocrinology clinic since 03/10/2021 for consultative assistance with management of his diabetes.  DIABETIC HISTORY:  Walter Campbell was diagnosed with DM in 2010, januvia has been ineffective.Marland Kitchen His hemoglobin A1c has ranged from 6.7% in 2021, peaking at 7.6% in 2022.   On his initial visit to our clinic he had an A1c 7.6% , we decreased Glipizide due to hypoglycemia and continued Metformin   SUBJECTIVE:   During the last visit (8/32022): A1c 6.8 % , we continued glipizide and Metformin   Today (12/17/2021): Walter Campbell is here for a follow up on diabetes management . He checks his blood sugars multiple times daily, through CGM. 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   Currently on Creon  Denies nausea , vomiting and has loose stools    HOME DIABETES REGIMEN:  Glipizide 5 mg , 1 tablet twice daily- taking 2 tabs BID  Metformin 500 mg, 2 tablet before Breakfast and 2 tablet before Supper      Statin: yes ACE-I/ARB: yes Prior Diabetic Education: yes    CONTINUOUS GLUCOSE MONITORING RECORD INTERPRETATION    Dates of Recording:1/25-12/16/2021  Sensor description: freestyle libre 2  Results statistics:   CGM use % of time 69  Average and SD 144/25.6  Time in range     81 %  % Time Above 180 19  % Time above 250 0  % Time Below target 0    Glycemic patterns summary: Optimaly BG's through the night, with postprandial hyperglycemia   Hyperglycemic episodes  post prandial   Hypoglycemic episodes occurred fasting    Overnight periods: optimal       DIABETIC COMPLICATIONS: Microvascular complications:   Denies: CKD, retinopathy, neuropathy Last Eye Exam: Completed 11/2021  Macrovascular complications:   Denies: CAD, CVA, PVD   HISTORY:  Past Medical History:  Past Medical History:  Diagnosis Date   Diabetes mellitus    Hypertension    Palpitations 06/10/2010   Pancreatitis    Past Surgical History:  Past Surgical History:  Procedure Laterality Date   APPENDECTOMY  1957   COLONOSCOPY  12/19/2018   per Dr. Cristina Gong, adenmatous polyps, repeat in 3 years    [place], 2015, 2016   Torn meniscus L scope 2016, R 2015, L 1991 scope   SALIVARY GLAND SURGERY     as baby- removal- states tumor   Social History:  reports that he has never smoked. He has never used smokeless tobacco. He reports current alcohol use of about 1.0 - 7.0 standard drink per week. He reports that he does not use drugs. Family History:  Family History  Problem Relation Age of Onset   Stroke Mother    Diabetes Mother    Osteoporosis Mother    Dementia Mother    Heart disease Mother        pacemaker   Hyperlipidemia Father        diet controlled   CAD Father  stents in 38s or 16s   Arthritis Sister    Hyperthyroidism Sister      HOME MEDICATIONS: Allergies as of 12/17/2021   No Known Allergies      Medication List        Accurate as of December 17, 2021  2:20 PM. If you have any questions, ask your nurse or doctor.          atorvastatin 20 MG tablet Commonly known as: LIPITOR Take on Mondays, Wednesdays, and Fridays   benazepril-hydrochlorthiazide 20-25 MG tablet Commonly known as: LOTENSIN HCT Take 1 tablet by mouth daily.   felodipine 5 MG 24 hr tablet Commonly known as: PLENDIL Take 1 tablet (5 mg total) by mouth daily.   FreeStyle Libre 2 Sensor Misc Change sensor every 14 days   glipiZIDE 5 MG tablet Commonly known as: GLUCOTROL Take 1 tablet (5 mg total) by  mouth 2 (two) times daily before a meal.   lipase/protease/amylase 12000-38000 units Cpep capsule Commonly known as: CREON Take 24,000 Units by mouth 7 days.   metFORMIN 500 MG 24 hr tablet Commonly known as: GLUCOPHAGE-XR Take 2 tablets (1,000 mg total) by mouth in the morning and at bedtime. What changed: Another medication with the same name was removed. Continue taking this medication, and follow the directions you see here. Changed by: Dorita Sciara, MD         OBJECTIVE:   Vital Signs: BP 120/74 (BP Location: Left Arm, Patient Position: Sitting, Cuff Size: Small)    Pulse 91    Ht 5\' 6"  (1.676 m)    Wt 192 lb (87.1 kg)    SpO2 95%    BMI 30.99 kg/m   Wt Readings from Last 3 Encounters:  12/17/21 192 lb (87.1 kg)  12/01/21 189 lb (85.7 kg)  06/11/21 198 lb 6.4 oz (90 kg)     Exam: General: Pt appears well and is in NAD  Lungs: Clear with good BS bilat with no rales, rhonchi, or wheezes  Heart: RRR with normal S1 and S2 and no gallops; no murmurs; no rub  Abdomen: Normoactive bowel sounds, soft, nontender, without masses or organomegaly palpable  Extremities: Trace pretibial edema.   Neuro: MS is good with appropriate affect, pt is alert and Ox3       DM foot exam: 03/10/2021   The skin of the feet is intact without sores or ulcerations. The pedal pulses are 2+ on right and 2+ on left. The sensation is intact to a screening 5.07, 10 gram monofilament bilaterally   DATA REVIEWED:  Lab Results  Component Value Date   HGBA1C 7.0 (A) 12/17/2021   HGBA1C 6.8 (A) 06/11/2021   HGBA1C 7.6 (H) 01/10/2021   Lab Results  Component Value Date   MICROALBUR 0.8 02/19/2014   LDLCALC 39 12/04/2021   CREATININE 0.91 12/04/2021   Lab Results  Component Value Date   MICRALBCREAT 0.3 02/19/2014     Lab Results  Component Value Date   CHOL 96 12/04/2021   HDL 43.00 12/04/2021   LDLCALC 39 12/04/2021   TRIG 73.0 12/04/2021   CHOLHDL 2 12/04/2021         Latest Reference Range & Units 12/04/21 09:12  Sodium 135 - 145 mEq/L 140  Potassium 3.5 - 5.1 mEq/L 4.0  Chloride 96 - 112 mEq/L 104  CO2 19 - 32 mEq/L 28  Glucose 70 - 99 mg/dL 132 (H)  BUN 6 - 23 mg/dL 23  Creatinine 0.40 - 1.50 mg/dL  0.91  Calcium 8.4 - 10.5 mg/dL 9.0  Alkaline Phosphatase 39 - 117 U/L 66  Albumin 3.5 - 5.2 g/dL 4.2  AST 0 - 37 U/L 13  ALT 0 - 53 U/L 16  Total Protein 6.0 - 8.3 g/dL 6.2  Bilirubin, Direct 0.0 - 0.3 mg/dL 0.1  Total Bilirubin 0.2 - 1.2 mg/dL 0.6  GFR >60.00 mL/min 80.33    ASSESSMENT / PLAN / RECOMMENDATIONS:   1) Type 2 Diabetes Mellitus, Optimally controlled, Without complications - Most recent A1c of 7.0%. Goal A1c < 7.5 %.     -A1c remains stable -He has self increased his glipizide due to hyperglycemia, unfortunately he has been noted with fasting hypoglycemia and we will reduce his evening dose of glipizide as below - Will avoid GLp-1 agonists and DPP-4 inhibitors due to history of pancreatitis   MEDICATIONS: Continue Glipizide 5 mg , 2 tablets before breakfast and 1.5 tablet before Supper  Continue Metformin 500 mg, 2 tablet before Breakfast and 2 tablet before Supper    EDUCATION / INSTRUCTIONS: BG monitoring instructions: Patient is instructed to check his blood sugars 3 times a day, before meals . Call [facility] if: BG persistently < 70  I reviewed the Rule of 15 for the treatment of hypoglycemia in detail with the patient. Literature supplied.     2) Diabetic complications:  Eye: Does not have known diabetic retinopathy.  Neuro/ Feet: Does not have known diabetic peripheral neuropathy .  Renal: Patient does not have known baseline CKD. He   is  on an ACEI/ARB at present.      F/U in 6 months     Signed electronically by: Mack Guise, MD  [facility]  [facility] [address] [place], [place] [address] Phone: [phone] FAX:  [phone]   CC: Laurey Morale, [place] [address] Phone: [phone]  Fax: [phone]  Return to Endocrinology clinic as below: No future appointments.

## 2021-12-23 DIAGNOSIS — D1801 Hemangioma of skin and subcutaneous tissue: Secondary | ICD-10-CM | POA: Diagnosis not present

## 2021-12-23 DIAGNOSIS — L570 Actinic keratosis: Secondary | ICD-10-CM | POA: Diagnosis not present

## 2021-12-23 DIAGNOSIS — L718 Other rosacea: Secondary | ICD-10-CM | POA: Diagnosis not present

## 2021-12-23 DIAGNOSIS — D692 Other nonthrombocytopenic purpura: Secondary | ICD-10-CM | POA: Diagnosis not present

## 2021-12-23 DIAGNOSIS — L814 Other melanin hyperpigmentation: Secondary | ICD-10-CM | POA: Diagnosis not present

## 2021-12-23 DIAGNOSIS — D2262 Melanocytic nevi of left upper limb, including shoulder: Secondary | ICD-10-CM | POA: Diagnosis not present

## 2021-12-23 DIAGNOSIS — D225 Melanocytic nevi of trunk: Secondary | ICD-10-CM | POA: Diagnosis not present

## 2021-12-23 DIAGNOSIS — L905 Scar conditions and fibrosis of skin: Secondary | ICD-10-CM | POA: Diagnosis not present

## 2021-12-23 DIAGNOSIS — L821 Other seborrheic keratosis: Secondary | ICD-10-CM | POA: Diagnosis not present

## 2021-12-23 DIAGNOSIS — D2272 Melanocytic nevi of left lower limb, including hip: Secondary | ICD-10-CM | POA: Diagnosis not present

## 2022-01-22 DIAGNOSIS — H2512 Age-related nuclear cataract, left eye: Secondary | ICD-10-CM | POA: Diagnosis not present

## 2022-02-01 DIAGNOSIS — H2511 Age-related nuclear cataract, right eye: Secondary | ICD-10-CM | POA: Diagnosis not present

## 2022-02-05 DIAGNOSIS — H2511 Age-related nuclear cataract, right eye: Secondary | ICD-10-CM | POA: Diagnosis not present

## 2022-02-10 ENCOUNTER — Other Ambulatory Visit: Payer: Self-pay | Admitting: Physician Assistant

## 2022-02-10 DIAGNOSIS — K861 Other chronic pancreatitis: Secondary | ICD-10-CM

## 2022-02-28 ENCOUNTER — Other Ambulatory Visit: Payer: Self-pay | Admitting: Family Medicine

## 2022-03-02 NOTE — Telephone Encounter (Signed)
Pt LOV was on 12/01/2021 ?Last refill done on 07/08/2021 ?Please advise if ok to refill Rx ?

## 2022-03-03 NOTE — Telephone Encounter (Signed)
This should go to his GI doctor, Dr. Paulita Fujita  ?

## 2022-03-04 DIAGNOSIS — Z86010 Personal history of colonic polyps: Secondary | ICD-10-CM | POA: Diagnosis not present

## 2022-03-12 ENCOUNTER — Ambulatory Visit
Admission: RE | Admit: 2022-03-12 | Discharge: 2022-03-12 | Disposition: A | Discharge status: Home / Self Care | Payer: Medicare PPO | Source: Ambulatory Visit | Attending: Physician Assistant | Admitting: Physician Assistant

## 2022-03-12 DIAGNOSIS — K861 Other chronic pancreatitis: Secondary | ICD-10-CM

## 2022-03-12 DIAGNOSIS — N281 Cyst of kidney, acquired: Secondary | ICD-10-CM | POA: Diagnosis not present

## 2022-03-12 MED ORDER — GADOBENATE DIMEGLUMINE 529 MG/ML IV SOLN
17.0000 mL | Freq: Once | INTRAVENOUS | Status: AC | PRN
  Administered 2022-03-12: 17 mL via INTRAVENOUS

## 2022-03-26 ENCOUNTER — Telehealth: Payer: Self-pay | Admitting: Family Medicine

## 2022-03-26 DIAGNOSIS — D49511 Neoplasm of unspecified behavior of right kidney: Secondary | ICD-10-CM | POA: Diagnosis not present

## 2022-03-26 NOTE — Telephone Encounter (Signed)
[facility] needs CT abdomen pelvis w contrast and MR abdomen w wo contrast to go along with the referral that has been sent over to them    Please fax to [phone]      Please advise

## 2022-03-26 NOTE — Telephone Encounter (Signed)
Orders requested.   Please advise

## 2022-03-30 NOTE — Telephone Encounter (Signed)
I don't understand. He has already had both of these scans. [facility] should be able to access them

## 2022-03-30 NOTE — Telephone Encounter (Signed)
Lvm for Marybeth, with below message from Dr.Fry.

## 2022-04-01 ENCOUNTER — Other Ambulatory Visit: Payer: Self-pay | Admitting: Urology

## 2022-04-01 DIAGNOSIS — N2889 Other specified disorders of kidney and ureter: Secondary | ICD-10-CM

## 2022-04-02 ENCOUNTER — Ambulatory Visit
Admission: RE | Admit: 2022-04-02 | Discharge: 2022-04-02 | Disposition: A | Discharge status: Home / Self Care | Payer: Medicare PPO | Source: Ambulatory Visit | Attending: Urology | Admitting: Urology

## 2022-04-02 DIAGNOSIS — N2889 Other specified disorders of kidney and ureter: Secondary | ICD-10-CM

## 2022-04-02 HISTORY — PX: IR RADIOLOGIST EVAL & MGMT: IMG5224

## 2022-04-02 NOTE — Consult Note (Signed)
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 male with a past medical history of type 2 diabetes, hypertension, hypercholesterolemia.  He was incidentally found to have a 2 cm partially cystic mass involving the right kidney initially by CT in April 2022.  Lesion measured 1.7 cm initially.  Follow-up imaging was performed by MRI on 03/12/2022 related to work-up for pancreatitis and pancreatic duct stone disease.  Lesion by MRI suspicious for a complex cystic lesion with subtle enhancement suspicious for a cystic renal neoplasm.  By MRI lesion measures 2.2 cm.  Today's visit is to discuss management with image guided cryoablation.  He remains asymptomatic.  No significant flank or abdominal pain.  No hematuria.  No other urinary tract symptoms.  Normal renal function and creatinine.  Past Medical History:  Diagnosis Date   Diabetes mellitus    Hypertension    Palpitations 06/10/2010   Pancreatitis     Past Surgical History:  Procedure Laterality Date   APPENDECTOMY  1957   COLONOSCOPY  12/19/2018   per Dr. Cristina Gong, adenmatous polyps, repeat in 3 years    [place], 2015, 2016   Torn meniscus L scope 2016, R 2015, L 1991 scope   SALIVARY GLAND SURGERY     as baby- removal- states tumor    Allergies: Patient has no known allergies.  Medications: Prior to Admission medications   Medication Sig Start Date End Date Taking? Authorizing Provider  atorvastatin (LIPITOR) 20 MG tablet Take on Mondays, Wednesdays, and Fridays 12/01/21   Laurey Morale, MD  benazepril-hydrochlorthiazide (LOTENSIN HCT) 20-25 MG tablet Take 1 tablet by mouth daily. 12/01/21   Laurey Morale, MD  Continuous Blood Gluc Sensor (FREESTYLE LIBRE 2 SENSOR) MISC Change sensor every 14 days 09/02/21   Shamleffer, Melanie Crazier, MD  felodipine (PLENDIL) 5 MG 24 hr tablet Take 1  tablet (5 mg total) by mouth daily. 12/01/21   Laurey Morale, MD  glipiZIDE (GLUCOTROL) 5 MG tablet Take 2 tablets (10 mg total) by mouth daily before breakfast AND 1.5 tablets (7.5 mg total) daily before supper. 12/17/21   Shamleffer, Melanie Crazier, MD  lipase/protease/amylase (CREON) 12000-38000 units CPEP capsule Take 24,000 Units by mouth 7 days.    [provider]  metFORMIN (GLUCOPHAGE-XR) 500 MG 24 hr tablet Take 2 tablets (1,000 mg total) by mouth in the morning and at bedtime. 06/11/21   Shamleffer, Melanie Crazier, MD     Family History  Problem Relation Age of Onset   Stroke Mother    Diabetes Mother    Osteoporosis Mother    Dementia Mother    Heart disease Mother        pacemaker   Hyperlipidemia Father        diet controlled   CAD Father        stents in 23s or 100s   Arthritis Sister    Hyperthyroidism Sister     Social History   Socioeconomic History   Marital status: Married    Spouse name: Not on file   Number of children: Not on file   Years of education: Not on file   Highest education level: Not on file  Occupational History   Not on file  Tobacco Use   Smoking status: Never   Smokeless tobacco: Never  Substance and Sexual Activity   Alcohol use: Yes  Alcohol/week: 1.0 - 7.0 standard drink    Types: 1 - 7 Standard drinks or equivalent per week   Drug use: No   Sexual activity: Not on file  Other Topics Concern   Not on file  Social History Narrative   Married. 1 daughter, 2 step sons. 8 grandkids. No greatgrandkids.       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, bridge, travel, reading   Social Determinants of Radio broadcast assistant Strain: Low Risk    Difficulty of Paying Living Expenses: Not hard at [employer] Insecurity: No Food Insecurity   Worried About Charity fundraiser in the Last Year: Never true   Arboriculturist in the Last Year: Never true  Transportation Needs: No Estate agent (Medical): No   Lack of Transportation (Non-Medical): No  Physical Activity: Insufficiently Active   Days of Exercise per Week: 3 days   Minutes of Exercise per Session: 30 min  Stress: No Stress Concern Present   Feeling of Stress : Not at all  Social Connections: Moderately Isolated   Frequency of Communication with Friends and Family: Three times a week   Frequency of Social Gatherings with Friends and Family: Three times a week   Attends Religious Services: Never   Active Member of Clubs or Organizations: No   Attends Archivist Meetings: Never   Marital Status: Married     Review of Systems: A 12 point ROS discussed and pertinent positives are indicated in the HPI above.  All other systems are negative.  Review of Systems  Vital Signs: BP (!) 146/74 (BP Location: Left Arm)   Pulse 71   SpO2 96%   Physical Exam Constitutional:      General: He is not in acute distress.    Appearance: He is not ill-appearing.  Eyes:     General: No scleral icterus.    Conjunctiva/sclera: Conjunctivae normal.  Cardiovascular:     Rate and Rhythm: Normal rate and regular rhythm.     Heart sounds: No murmur heard. Pulmonary:     Effort: Pulmonary effort is normal.     Breath sounds: Normal breath sounds.  Abdominal:     General: Abdomen is flat. Bowel sounds are normal.     Palpations: Abdomen is soft.  Skin:    Coloration: Skin is not jaundiced.  Neurological:     General: No focal deficit present.     Mental Status: He is alert. Mental status is at baseline.  Psychiatric:        Mood and Affect: Mood normal.        Thought Content: Thought content normal.    Imaging: MR ABDOMEN WWO CONTRAST  Result Date: 03/12/2022 CLINICAL DATA:  Chronic pancreatitis EXAM: MRI ABDOMEN WITHOUT AND WITH CONTRAST TECHNIQUE: Multiplanar multisequence MR imaging of the abdomen was performed both before and after the administration of intravenous contrast.  CONTRAST:  43m MULTIHANCE GADOBENATE DIMEGLUMINE 529 MG/ML IV SOLN COMPARISON:  CT abdomen and pelvis 02/28/2021 FINDINGS: Study is limited due to motion. Lower chest: No acute findings. Hepatobiliary: Liver is normal in size and contour with no suspicious mass identified. Gallbladder appears normal. No biliary ductal dilatation identified. Pancreas: Pancreatic duct is mildly tortuous and diffusely dilated measuring up to 1.4 cm in diameter in the head of the pancreas near the ampulla. There is an approximately 1.3 cm calculus within the duct near the  ampulla as well as smaller debris/calculi more proximally in the pancreatic duct within the head of the pancreas. Pancreas is moderately atrophic. No definite acute inflammatory changes of the pancreas. Spleen:  Within normal limits in size and appearance. Adrenals/Urinary Tract: Adrenal glands appear normal. Interval enlargement of a complex cystic mass in the mid right kidney which demonstrates internal hypointense T2 nodular signal inferiorly with apparent mild enhancement. This lesion appears retrospectively enlarged since previous CT and ultrasound. No additional renal lesions identified. No hydronephrosis. Stomach/Bowel: Visualized portions within the abdomen are unremarkable. Vascular/Lymphatic: No pathologically enlarged lymph nodes identified. No abdominal aortic aneurysm demonstrated. Other:  No ascites. Musculoskeletal: No suspicious bone lesions identified. IMPRESSION: 1. Complex cystic mass in the mid right kidney with nodular apparent enhancing component suspicious for neoplasm. 2. Evidence of chronic pancreatitis. Intraductal calculi and debris including a prominent obstructing calculus near the ampulla. Associated pancreatic ductal dilatation and tortuosity. Electronically Signed   By: Ofilia Neas M.D.   On: 03/12/2022 15:32    Labs:  CBC: Recent Labs    12/04/21 0912  WBC 7.2  HGB 13.4  HCT 41.0  PLT 239.0    COAGS: No results for  input(s): INR, APTT in the last 8760 hours.  BMP: Recent Labs    12/04/21 0912  NA 140  K 4.0  CL 104  CO2 28  GLUCOSE 132*  BUN 23  CALCIUM 9.0  CREATININE 0.91    LIVER FUNCTION TESTS: Recent Labs    12/04/21 0912  BILITOT 0.6  AST 13  ALT 16  ALKPHOS 66  PROT 6.2  ALBUMIN 4.2     Assessment and Plan:  Right kidney lateral midpole complex cystic mass with inferior nodular component with enhancement suspicious for cystic renal neoplasm.  Imaging surveillance does show slight enlargement.  Lesion size and location is amenable to image guided cryoablation.  The cryoablation procedure was reviewed in detail including the procedure, risk, benefits and alternatives.  The additional options including continued surveillance were discussed.  He understands the procedure requires general anesthesia and possibly an overnight observation recovery.  After our discussion he would like to proceed with treating the lesion rather than surveillance.  Plan: Scheduled for elective CT-guided right renal neoplasm cryoablation at [facility] in the next few weeks.  Thank you for this interesting consult.  I greatly enjoyed meeting SYLVESTER MINTON and look forward to participating in their care.  A copy of this report was sent to the requesting provider on this date.  Electronically Signed: Greggory Keen 04/02/2022, 3:21 PM   I spent a total of  60 Minutes   in face to face in clinical consultation, greater than 50% of which was counseling/coordinating care for this patient with a right renal neoplasm by imaging.

## 2022-04-03 ENCOUNTER — Encounter: Payer: Self-pay | Admitting: *Deleted

## 2022-04-09 ENCOUNTER — Other Ambulatory Visit (HOSPITAL_COMMUNITY): Payer: Self-pay | Admitting: Interventional Radiology

## 2022-04-09 DIAGNOSIS — K861 Other chronic pancreatitis: Secondary | ICD-10-CM | POA: Diagnosis not present

## 2022-04-09 DIAGNOSIS — K219 Gastro-esophageal reflux disease without esophagitis: Secondary | ICD-10-CM | POA: Diagnosis not present

## 2022-04-09 DIAGNOSIS — R1013 Epigastric pain: Secondary | ICD-10-CM | POA: Diagnosis not present

## 2022-04-09 DIAGNOSIS — N2889 Other specified disorders of kidney and ureter: Secondary | ICD-10-CM

## 2022-04-09 DIAGNOSIS — K8689 Other specified diseases of pancreas: Secondary | ICD-10-CM | POA: Diagnosis not present

## 2022-04-09 DIAGNOSIS — R935 Abnormal findings on diagnostic imaging of other abdominal regions, including retroperitoneum: Secondary | ICD-10-CM | POA: Diagnosis not present

## 2022-04-20 DIAGNOSIS — H0102B Squamous blepharitis left eye, upper and lower eyelids: Secondary | ICD-10-CM | POA: Diagnosis not present

## 2022-04-20 DIAGNOSIS — H43813 Vitreous degeneration, bilateral: Secondary | ICD-10-CM | POA: Diagnosis not present

## 2022-04-20 DIAGNOSIS — Z961 Presence of intraocular lens: Secondary | ICD-10-CM | POA: Diagnosis not present

## 2022-04-20 DIAGNOSIS — H0102A Squamous blepharitis right eye, upper and lower eyelids: Secondary | ICD-10-CM | POA: Diagnosis not present

## 2022-04-20 DIAGNOSIS — E119 Type 2 diabetes mellitus without complications: Secondary | ICD-10-CM | POA: Diagnosis not present

## 2022-05-07 NOTE — Progress Notes (Signed)
Left message on IR PA line requesting preop orders for surgery on 05/20/2022 and preop on 05/14/2022

## 2022-05-08 ENCOUNTER — Other Ambulatory Visit: Payer: Self-pay | Admitting: Radiology

## 2022-05-08 DIAGNOSIS — N2889 Other specified disorders of kidney and ureter: Secondary | ICD-10-CM

## 2022-05-13 NOTE — Patient Instructions (Addendum)
DUE TO COVID-19 ONLY TWO VISITORS  (aged 80 and older)  ARE ALLOWED TO COME WITH YOU AND STAY IN THE WAITING ROOM ONLY DURING PRE OP AND PROCEDURE.   **NO VISITORS ARE ALLOWED IN THE SHORT STAY AREA OR RECOVERY ROOM!!**  IF YOU WILL BE ADMITTED INTO THE HOSPITAL YOU ARE ALLOWED ONLY FOUR SUPPORT PEOPLE DURING VISITATION HOURS ONLY (7 AM -8PM)   The support person(s) must pass our screening, gel in and out, and wear a mask at all times, including in the patient's room. Patients must also wear a mask when staff or their support person are in the room. Visitors GUEST BADGE MUST BE WORN VISIBLY  One adult visitor may remain with you overnight and MUST be in the room by 8 P.M.     Your procedure is scheduled on: 05/20/22   Report to [facility] Main Entrance    Report to admitting at : 6:30 AM   Call this number if you have problems the morning of surgery [phone]   Do not eat food :After Midnight.   After Midnight you may have the following liquids until : 5:30 AM DAY OF SURGERY  Water Black Coffee (sugar ok, NO MILK/CREAM OR CREAMERS)  Tea (sugar ok, NO MILK/CREAM OR CREAMERS) regular and decaf                             Plain Jell-O (NO RED)                                           Fruit ices (not with fruit pulp, NO RED)                                     Popsicles (NO RED)                                                                  Juice: apple, WHITE grape, WHITE cranberry Sports drinks like Gatorade (NO RED) Clear broth(vegetable,chicken,beef)   Oral Hygiene is also important to reduce your risk of infection.                                    Remember - BRUSH YOUR TEETH THE MORNING OF SURGERY WITH YOUR REGULAR TOOTHPASTE   Do NOT smoke after Midnight   Take these medicines the morning of surgery with A SIP OF WATER:loratadine,felodipine.  How to Manage Your Diabetes Before and After Surgery  Why is it important to control my blood sugar before and after  surgery? Improving blood sugar levels before and after surgery helps healing and can limit problems. A way of improving blood sugar control is eating a healthy diet by:  Eating less sugar and carbohydrates  Increasing activity/exercise  Talking with your doctor about reaching your blood sugar goals High blood sugars (greater than 180 mg/dL) can raise your risk of infections and slow your recovery, so you will need to focus on  controlling your diabetes during the weeks before surgery. Make sure that the doctor who takes care of your diabetes knows about your planned surgery including the date and location.  How do I manage my blood sugar before surgery? Check your blood sugar at least 4 times a day, starting 2 days before surgery, to make sure that the level is not too high or low. Check your blood sugar the morning of your surgery when you wake up and every 2 hours until you get to the Short Stay unit. If your blood sugar is less than 70 mg/dL, you will need to treat for low blood sugar: Do not take insulin. Treat a low blood sugar (less than 70 mg/dL) with  cup of clear juice (cranberry or apple), 4 glucose tablets, OR glucose gel. Recheck blood sugar in 15 minutes after treatment (to make sure it is greater than 70 mg/dL). If your blood sugar is not greater than 70 mg/dL on recheck, call [phone] for further instructions. Report your blood sugar to the short stay nurse when you get to Short Stay.  If you are admitted to the hospital after surgery: Your blood sugar will be checked by the staff and you will probably be given insulin after surgery (instead of oral diabetes medicines) to make sure you have good blood sugar levels. The goal for blood sugar control after surgery is 80-180 mg/dL.   WHAT DO I DO ABOUT MY DIABETES MEDICATION?  Do not take oral diabetes medicines (pills) the morning of surgery.  THE NIGHT BEFORE SURGERY, take metformin as usual.Take ONLY the morning dose of  glipizide. DO NOT take the evening dose of glipizide.      THE MORNING OF SURGERY, DO NOT TAKE ANY ORAL DIABETIC MEDICATIONS DAY OF YOUR SURGERY  Bring CPAP mask and tubing day of surgery.                              You may not have any metal on your body including hair pins, jewelry, and body piercing             Do not wear lotions, powders, perfumes/cologne, or deodorant              Men may shave face and neck.   Do not bring valuables to the hospital. [place].   Contacts, dentures or bridgework may not be worn into surgery.   Bring small overnight bag day of surgery.   DO NOT [place]. PHARMACY WILL DISPENSE MEDICATIONS LISTED ON YOUR MEDICATION LIST TO YOU DURING YOUR ADMISSION [place]!    Patients discharged on the day of surgery will not be allowed to drive home.  Someone NEEDS to stay with you for the first 24 hours after anesthesia.   Special Instructions: Bring a copy of your healthcare power of attorney and living will documents         the day of surgery if you haven't scanned them before.              Please read over the following fact sheets you were given: IF YOU HAVE QUESTIONS ABOUT YOUR PRE-OP INSTRUCTIONS PLEASE CALL [phone]     [facility] - Preparing for Surgery Before surgery, you can play an important role.  Because skin is not  sterile, your skin needs to be as free of germs as possible.  You can reduce the number of germs on your skin by washing with CHG (chlorahexidine gluconate) soap before surgery.  CHG is an antiseptic cleaner which kills germs and bonds with the skin to continue killing germs even after washing. Please DO NOT use if you have an allergy to CHG or antibacterial soaps.  If your skin becomes reddened/irritated stop using the CHG and inform your nurse when you arrive at Short Stay. Do not shave (including legs and underarms) for at least 48  hours prior to the first CHG shower.  You may shave your face/neck. Please follow these instructions carefully:  1.  Shower with CHG Soap the night before surgery and the  morning of Surgery.  2.  If you choose to wash your hair, wash your hair first as usual with your  normal  shampoo.  3.  After you shampoo, rinse your hair and body thoroughly to remove the  shampoo.                           4.  Use CHG as you would any other liquid soap.  You can apply chg directly  to the skin and wash                       Gently with a scrungie or clean washcloth.  5.  Apply the CHG Soap to your body ONLY FROM THE NECK DOWN.   Do not use on face/ open                           Wound or open sores. Avoid contact with eyes, ears mouth and genitals (private parts).                       Wash face,  Genitals (private parts) with your normal soap.             6.  Wash thoroughly, paying special attention to the area where your surgery  will be performed.  7.  Thoroughly rinse your body with warm water from the neck down.  8.  DO NOT shower/wash with your normal soap after using and rinsing off  the CHG Soap.                9.  Pat yourself dry with a clean towel.            10.  Wear clean pajamas.            11.  Place clean sheets on your bed the night of your first shower and do not  sleep with pets. Day of Surgery : Do not apply any lotions/deodorants the morning of surgery.  Please wear clean clothes to the hospital/surgery center.  FAILURE TO FOLLOW THESE INSTRUCTIONS MAY RESULT IN THE CANCELLATION OF YOUR SURGERY PATIENT SIGNATURE_________________________________  NURSE SIGNATURE__________________________________  ________________________________________________________________________

## 2022-05-14 ENCOUNTER — Encounter (HOSPITAL_COMMUNITY)
Admission: RE | Admit: 2022-05-14 | Discharge: 2022-05-14 | Disposition: A | Discharge status: Home / Self Care | Payer: Medicare PPO | Source: Ambulatory Visit | Attending: Interventional Radiology | Admitting: Interventional Radiology

## 2022-05-14 ENCOUNTER — Ambulatory Visit (HOSPITAL_COMMUNITY)
Admission: RE | Admit: 2022-05-14 | Discharge: 2022-05-14 | Disposition: A | Discharge status: Home / Self Care | Payer: Medicare PPO | Source: Ambulatory Visit | Attending: Radiology | Admitting: Radiology

## 2022-05-14 ENCOUNTER — Other Ambulatory Visit: Payer: Self-pay

## 2022-05-14 ENCOUNTER — Encounter (HOSPITAL_COMMUNITY): Payer: Self-pay

## 2022-05-14 VITALS — BP 132/70 | HR 69 | Temp 97.8°F | Ht 66.0 in | Wt 189.0 lb

## 2022-05-14 DIAGNOSIS — N2889 Other specified disorders of kidney and ureter: Secondary | ICD-10-CM | POA: Diagnosis not present

## 2022-05-14 DIAGNOSIS — Z01818 Encounter for other preprocedural examination: Secondary | ICD-10-CM | POA: Diagnosis not present

## 2022-05-14 DIAGNOSIS — I10 Essential (primary) hypertension: Secondary | ICD-10-CM | POA: Insufficient documentation

## 2022-05-14 DIAGNOSIS — Z0389 Encounter for observation for other suspected diseases and conditions ruled out: Secondary | ICD-10-CM | POA: Diagnosis not present

## 2022-05-14 DIAGNOSIS — E119 Type 2 diabetes mellitus without complications: Secondary | ICD-10-CM | POA: Diagnosis not present

## 2022-05-14 HISTORY — DX: Pneumonia, unspecified organism: J18.9

## 2022-05-14 HISTORY — DX: Malignant (primary) neoplasm, unspecified: C80.1

## 2022-05-14 HISTORY — DX: Unspecified osteoarthritis, unspecified site: M19.90

## 2022-05-14 LAB — HEMOGLOBIN A1C
Hgb A1c MFr Bld: 6.6 % — ABNORMAL HIGH (ref 4.8–5.6)
Mean Plasma Glucose: 142.72 mg/dL

## 2022-05-14 LAB — BASIC METABOLIC PANEL
Anion gap: 5 (ref 5–15)
BUN: 21 mg/dL (ref 8–23)
CO2: 27 mmol/L (ref 22–32)
Calcium: 9.4 mg/dL (ref 8.9–10.3)
Chloride: 108 mmol/L (ref 98–111)
Creatinine, Ser: 0.8 mg/dL (ref 0.61–1.24)
GFR, Estimated: >60 mL/min (ref >60)
Glucose, Bld: 163 mg/dL — ABNORMAL HIGH (ref 70–99)
Potassium: 4.4 mmol/L (ref 3.5–5.1)
Sodium: 140 mmol/L (ref 135–145)

## 2022-05-14 LAB — CBC WITH DIFFERENTIAL/PLATELET
Abs Immature Granulocytes: 0.02 10*3/uL (ref 0.00–0.07)
Basophils Absolute: 0.1 10*3/uL (ref 0.0–0.1)
Basophils Relative: 1 %
Eosinophils Absolute: 0.3 10*3/uL (ref 0.0–0.5)
Eosinophils Relative: 4 %
HCT: 41.4 % (ref 39.0–52.0)
Hemoglobin: 13.3 g/dL (ref 13.0–17.0)
Immature Granulocytes: 0 %
Lymphocytes Relative: 19 %
Lymphs Abs: 1.3 10*3/uL (ref 0.7–4.0)
MCH: 29.1 pg (ref 26.0–34.0)
MCHC: 32.1 g/dL (ref 30.0–36.0)
MCV: 90.6 fL (ref 80.0–100.0)
Monocytes Absolute: 0.5 10*3/uL (ref 0.1–1.0)
Monocytes Relative: 7 %
Neutro Abs: 4.9 10*3/uL (ref 1.7–7.7)
Neutrophils Relative %: 69 %
Platelets: 245 10*3/uL (ref 150–400)
RBC: 4.57 MIL/uL (ref 4.22–5.81)
RDW: 14.2 % (ref 11.5–15.5)
WBC: 7 10*3/uL (ref 4.0–10.5)
nRBC: 0 % (ref 0.0–0.2)

## 2022-05-14 LAB — PROTIME-INR
INR: 1 (ref 0.8–1.2)
Prothrombin Time: 13 seconds (ref 11.4–15.2)

## 2022-05-14 LAB — GLUCOSE, CAPILLARY: Glucose-Capillary: 197 mg/dL — ABNORMAL HIGH (ref 70–99)

## 2022-05-14 NOTE — Progress Notes (Signed)
For Short Stay: [place] appointment date: Date of COVID positive in last 27 days: NO  Bowel Prep reminder:   For Anesthesia: PCP - Dr. Alysia Penna Cardiologist - N/A  Chest x-ray -  EKG -  Stress Test -  ECHO -  Cardiac Cath -  Pacemaker/ICD device last checked: Pacemaker orders received: Device Rep notified:  Spinal Cord Stimulator:  Sleep Study -  CPAP -   Fasting Blood Sugar - 100's Checks Blood Sugar: continuous CBG monitor: RUA Date and result of last Hgb A1c- 7.6: 01/10/21  Blood Thinner Instructions: Aspirin Instructions: Last Dose:  Activity level: Can go up a flight of stairs and activities of daily living without stopping and without chest pain and/or shortness of breath   Able to exercise without chest pain and/or shortness of breath   Unable to go up a flight of stairs without chest pain and/or shortness of breath     Anesthesia review: Hx: HTN,DIA 2.  Patient denies shortness of breath, fever, cough and chest pain at PAT appointment   Patient verbalized understanding of instructions that were given to them at the PAT appointment. Patient was also instructed that they will need to review over the PAT instructions again at home before surgery.

## 2022-05-19 ENCOUNTER — Other Ambulatory Visit: Payer: Self-pay | Admitting: Radiology

## 2022-05-19 NOTE — Anesthesia Preprocedure Evaluation (Signed)
Anesthesia Evaluation  Patient identified by MRN, date of birth, ID band Patient awake    Reviewed: Allergy & Precautions, NPO status , Patient's Chart, lab work & pertinent test results  Airway Mallampati: III  TM Distance: >3 FB Neck ROM: Full    Dental  (+) Teeth Intact, Dental Advisory Given   Pulmonary neg pulmonary ROS,    Pulmonary exam normal breath sounds clear to auscultation       Cardiovascular hypertension, Pt. on medications (-) angina(-) Past MI Normal cardiovascular exam Rhythm:Regular Rate:Normal     Neuro/Psych PSYCHIATRIC DISORDERS Anxiety negative neurological ROS     GI/Hepatic Neg liver ROS, GERD  ,  Endo/Other  diabetes, Type 2, Oral Hypoglycemic AgentsObesity   Renal/GU RIGHT RENAL MASS     Musculoskeletal  (+) Arthritis ,   Abdominal   Peds  Hematology negative hematology ROS (+)   Anesthesia Other Findings   Reproductive/Obstetrics                            Anesthesia Physical Anesthesia Plan  ASA: 2  Anesthesia Plan: General   Post-op Pain Management: Tylenol PO (pre-op)*   Induction: Intravenous  PONV Risk Score and Plan: 2 and Dexamethasone and Ondansetron  Airway Management Planned: Oral ETT  Additional Equipment:   Intra-op Plan:   Post-operative Plan: Extubation in OR  Informed Consent: I have reviewed the patients History and Physical, chart, labs and discussed the procedure including the risks, benefits and alternatives for the proposed anesthesia with the patient or authorized representative who has indicated his/her understanding and acceptance.     Dental advisory given  Plan Discussed with: CRNA  Anesthesia Plan Comments:        Anesthesia Quick Evaluation

## 2022-05-20 ENCOUNTER — Encounter (HOSPITAL_COMMUNITY): Payer: Self-pay | Admitting: Interventional Radiology

## 2022-05-20 ENCOUNTER — Observation Stay (HOSPITAL_COMMUNITY)
Admission: RE | Admit: 2022-05-20 | Discharge: 2022-05-20 | Disposition: A | Discharge status: Home / Self Care | Payer: Medicare PPO | Attending: Interventional Radiology | Admitting: Interventional Radiology

## 2022-05-20 ENCOUNTER — Encounter (HOSPITAL_COMMUNITY)
Admission: RE | Disposition: A | Discharge status: Home / Self Care | Payer: Self-pay | Source: Home / Self Care | Attending: Interventional Radiology

## 2022-05-20 ENCOUNTER — Observation Stay (HOSPITAL_COMMUNITY)
Admission: RE | Admit: 2022-05-20 | Discharge: 2022-05-20 | Disposition: A | Discharge status: Home / Self Care | Payer: Medicare PPO | Source: Ambulatory Visit | Attending: Interventional Radiology | Admitting: Interventional Radiology

## 2022-05-20 ENCOUNTER — Other Ambulatory Visit: Payer: Self-pay

## 2022-05-20 ENCOUNTER — Encounter (HOSPITAL_COMMUNITY): Payer: Self-pay

## 2022-05-20 ENCOUNTER — Ambulatory Visit (HOSPITAL_COMMUNITY): Payer: Medicare PPO | Admitting: Anesthesiology

## 2022-05-20 ENCOUNTER — Ambulatory Visit (HOSPITAL_BASED_OUTPATIENT_CLINIC_OR_DEPARTMENT_OTHER): Payer: Medicare PPO | Admitting: Anesthesiology

## 2022-05-20 DIAGNOSIS — N2889 Other specified disorders of kidney and ureter: Secondary | ICD-10-CM | POA: Diagnosis not present

## 2022-05-20 DIAGNOSIS — I10 Essential (primary) hypertension: Secondary | ICD-10-CM | POA: Insufficient documentation

## 2022-05-20 DIAGNOSIS — E119 Type 2 diabetes mellitus without complications: Secondary | ICD-10-CM | POA: Diagnosis not present

## 2022-05-20 DIAGNOSIS — C641 Malignant neoplasm of right kidney, except renal pelvis: Principal | ICD-10-CM | POA: Insufficient documentation

## 2022-05-20 DIAGNOSIS — Z7984 Long term (current) use of oral hypoglycemic drugs: Secondary | ICD-10-CM | POA: Diagnosis not present

## 2022-05-20 DIAGNOSIS — C801 Malignant (primary) neoplasm, unspecified: Secondary | ICD-10-CM

## 2022-05-20 HISTORY — PX: RADIOFREQUENCY ABLATION: SHX2290

## 2022-05-20 HISTORY — DX: Malignant (primary) neoplasm, unspecified: C80.1

## 2022-05-20 LAB — ABO/RH: ABO/RH(D): B POS

## 2022-05-20 LAB — TYPE AND SCREEN
ABO/RH(D): B POS
Antibody Screen: NEGATIVE

## 2022-05-20 LAB — GLUCOSE, CAPILLARY
Glucose-Capillary: 155 mg/dL — ABNORMAL HIGH (ref 70–99)
Glucose-Capillary: 172 mg/dL — ABNORMAL HIGH (ref 70–99)

## 2022-05-20 SURGERY — RADIO FREQUENCY ABLATION
Anesthesia: General | Laterality: Right

## 2022-05-20 MED ORDER — LACTATED RINGERS IV SOLN: INTRAVENOUS | Status: DC

## 2022-05-20 MED ORDER — ONDANSETRON HCL 4 MG/2ML IJ SOLN: 4.0000 mg | Freq: Once | INTRAMUSCULAR | Status: DC | PRN

## 2022-05-20 MED ORDER — FENTANYL CITRATE PF 50 MCG/ML IJ SOSY
25.0000 ug | PREFILLED_SYRINGE | INTRAMUSCULAR | Status: DC | PRN
  Administered 2022-05-20: 50 ug via INTRAVENOUS

## 2022-05-20 MED ORDER — CHLORHEXIDINE GLUCONATE 0.12% ORAL RINSE (MEDLINE KIT)
15.0000 mL | Freq: Once | OROMUCOSAL | Status: AC
  Administered 2022-05-20: 15 mL via OROMUCOSAL

## 2022-05-20 MED ORDER — SODIUM CHLORIDE 0.9 % IV SOLN
INTRAVENOUS | Status: AC
  Filled 2022-05-20: qty 250

## 2022-05-20 MED ORDER — ROCURONIUM BROMIDE 10 MG/ML (PF) SYRINGE
PREFILLED_SYRINGE | INTRAVENOUS | Status: DC | PRN
  Administered 2022-05-20: 60 mg via INTRAVENOUS

## 2022-05-20 MED ORDER — HYDROCHLOROTHIAZIDE 25 MG PO TABS: 25.0000 mg | ORAL_TABLET | Freq: Every day | ORAL | Status: DC

## 2022-05-20 MED ORDER — PHENYLEPHRINE HCL-NACL 20-0.9 MG/250ML-% IV SOLN
INTRAVENOUS | Status: AC
  Filled 2022-05-20: qty 500

## 2022-05-20 MED ORDER — BENAZEPRIL-HYDROCHLOROTHIAZIDE 20-25 MG PO TABS
1.0000 | ORAL_TABLET | Freq: Every day | ORAL | Status: DC
Start: 2022-05-20 — End: 2022-05-20

## 2022-05-20 MED ORDER — PHENYLEPHRINE HCL-NACL 20-0.9 MG/250ML-% IV SOLN
INTRAVENOUS | Status: DC | PRN
  Administered 2022-05-20: 40 ug/min via INTRAVENOUS

## 2022-05-20 MED ORDER — ORAL CARE MOUTH RINSE: 15.0000 mL | Freq: Once | OROMUCOSAL | Status: AC

## 2022-05-20 MED ORDER — PROPOFOL 10 MG/ML IV BOLUS
INTRAVENOUS | Status: DC | PRN
  Administered 2022-05-20: 140 mg via INTRAVENOUS

## 2022-05-20 MED ORDER — ACETAMINOPHEN 500 MG PO TABS
1000.0000 mg | ORAL_TABLET | Freq: Once | ORAL | Status: AC
  Administered 2022-05-20: 1000 mg via ORAL
  Filled 2022-05-20: qty 2

## 2022-05-20 MED ORDER — DEXAMETHASONE SODIUM PHOSPHATE 10 MG/ML IJ SOLN
INTRAMUSCULAR | Status: DC | PRN
  Administered 2022-05-20: 4 mg via INTRAVENOUS

## 2022-05-20 MED ORDER — BENAZEPRIL HCL 10 MG PO TABS: 20.0000 mg | ORAL_TABLET | Freq: Every day | ORAL | Status: DC

## 2022-05-20 MED ORDER — FENTANYL CITRATE (PF) 250 MCG/5ML IJ SOLN
INTRAMUSCULAR | Status: AC
  Filled 2022-05-20: qty 5

## 2022-05-20 MED ORDER — EPHEDRINE SULFATE-NACL 50-0.9 MG/10ML-% IV SOSY
PREFILLED_SYRINGE | INTRAVENOUS | Status: DC | PRN
  Administered 2022-05-20 (×2): 5 mg via INTRAVENOUS

## 2022-05-20 MED ORDER — LIDOCAINE 2% (20 MG/ML) 5 ML SYRINGE
INTRAMUSCULAR | Status: DC | PRN
  Administered 2022-05-20: 60 mg via INTRAVENOUS

## 2022-05-20 MED ORDER — FENTANYL CITRATE PF 50 MCG/ML IJ SOSY
PREFILLED_SYRINGE | INTRAMUSCULAR | Status: AC
  Filled 2022-05-20: qty 1

## 2022-05-20 MED ORDER — SODIUM CHLORIDE (PF) 0.9 % IJ SOLN
INTRAMUSCULAR | Status: AC
  Filled 2022-05-20: qty 50

## 2022-05-20 MED ORDER — FAMOTIDINE-CA CARB-MAG HYDROX 10-800-165 MG PO CHEW: 1.0000 | CHEWABLE_TABLET | Freq: Every day | ORAL | Status: DC

## 2022-05-20 MED ORDER — FAMOTIDINE 20 MG PO TABS: 20.0000 mg | ORAL_TABLET | Freq: Every day | ORAL | Status: DC

## 2022-05-20 MED ORDER — FENTANYL CITRATE (PF) 100 MCG/2ML IJ SOLN
INTRAMUSCULAR | Status: DC | PRN
Start: 2022-05-20 — End: 2022-05-20
  Administered 2022-05-20: 50 ug via INTRAVENOUS

## 2022-05-20 MED ORDER — ONDANSETRON HCL 4 MG/2ML IJ SOLN
INTRAMUSCULAR | Status: DC | PRN
  Administered 2022-05-20: 4 mg via INTRAVENOUS

## 2022-05-20 MED ORDER — DOCUSATE SODIUM 100 MG PO CAPS: 100.0000 mg | ORAL_CAPSULE | Freq: Two times a day (BID) | ORAL | Status: DC

## 2022-05-20 MED ORDER — HYDROCODONE-ACETAMINOPHEN 5-325 MG PO TABS: 1.0000 | ORAL_TABLET | ORAL | Status: DC | PRN

## 2022-05-20 MED ORDER — SUGAMMADEX SODIUM 200 MG/2ML IV SOLN
INTRAVENOUS | Status: DC | PRN
  Administered 2022-05-20 (×2): 200 mg via INTRAVENOUS

## 2022-05-20 MED ORDER — HYDROCODONE-ACETAMINOPHEN 5-325 MG PO TABS: 1.0000 | ORAL_TABLET | Freq: Four times a day (QID) | ORAL | 0 refills | Status: DC | PRN

## 2022-05-20 MED ORDER — FELODIPINE ER 5 MG PO TB24
5.0000 mg | ORAL_TABLET | Freq: Every day | ORAL | Status: DC
Start: 2022-05-21 — End: 2022-05-20

## 2022-05-20 MED ORDER — IOHEXOL 300 MG/ML  SOLN
100.0000 mL | Freq: Once | INTRAMUSCULAR | Status: AC | PRN
  Administered 2022-05-20: 100 mL via INTRAVENOUS

## 2022-05-20 MED ORDER — ONDANSETRON HCL 4 MG/2ML IJ SOLN: 4.0000 mg | Freq: Four times a day (QID) | INTRAMUSCULAR | Status: DC | PRN

## 2022-05-20 NOTE — Progress Notes (Signed)
Foley cath removed, patient tolerated well. Patient was able to tolerate 1 [place] sandwich, 1 cup of chicken broth, saltine crackers, and iced tea.

## 2022-05-20 NOTE — Anesthesia Procedure Notes (Signed)
Procedure Name: Intubation Date/Time: 05/20/2022 8:45 AM  Performed by: Milford Cage, CRNAPre-anesthesia Checklist: Patient identified, Emergency Drugs available, Suction available and Patient being monitored Patient Re-evaluated:Patient Re-evaluated prior to induction Oxygen Delivery Method: Circle system utilized Preoxygenation: Pre-oxygenation with 100% oxygen Induction Type: IV induction Ventilation: Mask ventilation without difficulty Laryngoscope Size: Miller and 2 Grade View: Grade II Tube type: Oral Tube size: 7.5 mm Number of attempts: 1 Airway Equipment and Method: Stylet and Oral airway Placement Confirmation: ETT inserted through vocal cords under direct vision, positive ETCO2 and breath sounds checked- equal and bilateral Secured at: 21 cm Tube secured with: Tape Dental Injury: Teeth and Oropharynx as per pre-operative assessment

## 2022-05-20 NOTE — Discharge Summary (Signed)
Patient ID: Walter Campbell MRN: [id number] DOB/AGE: 1941-08-16 81 y.o.  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 Cristina Gong, adenmatous polyps, repeat in 3 years    IR RADIOLOGIST EVAL & MGMT  04/02/2022   KNEE SURGERY  1990s, 2015, 2016   Torn meniscus L scope 2016, R 2015, L 1991 scope   SALIVARY GLAND SURGERY     as baby- removal- states tumor   TONSILLECTOMY       Discharged Condition: good  Hospital Course: Patient familiar to IR service from consultation with Dr. Annamaria Boots on 04/02/2022 to discuss treatment options for a 2.2 cm complex cystic lesion within the lateral midpole portion of right kidney with an inferior nodular component suspicious for cystic renal neoplasm. Patient underwent right renal cryoablation procedure on 05/20/22. Patient requested same-day discharge if possibly d/t needing to help take care of his wife at home. Post-procedure, patient was tolerating food, reporting no flank pain, no N/V, and was able to urinate once Foley catheter was removed, no hematuria. He was deemed stable for discharge. Prescription for hydrocodone was sent electronically to patient's pharmacy, #10 with no refills. Dr Annamaria Boots will call patient to check status in 4 weeks. Follow-up imaging will be performed in 4 months. Patient was told to contact our service with any additional questions or concerns.   Consults:  Anesthesia  Significant Diagnostic Studies:  Results for orders placed or performed during  the hospital encounter of 05/20/22  Glucose, capillary  Result Value Ref Range   Glucose-Capillary 155 (H) 70 - 99 mg/dL   Comment 1 Notify RN    Comment 2 Document in Chart   Glucose, capillary  Result Value Ref Range   Glucose-Capillary 172 (H) 70 - 99 mg/dL  ABO/Rh  Result Value Ref Range   ABO/RH(D)      B POS Performed at [facility] [address], [place], [place] [address]      Recent Results (from the past 2160 hour(s))  Glucose, capillary     Status: Abnormal   Collection Time: 05/14/22 10:25 AM  Result Value Ref Range   Glucose-Capillary 197 (H) 70 - 99 mg/dL    Comment: Glucose reference range applies only to samples taken after fasting for at least 8 hours.  CBC with Differential/Platelet     Status: None   Collection Time: 05/14/22 10:46 AM  Result Value Ref Range   WBC 7.0 4.0 - 10.5 K/uL   RBC 4.57 4.22 - 5.81 MIL/uL   Hemoglobin 13.3 13.0 - 17.0 g/dL   HCT 41.4 39.0 - 52.0 %   MCV 90.6 80.0 - 100.0 fL   MCH 29.1 26.0 - 34.0 pg   MCHC 32.1 30.0 - 36.0 g/dL   RDW 14.2 11.5 - 15.5 %   Platelets 245 150 - 400 K/uL   nRBC 0.0 0.0 - 0.2 %   Neutrophils Relative % 69 %   Neutro Abs 4.9 1.7 - 7.7 K/uL  Lymphocytes Relative 19 %   Lymphs Abs 1.3 0.7 - 4.0 K/uL   Monocytes Relative 7 %   Monocytes Absolute 0.5 0.1 - 1.0 K/uL   Eosinophils Relative 4 %   Eosinophils Absolute 0.3 0.0 - 0.5 K/uL   Basophils Relative 1 %   Basophils Absolute 0.1 0.0 - 0.1 K/uL   Immature Granulocytes 0 %   Abs Immature Granulocytes 0.02 0.00 - 0.07 K/uL    Comment: Performed at [facility], [place] [address] [place], [place] [address]  Protime-INR     Status: None   Collection Time: 05/14/22 10:46 AM  Result Value Ref Range   Prothrombin Time 13.0 11.4 - 15.2 seconds   INR 1.0 0.8 - 1.2    Comment: (NOTE) INR goal varies based on device and disease states. Performed at [facility], [place] [address], [place],  [place] [address]   Basic metabolic panel     Status: Abnormal   Collection Time: 05/14/22 10:46 AM  Result Value Ref Range   Sodium 140 135 - 145 mmol/L   Potassium 4.4 3.5 - 5.1 mmol/L   Chloride 108 98 - 111 mmol/L   CO2 27 22 - 32 mmol/L   Glucose, Bld 163 (H) 70 - 99 mg/dL    Comment: Glucose reference range applies only to samples taken after fasting for at least 8 hours.   BUN 21 8 - 23 mg/dL   Creatinine, Ser 0.80 0.61 - 1.24 mg/dL   Calcium 9.4 8.9 - 10.3 mg/dL   GFR, Estimated >60 >60 mL/min    Comment: (NOTE) Calculated using the CKD-EPI Creatinine Equation (2021)    Anion gap 5 5 - 15    Comment: Performed at [facility], [place] [address] [place], [place] [address]  Type and screen     Status: None   Collection Time: 05/14/22 10:46 AM  Result Value Ref Range   ABO/RH(D) B POS    Antibody Screen NEG    Sample Expiration 05/23/2022,2359    Extend sample reason      NO TRANSFUSIONS OR PREGNANCY IN THE PAST 3 MONTHS Performed at [facility], [place] [address], [place], [place] [address]   Hemoglobin A1c per protocol     Status: Abnormal   Collection Time: 05/14/22 10:46 AM  Result Value Ref Range   Hgb A1c MFr Bld 6.6 (H) 4.8 - 5.6 %    Comment: (NOTE) Pre diabetes:          5.7%-6.4%  Diabetes:              >6.4%  Glycemic control for   <7.0% adults with diabetes    Mean Plasma Glucose 142.72 mg/dL    Comment: Performed at [facility] [address] [place], [place] [address]  Glucose, capillary     Status: Abnormal   Collection Time: 05/20/22  7:11 AM  Result Value Ref Range   Glucose-Capillary 155 (H) 70 - 99 mg/dL    Comment: Glucose reference range applies only to samples taken after fasting for at least 8 hours.   Comment 1 Notify RN    Comment 2 Document in Chart   ABO/Rh     Status: None   Collection Time: 05/20/22  7:30 AM  Result Value Ref Range   ABO/RH(D)      B POS Performed at [facility], [place] [address] [place], [place] [address]   Glucose, capillary  Status: Abnormal   Collection Time: 05/20/22 11:07 AM  Result Value Ref Range   Glucose-Capillary 172 (H) 70 - 99 mg/dL    Comment: Glucose reference range applies only to samples taken after fasting for at least 8 hours.     Treatments: procedures: Right renal cryoablation for right renal mass Discharge Exam: Blood pressure 107/65, pulse 72, temperature 98.1 F (36.7 C), temperature source Oral, resp. rate 18, height '5\' 6"'$  (1.676 m), weight 188 lb 15 oz (85.7 kg), SpO2 93 %. Pulm: Lungs CTA bilaterally CV: Normal heart sounds, no murmurs, rub, gallop, pulses normal Abdominal: Pt reports no N/V; no abdominal tenderness noted Puncture site on right flank was clean/dry, no hematoma. Not significantly tender No lower extremity edema  Disposition: Discharge disposition: 01-Home or Self Care       Discharge Instructions     Call MD for:  difficulty breathing, headache or visual disturbances   Complete by: As directed    Call MD for:  extreme fatigue   Complete by: As directed    Call MD for:  hives   Complete by: As directed    Call MD for:  persistant dizziness or light-headedness   Complete by: As directed    Call MD for:  persistant nausea and vomiting   Complete by: As directed    Call MD for:  redness, tenderness, or signs of infection (pain, swelling, redness, odor or green/yellow discharge around incision site)   Complete by: As directed    Call MD for:  severe uncontrolled pain   Complete by: As directed    Call MD for:  temperature >100.4   Complete by: As directed    Change dressing (specify)   Complete by: As directed    May apply bandaid to right flank puncture site daily for next 2-3 days; may wash site with soap and water   Diet - low sodium heart healthy   Complete by: As directed    Discharge instructions   Complete by: As directed    Resume home meds; stay well hydrated; avoid  strenuous activity for one week; do not drive for 24 hours or after taking narcotic medication   Driving Restrictions   Complete by: As directed    No driving for next 24 hours or after taking narcotic medicine   Increase activity slowly   Complete by: As directed    Lifting restrictions   Complete by: As directed    Avoid heavy lifting for 1 week   May shower / Bathe   Complete by: As directed    May walk up steps   Complete by: As directed       Allergies as of 05/20/2022   No Known Allergies      Medication List     TAKE these medications    atorvastatin 20 MG tablet Commonly known as: LIPITOR Take on Mondays, Wednesdays, and Fridays   benazepril-hydrochlorthiazide 20-25 MG tablet Commonly known as: LOTENSIN HCT Take 1 tablet by mouth daily.   Creon 36000 UNITS Cpep capsule Generic drug: lipase/protease/amylase Take 1-2 capsules by mouth See admin instructions. Take 2 capsule with each meal and 1 capsule with a snack   famotidine-calcium carbonate-magnesium hydroxide 10-800-165 MG chewable tablet Commonly known as: PEPCID COMPLETE Chew 1 tablet by mouth at bedtime.   felodipine 5 MG 24 hr tablet Commonly known as: PLENDIL Take 1 tablet (5 mg total) by mouth daily.   FreeStyle Libre 2 Sensor Misc Change sensor every 14 days  glipiZIDE 5 MG tablet Commonly known as: GLUCOTROL Take 2 tablets (10 mg total) by mouth daily before breakfast AND 1.5 tablets (7.5 mg total) daily before supper. What changed: See the new instructions.   HYDROcodone-acetaminophen 5-325 MG tablet Commonly known as: NORCO/VICODIN Take 1-2 tablets by mouth every 6 (six) hours as needed for moderate pain.   ibuprofen 200 MG tablet Commonly known as: ADVIL Take 200 mg by mouth every 6 (six) hours as needed for headache or moderate pain.   loratadine 10 MG tablet Commonly known as: CLARITIN Take 10 mg by mouth daily as needed for allergies.   LUBRICATING EYE DROPS OP Place 1 drop  into both eyes daily as needed (dry eyes).   metFORMIN 500 MG 24 hr tablet Commonly known as: GLUCOPHAGE-XR Take 2 tablets (1,000 mg total) by mouth in the morning and at bedtime.   multivitamin with minerals Tabs tablet Take 1 tablet by mouth daily.   OVER THE COUNTER MEDICATION Apply 1 Application topically daily as needed (pain). CBD cream               Discharge Care Instructions  (From admission, onward)           Start     Ordered   05/20/22 0000  Change dressing (specify)       Comments: May apply bandaid to right flank puncture site daily for next 2-3 days; may wash site with soap and water   05/20/22 1629            Follow-up Information     Greggory Keen, MD Follow up.   Specialties: Interventional Radiology, Radiology Why: Dr Annamaria Boots will call you in 4 weeks to check status; call [phone] or [phone] with any questions. Contact information: [place] [address] [phone]         Ceasar Mons, MD Follow up.   Specialty: Urology Why: Follow-up with Dr Lovena Neighbours as scheduled Contact information: [address] [place] [address] [phone]                  Electronically Signed: Lura Em, [place] 05/20/2022, 4:31 PM   I have spent Less Than 30 Minutes discharging Walter Campbell.

## 2022-05-20 NOTE — Procedures (Signed)
Interventional Radiology Procedure Note  Procedure: CT AND US GUIDED RT RENAL MASS CRYO    Complications: None  Estimated Blood Loss:  MIN  Findings: SINGLE ICEFORCE 14 G CRYO ABLATION  FULL REPORT IN PACS     Tamera Punt, MD

## 2022-05-20 NOTE — Discharge Instructions (Signed)
Resume home medications; stay well hydrated; avoid strenuous activity for at least one week; do not drive for 24 hours or after taking narcotic medication.

## 2022-05-20 NOTE — Transfer of Care (Signed)
Immediate Anesthesia Transfer of Care Note  Patient: Walter Campbell  Procedure(s) Performed: RIGHT CRYOABLATION (Right)  Patient Location: PACU  Anesthesia Type:General  Level of Consciousness: awake  Airway & Oxygen Therapy: Patient Spontanous Breathing  Post-op Assessment: Report given to RN and Post -op Vital signs reviewed and stable  Post vital signs: Reviewed and stable  Last Vitals:  Vitals Value Taken Time  BP    Temp    Pulse    Resp    SpO2 92%     Last Pain:  Vitals:   05/20/22 0723  TempSrc:   PainSc: 0-No pain         Complications: No notable events documented.

## 2022-05-20 NOTE — H&P (Signed)
Referring Physician(s): [place] Physician: Daryll Brod  Patient Status:  [facility] OP  Chief Complaint: Right renal lesion   Subjective: Patient familiar to IR service from consultation with Dr. Annamaria Boots on 04/02/2022 to discuss treatment options for a 2.2 cm complex cystic lesion within the lateral midpole portion of right kidney with an inferior nodular component suspicious for cystic renal neoplasm.  He was deemed an appropriate candidate for image guided cryoablation and possible biopsy and presents today for the procedure.  He currently denies fever, headache, chest pain, dyspnea, cough, abdominal/back pain, nausea, vomiting or bleeding.  Additional medical history includes arthritis, diabetes, anxiety, GERD, hypertension, history of palpitations, prior pancreatitis.  Past Medical History:  Diagnosis Date   Arthritis    Cancer ([place])    Diabetes mellitus    Hypertension    Palpitations 06/10/2010   Pancreatitis    Pneumonia    Past Surgical History:  Procedure Laterality Date   APPENDECTOMY  1957   COLONOSCOPY  12/19/2018   per Dr. Cristina Gong, adenmatous polyps, repeat in 3 years    IR RADIOLOGIST EVAL & MGMT  04/02/2022   KNEE SURGERY  1990s, 2015, 2016   Torn meniscus L scope 2016, R 2015, L 1991 scope   SALIVARY GLAND SURGERY     as baby- removal- states tumor   TONSILLECTOMY        Allergies: Patient has no known allergies.  Medications: Prior to Admission medications   Medication Sig Start Date End Date Taking? Authorizing Provider  atorvastatin (LIPITOR) 20 MG tablet Take on Mondays, Wednesdays, and Fridays 12/01/21  Yes Laurey Morale, MD  benazepril-hydrochlorthiazide (LOTENSIN HCT) 20-25 MG tablet Take 1 tablet by mouth daily. 12/01/21  Yes Laurey Morale, MD  Carboxymethylcellul-Glycerin (LUBRICATING EYE DROPS OP) Place 1 drop into both eyes daily as needed (dry eyes).   Yes [provider]  Continuous Blood Gluc Sensor (FREESTYLE LIBRE 2  SENSOR) MISC Change sensor every 14 days 09/02/21  Yes Shamleffer, Melanie Crazier, MD  CREON 36000-114000 units CPEP capsule Take 1-2 capsules by mouth See admin instructions. Take 2 capsule with each meal and 1 capsule with a snack 02/03/22  Yes [provider]  famotidine-calcium carbonate-magnesium hydroxide (PEPCID COMPLETE) 10-800-165 MG chewable tablet Chew 1 tablet by mouth at bedtime.   Yes [provider]  felodipine (PLENDIL) 5 MG 24 hr tablet Take 1 tablet (5 mg total) by mouth daily. 12/01/21  Yes Laurey Morale, MD  glipiZIDE (GLUCOTROL) 5 MG tablet Take 2 tablets (10 mg total) by mouth daily before breakfast AND 1.5 tablets (7.5 mg total) daily before supper. Patient taking differently: Take 10 mg in the morning before breakfast and 5 mg before supper  12/17/21  Yes Shamleffer, Melanie Crazier, MD  ibuprofen (ADVIL) 200 MG tablet Take 200 mg by mouth every 6 (six) hours as needed for headache or moderate pain.   Yes [provider]  loratadine (CLARITIN) 10 MG tablet Take 10 mg by mouth daily as needed for allergies.   Yes [provider]  metFORMIN (GLUCOPHAGE-XR) 500 MG 24 hr tablet Take 2 tablets (1,000 mg total) by mouth in the morning and at bedtime. 06/11/21  Yes Shamleffer, Melanie Crazier, MD  Multiple Vitamin (MULTIVITAMIN WITH MINERALS) TABS tablet Take 1 tablet by mouth daily.   Yes [provider]  OVER THE COUNTER MEDICATION Apply 1 Application topically daily as needed (pain). CBD cream   Yes [provider]     Vital  Signs: BP 130/68   Pulse 73   Temp 98.2 F (36.8 C) (Oral)   Resp 18   Ht '5\' 6"'$  (1.676 m)   Wt 188 lb 15 oz (85.7 kg)   SpO2 98%   BMI 30.49 kg/m   Physical Exam awake, alert.  Chest clear to auscultation bilaterally.  Heart with regular rate and rhythm.  Abdomen protuberant, soft, positive bowel sounds, nontender.  No lower extremity edema.  Imaging: No results found.  Labs:  CBC: Recent Labs     12/04/21 0912 05/14/22 1046  WBC 7.2 7.0  HGB 13.4 13.3  HCT 41.0 41.4  PLT 239.0 245    COAGS: Recent Labs    05/14/22 1046  INR 1.0    BMP: Recent Labs    12/04/21 0912 05/14/22 1046  NA 140 140  K 4.0 4.4  CL 104 108  CO2 28 27  GLUCOSE 132* 163*  BUN 23 21  CALCIUM 9.0 9.4  CREATININE 0.91 0.80  GFRNONAA  --  >60    LIVER FUNCTION TESTS: Recent Labs    12/04/21 0912  BILITOT 0.6  AST 13  ALT 16  ALKPHOS 66  PROT 6.2  ALBUMIN 4.2    Assessment and Plan: Patient familiar to IR service from consultation with Dr. Annamaria Boots on 04/02/2022 to discuss treatment options for a 2.2 cm complex cystic lesion within the lateral midpole portion of right kidney with an inferior nodular component suspicious for cystic renal neoplasm.  He was deemed an appropriate candidate for image guided cryoablation and possible biopsy of the lesion and presents today for the procedure.  Details/risks of procedure, including but not limited to, internal bleeding, infection, injury to adjacent structures, anesthesia related complications discussed with patient with his understanding and consent.   Electronically Signed: D. Rowe Robert, PA-C 05/20/2022, 8:00 AM   I spent a total of 30 minutes at the the patient's bedside AND on the patient's hospital floor or unit, greater than 50% of which was counseling/coordinating care for image guided cryoablation /possible biopsy of right renal lesion

## 2022-05-21 ENCOUNTER — Encounter (HOSPITAL_COMMUNITY): Payer: Self-pay | Admitting: Interventional Radiology

## 2022-05-21 NOTE — Anesthesia Postprocedure Evaluation (Signed)
Anesthesia Post Note  Patient: HAITHAM DOLINSKY  Procedure(s) Performed: RIGHT CRYOABLATION (Right)     Patient location during evaluation: PACU Anesthesia Type: General Level of consciousness: awake and alert Pain management: pain level controlled Vital Signs Assessment: post-procedure vital signs reviewed and stable Respiratory status: spontaneous breathing, nonlabored ventilation, respiratory function stable and patient connected to nasal cannula oxygen Cardiovascular status: blood pressure returned to baseline and stable Postop Assessment: no apparent nausea or vomiting Anesthetic complications: no   No notable events documented.  Last Vitals:  Vitals:   05/20/22 1345 05/20/22 1446  BP: 110/65 107/65  Pulse: 78 72  Resp: 17 18  Temp: (!) 36.1 C 36.7 C  SpO2: 92% 93%    Last Pain:  Vitals:   05/20/22 1446  TempSrc: Oral  PainSc:                  Santa Lighter

## 2022-06-03 DIAGNOSIS — Z86010 Personal history of colonic polyps: Secondary | ICD-10-CM | POA: Diagnosis not present

## 2022-06-03 DIAGNOSIS — K861 Other chronic pancreatitis: Secondary | ICD-10-CM | POA: Diagnosis not present

## 2022-06-03 DIAGNOSIS — N2889 Other specified disorders of kidney and ureter: Secondary | ICD-10-CM | POA: Diagnosis not present

## 2022-06-15 ENCOUNTER — Ambulatory Visit
Admission: RE | Admit: 2022-06-15 | Discharge: 2022-06-15 | Disposition: A | Discharge status: Home / Self Care | Payer: Medicare PPO | Source: Ambulatory Visit | Attending: Student | Admitting: Student

## 2022-06-15 ENCOUNTER — Encounter: Payer: Self-pay | Admitting: *Deleted

## 2022-06-15 DIAGNOSIS — N2889 Other specified disorders of kidney and ureter: Secondary | ICD-10-CM | POA: Diagnosis not present

## 2022-06-15 DIAGNOSIS — Z9889 Other specified postprocedural states: Secondary | ICD-10-CM | POA: Diagnosis not present

## 2022-06-15 HISTORY — PX: IR RADIOLOGIST EVAL & MGMT: IMG5224

## 2022-06-15 NOTE — Progress Notes (Signed)
Patient ID: Walter Campbell, male   DOB: 1941/04/12, 81 y.o.   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 Lovena Neighbours  History of Present Illness: Walter Campbell is a 81 y.o. male who is now 1 month status post right renal neoplasm cryoablation performed 05/20/2022.  Procedure was performed at [facility].  He was able to be discharged same day.  Overall his recovery over the last month very well.  Some discomfort from the Foley catheter has passed.  He had minor bandlike rib pain at the treatment site where the needle was inserted however this has resolved completely.  Currently no new symptoms or pain.  No acute flank or abdominal pain.  No other urinary tract symptoms.  Stable weight and appetite.  Overall he is doing very well.  No follow-up imaging at this point.  Past Medical History:  Diagnosis Date   Arthritis    Cancer ([place])    Diabetes mellitus    Hypertension    Palpitations 06/10/2010   Pancreatitis    Pneumonia     Past Surgical History:  Procedure Laterality Date   APPENDECTOMY  1957   COLONOSCOPY  12/19/2018   per Dr. Cristina Gong, adenmatous polyps, repeat in 3 years    IR RADIOLOGIST EVAL & MGMT  04/02/2022   KNEE SURGERY  1990s, 2015, 2016   Torn meniscus L scope 2016, R 2015, L 1991 scope   RADIOFREQUENCY ABLATION Right 05/20/2022   Procedure: RIGHT CRYOABLATION;  Surgeon: Greggory Keen, MD;  Location: [facility];  Service: Anesthesiology;  Laterality: Right;   SALIVARY GLAND SURGERY     as baby- removal- states tumor   TONSILLECTOMY      Allergies: Patient has no known allergies.  Medications: Prior to Admission medications   Medication Sig Start Date End Date Taking? Authorizing Provider  atorvastatin (LIPITOR) 20 MG tablet Take on Mondays, Wednesdays, and Fridays 12/01/21   Laurey Morale, MD  benazepril-hydrochlorthiazide (LOTENSIN HCT) 20-25 MG tablet Take 1 tablet by mouth daily. 12/01/21    Laurey Morale, MD  Carboxymethylcellul-Glycerin (LUBRICATING EYE DROPS OP) Place 1 drop into both eyes daily as needed (dry eyes).    [provider]  Continuous Blood Gluc Sensor (FREESTYLE LIBRE 2 SENSOR) MISC Change sensor every 14 days 09/02/21   Shamleffer, Melanie Crazier, MD  CREON [phone] units CPEP capsule Take 1-2 capsules by mouth See admin instructions. Take 2 capsule with each meal and 1 capsule with a snack 02/03/22   [provider]  famotidine-calcium carbonate-magnesium hydroxide (PEPCID COMPLETE) 10-800-165 MG chewable tablet Chew 1 tablet by mouth at bedtime.    [provider]  felodipine (PLENDIL) 5 MG 24 hr tablet Take 1 tablet (5 mg total) by mouth daily. 12/01/21   Laurey Morale, MD  glipiZIDE (GLUCOTROL) 5 MG tablet Take 2 tablets (10 mg total) by mouth daily before breakfast AND 1.5 tablets (7.5 mg total) daily before supper. Patient taking differently: Take 10 mg in the morning before breakfast and 5 mg before supper  12/17/21   Shamleffer, Melanie Crazier, MD  HYDROcodone-acetaminophen (NORCO/VICODIN) 5-325 MG tablet Take 1-2 tablets by mouth every 6 (six) hours as needed for moderate pain. 05/20/22   Lura Em, PA  ibuprofen (ADVIL) 200 MG tablet Take 200 mg by mouth every 6 (six) hours as needed for headache or moderate pain.    [provider]  loratadine (CLARITIN) 10 MG  tablet Take 10 mg by mouth daily as needed for allergies.    [provider]  metFORMIN (GLUCOPHAGE-XR) 500 MG 24 hr tablet Take 2 tablets (1,000 mg total) by mouth in the morning and at bedtime. 06/11/21   Shamleffer, Melanie Crazier, MD  Multiple Vitamin (MULTIVITAMIN WITH MINERALS) TABS tablet Take 1 tablet by mouth daily.    [provider]  OVER THE COUNTER MEDICATION Apply 1 Application topically daily as needed (pain). CBD cream    [provider]     Family History  Problem Relation Age of Onset   Stroke Mother    Diabetes  Mother    Osteoporosis Mother    Dementia Mother    Heart disease Mother        pacemaker   Hyperlipidemia Father        diet controlled   CAD Father        stents in 73s or 67s   Arthritis Sister    Hyperthyroidism Sister     Social History   Socioeconomic History   Marital status: Married    Spouse name: Not on file   Number of children: Not on file   Years of education: Not on file   Highest education level: Not on file  Occupational History   Not on file  Tobacco Use   Smoking status: Never   Smokeless tobacco: Never  Vaping Use   Vaping Use: Never used  Substance and Sexual Activity   Alcohol use: Yes    Alcohol/week: 1.0 - 7.0 standard drink of alcohol    Types: 1 - 7 Standard drinks or equivalent per week    Comment: occas.   Drug use: Yes    Comment: cbd ointment   Sexual activity: Not on file  Other Topics Concern   Not on file  Social History Narrative   Married. 1 daughter, 2 step sons. 8 grandkids. No greatgrandkids.       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, bridge, travel, reading   Social Determinants of Radio broadcast assistant Strain: [place]  (07/08/2021)   Overall Financial Resource Strain (CARDIA)    Difficulty of Paying Living Expenses: Not hard at all  Food Insecurity: No Food Insecurity (07/08/2021)   Hunger Vital Sign    Worried About Running Out of Food in the Last Year: Never true    [place] in the Last Year: Never true  Transportation Needs: No Transportation Needs (07/08/2021)   PRAPARE - Hydrologist (Medical): No    Lack of Transportation (Non-Medical): No  Physical Activity: Insufficiently Active (07/08/2021)   Exercise Vital Sign    Days of Exercise per Week: 3 days    Minutes of Exercise per Session: 30 min  Stress: No Stress Concern Present (07/08/2021)   [place]    Feeling of Stress :  Not at all  Social Connections: Moderately Isolated (07/08/2021)   Social Connection and Isolation Panel [NHANES]    Frequency of Communication with Friends and Family: Three times a week    Frequency of Social Gatherings with Friends and Family: Three times a week    Attends Religious Services: Never    Active Member of Clubs or Organizations: No    Attends Archivist Meetings: Never    Marital Status: Married    ECOG Status: 1 - Symptomatic but completely ambulatory  Review of Systems  Review of Systems: A 12 point ROS discussed and pertinent positives are indicated in the HPI above.  All other systems are negative.    Physical Exam No direct physical exam was performed telephone health visit only today to review 1 month recovery Vital Signs: There were no vitals taken for this visit.  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 EXAM: CT-GUIDED PERCUTANEOUS CRYOABLATION OF 2.2 CM COMPLEX PARTIALLY CYSTIC RIGHT RENAL MASS ANESTHESIA/SEDATION: General MEDICATIONS: NONE. CONTRAST:  144m OMNIPAQUE IOHEXOL 300 MG/ML  SOLN PROCEDURE: The procedure, risks, benefits, and alternatives were explained to the patient. Questions regarding the procedure were encouraged and answered. The patient understands and consents to the procedure. The patient was placed under general anesthesia. Initial unenhanced CT was performed in a slightly right anterior oblique position to localize the right endophytic upper pole renal neoplasm. The patient was prepped with ChloraPrep in a sterile fashion, and a sterile drape was applied covering the operative field. A sterile gown and sterile gloves were used for the procedure. Under CT guidance, a single 14 gauge ice force percutaneous cryoablation probe was advanced into the right renal lesion. Probe positioning was confirmed by CT prior to cryoablation.  Cryoablation was performed through the single 14 gauge ice force cryo ablation needle. Initial 10 minute cycle of cryoablation was performed. This was followed by a 8 minute thaw cycle. A second 10 minute cycle of cryoablation was then performed. During ablation, periodic CT imaging was performed to monitor ice ball formation and morphology. After active thaw, the cryoablation probe was removed. Post-procedural CT was performed. COMPLICATIONS: None immediate. FINDINGS: Imaging confirms needle placement centered within the endophytic cortical partially cystic right renal lesion (2.2 cm maximal diameter). Lesion is compatible with renal neoplasm by imaging criteria. Successful cryoablation performed as above. Contrast was administered confirming adequate ice ball coverage of the lesion during surveillance imaging throughout the ablation. Following ablation, needle tract was cauterized successfully. Final imaging confirms no subcapsular perinephric or retroperitoneal hemorrhage. No hydronephrosis. No acute finding. IMPRESSION: CT guided percutaneouscryoablation of the partially cystic right renal neoplasm as above. Initial follow-up will be performed in approximately 4 weeks. Electronically Signed   By: MJerilynn Mages  Tinzley Dalia M.D.   On: 05/20/2022 11:47    Labs:  CBC: Recent Labs    12/04/21 0912 05/14/22 1046  WBC 7.2 7.0  HGB 13.4 13.3  HCT 41.0 41.4  PLT 239.0 245    COAGS: Recent Labs    05/14/22 1046  INR 1.0    BMP: Recent Labs    12/04/21 0912 05/14/22 1046  NA 140 140  K 4.0 4.4  CL 104 108  CO2 28 27  GLUCOSE 132* 163*  BUN 23 21  CALCIUM 9.0 9.4  CREATININE 0.91 0.80  GFRNONAA  --  >60    LIVER FUNCTION TESTS: Recent Labs    12/04/21 0912  BILITOT 0.6  AST 13  ALT 16  ALKPHOS 66  PROT 6.2  ALBUMIN 4.2   Assessment and Plan:  1 month status post CT-guided cryoablation of a right kidney lateral pole complex cystic lesion with nodular enhancement suspicious for cystic renal  neoplasm by imaging.  [facility].  He has recovered very well over the last month.  No current issues.  No surveillance imaging at this point.  All questions addressed.  Plan: Schedule for initial follow-up imaging with CT  without and with contrast at 3 months from now in [place].    Electronically Signed: Greggory Keen 06/15/2022, 9:58 AM   I spent a total of    25 Minutes in remote  clinical consultation, greater than 50% of which was counseling/coordinating care for this patient status post right renal cryoablation.    Visit type: Audio only (telephone). Audio (no video) only due to patient's lack of internet/smartphone capability. Alternative for in-person consultation at [facility], [address], [place], [place]. This visit type was conducted due to national recommendations for restrictions regarding the COVID-19 Pandemic (e.g. social distancing).  This format is felt to be most appropriate for this patient at this time.  All issues noted in this document were discussed and addressed.

## 2022-06-22 ENCOUNTER — Ambulatory Visit: Payer: Medicare PPO | Admitting: Internal Medicine

## 2022-06-23 ENCOUNTER — Other Ambulatory Visit: Payer: Self-pay | Admitting: Internal Medicine

## 2022-06-26 ENCOUNTER — Other Ambulatory Visit: Payer: Self-pay | Admitting: Internal Medicine

## 2022-07-06 ENCOUNTER — Encounter: Payer: Self-pay | Admitting: Family Medicine

## 2022-07-06 ENCOUNTER — Ambulatory Visit: Payer: Medicare PPO | Admitting: Family Medicine

## 2022-07-06 VITALS — BP 120/60 | HR 70 | Temp 97.8°F | Ht 66.0 in | Wt 194.4 lb

## 2022-07-06 DIAGNOSIS — M5432 Sciatica, left side: Secondary | ICD-10-CM | POA: Diagnosis not present

## 2022-07-06 NOTE — Patient Instructions (Signed)
Watch for any progressive pain, numbness, or weakness.

## 2022-07-06 NOTE — Progress Notes (Signed)
Established Patient Office Visit  Subjective   Patient ID: Walter Campbell, male    DOB: 1941/01/02  Age: 81 y.o. MRN: [id number]  Chief Complaint  Patient presents with   Flank Pain    Patient complains of left sided flank pain, x1 week, Patient tried Tylenol with little relief,    Leg Pain    Patient complains of left leg pain, x1 week,    HPI   Seen with approxi-1 week history of pain radiating from the left gluteus region down all the way to the foot.  This is a sharp pain much worse at night and mostly when he is getting up after prolonged periods of lying still.  He has managed to play golf couple times during the past week.  Denies any lower extremity numbness or weakness.  Not aware of any specific injury.  Denies any fever, chills, dysuria.  Taken some Tylenol with mild relief.  Denies any past history of significant low back difficulties.  He does have type 2 diabetes which is reasonably well controlled with recent A1c 6.6%.  Past Medical History:  Diagnosis Date   Arthritis    Cancer ([place])    Diabetes mellitus    Hypertension    Palpitations 06/10/2010   Pancreatitis    Pneumonia    Past Surgical History:  Procedure Laterality Date   APPENDECTOMY  1957   COLONOSCOPY  12/19/2018   per Dr. 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   TONSILLECTOMY      reports that he has never smoked. He has never used smokeless tobacco. He reports current alcohol use of about 1.0 - 7.0 standard drink of alcohol per week. He reports current drug use. family history includes Arthritis  in his sister; CAD in his father; Dementia in his mother; Diabetes in his mother; Heart disease in his mother; Hyperlipidemia in his father; Hyperthyroidism in his sister; Osteoporosis in his mother; Stroke in his mother. No Known Allergies  Review of Systems  Constitutional:  Negative for chills, fever and weight loss.  Genitourinary:  Negative for dysuria.  Musculoskeletal:  Positive for back pain.  Neurological:  Negative for tingling, focal weakness and weakness.      Objective:     BP 120/60 (BP Location: Left Arm, Patient Position: Sitting, Cuff Size: Normal)   Pulse 70   Temp 97.8 F (36.6 C) (Oral)   Ht '5\' 6"'$  (1.676 m)   Wt 194 lb 6.4 oz (88.2 kg)   SpO2 97%   BMI 31.38 kg/m  BP Readings from Last 3 Encounters:  07/06/22 120/60  05/20/22 107/65  05/14/22 132/70   Wt Readings from Last 3 Encounters:  07/06/22 194 lb 6.4 oz (88.2 kg)  05/20/22 188 lb 15 oz (85.7 kg)  05/14/22 189 lb (85.7 kg)      Physical Exam Vitals reviewed.  Constitutional:      Appearance: Normal appearance.  Cardiovascular:     Rate and Rhythm: Normal rate and regular rhythm.  Pulmonary:     Effort:  Pulmonary effort is normal.     Breath sounds: Normal breath sounds.  Musculoskeletal:     Comments: No lumbar tenderness.  Straight leg raise is negative bilaterally.  Neurological:     Mental Status: He is alert.     Comments: Full strength with plantarflexion dorsiflexion.  Deep tendon reflexes were difficult to elicit knee or ankle bilaterally.      No results found for any visits on 07/06/22.    The ASCVD Risk score (Arnett DK, et al., 2019) failed to calculate for the following reasons:   The valid total cholesterol range is 130 to 320 mg/dL    Assessment & Plan:   Problem List Items Addressed This Visit   None Visit Diagnoses     Left sided sciatica    -  Primary     Patient presents with over 1 week history of sciatica type symptoms on the left side.  We recommended  conservative management at this time with some extension stretches.  Avoid heavy lifting or active back flexion.  Try to avoid prednisone with his chronic diabetes.  He has some leftover hydrocodone which he will take at night for severe pain.  Avoid regular use of nonsteroidals. Follow-up immediately for any weakness, progressive numbness, or other concerns  No follow-ups on file.    Carolann Littler, MD

## 2022-07-09 ENCOUNTER — Ambulatory Visit (INDEPENDENT_AMBULATORY_CARE_PROVIDER_SITE_OTHER): Payer: Medicare PPO

## 2022-07-09 VITALS — Ht 66.0 in | Wt 194.0 lb

## 2022-07-09 DIAGNOSIS — Z0000 Encounter for general adult medical examination without abnormal findings: Secondary | ICD-10-CM

## 2022-07-09 NOTE — Progress Notes (Signed)
Subjective:   Walter Campbell is a 81 y.o. male who presents for Medicare Annual/Subsequent preventive examination.  Review of Systems    Virtual Visit via Telephone Note  I connected with  Walter Campbell on 07/09/22 at  9:45 AM EDT by telephone and verified that I am speaking with the correct person using two identifiers.  Location: Patient: Home Provider: Office Persons participating in the virtual visit: patient/Nurse Health Advisor   I discussed the limitations, risks, security and privacy concerns of performing an evaluation and management service by telephone and the availability of in person appointments. The patient expressed understanding and agreed to proceed.  Interactive audio and video telecommunications were attempted between this nurse and patient, however failed, due to patient having technical difficulties OR patient did not have access to video capability.  We continued and completed visit with audio only.  Some vital signs may be absent or patient reported.   Criselda Peaches, LPN  Cardiac Risk Factors include: advanced age (>45mn, >>25women);diabetes mellitus;male gender     Objective:    Today's Vitals   07/09/22 0954  Weight: 194 lb (88 kg)  Height: '5\' 6"'$  (1.676 m)   Body mass index is 31.31 kg/m.     07/09/2022   10:02 AM 05/20/2022    7:17 AM 05/14/2022   10:19 AM 07/08/2021    2:44 PM 07/17/2020    2:06 PM 04/29/2017    4:23 PM 02/06/2015   10:16 AM  Advanced Directives  Does Patient Have a Medical Advance Directive? Yes Yes Yes Yes Yes Yes No  Type of AParamedicof AChe[place]Living will [place]Living will Living will;Healthcare Power of [place]Living will [place]ensing[place]Living will HPaint RockLiving will   Does patient want to make changes to medical advance directive? No - Patient declined    No - Patient declined    Copy of [place] Chart? Yes - validated most recent copy scanned in chart (See row information)   No - copy requested No - copy requested No - copy requested     Current Medications (verified) Outpatient Encounter Medications as of 07/09/2022  Medication Sig   atorvastatin (LIPITOR) 20 MG tablet Take on Mondays, Wednesdays, and Fridays   benazepril-hydrochlorthiazide (LOTENSIN HCT) 20-25 MG tablet Take 1 tablet by mouth daily.   Continuous Blood Gluc Sensor (FREESTYLE LIBRE 2 SENSOR) MISC CHANGE EVERY 14 DAYS   CREON 36000-114000 units CPEP capsule Take 1-2 capsules by mouth See admin instructions. Take 2 capsule with each meal and 1 capsule with a snack   felodipine (PLENDIL) 5 MG 24 hr tablet Take 1 tablet (5 mg total) by mouth daily.   glipiZIDE (GLUCOTROL) 5 MG tablet TWO before breakfast and ONE and a HALF  tablets before Supper   ibuprofen (ADVIL) 200 MG tablet Take 200 mg by mouth every 6 (six) hours as needed for headache or moderate pain.   loratadine (CLARITIN) 10 MG tablet Take 10 mg by mouth daily as needed for allergies.   metFORMIN (GLUCOPHAGE-XR) 500 MG 24 hr tablet TAKE 2 TABLETS BY MOUTH EVERY MORNING & AT BEDTIME   Multiple Vitamin (MULTIVITAMIN WITH MINERALS) TABS tablet Take 1 tablet by mouth daily.   OVER THE COUNTER MEDICATION Apply 1 Application topically daily as needed (pain). CBD cream   No facility-administered encounter medications on file as of 07/09/2022.    Allergies (verified) Patient has no  known allergies.   History: Past Medical History:  Diagnosis Date   Arthritis    Cancer ([place])    Diabetes mellitus    Hypertension    Palpitations 06/10/2010   Pancreatitis    Pneumonia    Past Surgical History:  Procedure Laterality Date   APPENDECTOMY  1957   COLONOSCOPY  12/19/2018   per Dr. Cristina Gong, adenmatous polyps, repeat in 3 years    IR RADIOLOGIST EVAL & MGMT  04/02/2022   IR RADIOLOGIST EVAL & MGMT  06/15/2022   KNEE SURGERY  1990s, 2015, 2016    Torn meniscus L scope 2016, R 2015, L 1991 scope   RADIOFREQUENCY ABLATION Right 05/20/2022   Procedure: RIGHT CRYOABLATION;  Surgeon: Greggory Keen, MD;  Location: [facility];  Service: Anesthesiology;  Laterality: Right;   SALIVARY GLAND SURGERY     as baby- removal- states tumor   TONSILLECTOMY     Family History  Problem Relation Age of Onset   Stroke Mother    Diabetes Mother    Osteoporosis Mother    Dementia Mother    Heart disease Mother        pacemaker   Hyperlipidemia Father        diet controlled   CAD Father        stents in 71s or 5s   Arthritis Sister    Hyperthyroidism Sister    Social History   Socioeconomic History   Marital status: Married    Spouse name: Not on file   Number of children: Not on file   Years of education: Not on file   Highest education level: Not on file  Occupational History   Not on file  Tobacco Use   Smoking status: Never   Smokeless tobacco: Never  Vaping Use   Vaping Use: Never used  Substance and Sexual Activity   Alcohol use: Yes    Alcohol/week: 1.0 - 7.0 standard drink of alcohol    Types: 1 - 7 Standard drinks or equivalent per week    Comment: occas.   Drug use: Yes    Comment: cbd ointment   Sexual activity: Not on file  Other Topics Concern   Not on file  Social History Narrative   Married. 1 daughter, 2 step sons. 8 grandkids. No greatgrandkids.       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, bridge, travel, reading   Social Determinants of Radio broadcast assistant Strain: Low Risk  (07/09/2022)   Overall Financial Resource Strain (CARDIA)    Difficulty of Paying Living Expenses: Not hard at all  Food Insecurity: No Food Insecurity (07/09/2022)   Hunger Vital Sign    Worried About Running Out of Food in the Last Year: Never true    Ran Out of Food in the Last Year: Never true  Transportation Needs: No Transportation Needs (07/09/2022)   PRAPARE - Radiographer, therapeutic (Medical): No    Lack of Transportation (Non-Medical): No  Physical Activity: Sufficiently Active (07/09/2022)   Exercise Vital Sign    Days of Exercise per Week: 5 days    Minutes of Exercise per Session: 40 min  Stress: No Stress Concern Present (07/09/2022)   [place]    Feeling of Stress : Not at all  Social Connections: Moderately Isolated (07/09/2022)   Social Connection and Isolation Panel [NHANES]    Frequency of Communication with  Friends and Family: More than three times a week    Frequency of Social Gatherings with Friends and Family: More than three times a week    Attends Religious Services: Never    Marine scientist or Organizations: No    Attends Music therapist: Never    Marital Status: Married    Tobacco Counseling Counseling given: Not Answered   Clinical Intake:  Pre-visit preparation completed: No  Pain : No/denies pain   Nutrition Risk Assessment:  Has the patient had any N/V/D within the last 2 months?  No  Does the patient have any non-healing wounds?  No  Has the patient had any unintentional weight loss or weight gain?  No   Diabetes:  Is the patient diabetic?  Yes  If diabetic, was a CBG obtained today?  Yes BG 89 Taken by patient. Libre Monitor Did the patient bring in their glucometer from home?  No  How often do you monitor your CBG's? Libre Monitor.   Financial Strains and Diabetes Management:  Are you having any financial strains with the device, your supplies or your medication? No .  Does the patient want to be seen by Chronic Care Management for management of their diabetes?  No  Would the patient like to be referred to a Nutritionist or for Diabetic Management?  No   Diabetic Exams:  Diabetic Eye Exam: Completed No. Overdue for diabetic eye exam. Pt has been advised about the importance in completing this exam. A referral has been  placed today. Message sent to referral coordinator for scheduling purposes. Advised pt to expect a call from office referred to regarding appt.  Diabetic Foot Exam: Completed No. Pt has been advised about the importance in completing this exam. Pt is scheduled for diabetic foot exam on Followed by PCP.    BMI - recorded: 31.39 Nutritional Status: BMI > 30  Obese Nutritional Risks: None Diabetes: Yes CBG done?: Yes CBG resulted in Enter/ Edit results?: Yes (CBG 8 Taken by patient) Did pt. bring in CBG monitor from home?: No  How often do you need to have someone help you when you read instructions, pamphlets, or other written materials from your doctor or pharmacy?: 1 - Never  Diabetic?  Yes  Interpreter Needed?: No  Information entered by :: Rolene Arbour LPN   Activities of Daily Living    07/09/2022   10:00 AM 05/14/2022   10:21 AM  In your present state of health, do you have any difficulty performing the following activities:  Hearing? 0   Vision? 0   Difficulty concentrating or making decisions? 0   Walking or climbing stairs? 0   Dressing or bathing? 0   Doing errands, shopping? 0 0  Preparing Food and eating ? N   Using the Toilet? N   In the past six months, have you accidently leaked urine? Y   Comment Followed by Urologist   Do you have problems with loss of bowel control? N   Managing your Medications? N   Managing your Finances? N   Housekeeping or managing your Housekeeping? N     Patient Care Team: Laurey Morale, MD as PCP - General (Family Medicine) Rolm Bookbinder, MD as Consulting Physician (Dermatology) Marchia Bond, MD as Consulting Physician (Orthopedic Surgery)  Indicate any recent Medical Services you may have received from other than [facility] providers in the past year (date may be approximate).     Assessment:   This is a routine  wellness examination for Walter Campbell.  Hearing/Vision screen Hearing Screening - Comments:: Denies hearing difficulties    Vision Screening - Comments:: Wears reading glasses - up to date with routine eye exams with  Dr Katy Fitch  Dietary issues and exercise activities discussed: Current Exercise Habits: Home exercise routine, Type of exercise: walking, Time (Minutes): 40, Frequency (Times/Week): 5, Weekly Exercise (Minutes/Week): 200, Intensity: Moderate, Exercise limited by: None identified   Goals Addressed               This Visit's Progress     Patient stated (pt-stated)        Get through the year       Depression Screen    07/09/2022    9:58 AM 12/01/2021    2:15 PM 07/08/2021    2:46 PM 07/08/2021    2:42 PM 11/13/2020    2:55 PM 07/17/2020    2:09 PM 11/21/2018    9:01 AM  PHQ 2/9 Scores  PHQ - 2 Score 0 0 0 0 0 0 0  PHQ- 9 Score  2    0     Fall Risk    07/09/2022   10:01 AM 12/01/2021    2:16 PM 07/08/2021    2:45 PM 11/13/2020    2:56 PM 07/17/2020    2:08 PM  [place] in the past year? 0 0 0 0 0  Number falls in past yr: 0 0 0  0  Injury with Fall? 0 0 0  0  Risk for fall due to : No Fall Risks No Fall Risks No Fall Risks  Medication side effect  Follow up Falls prevention discussed  Falls evaluation completed  Falls evaluation completed;Falls prevention discussed    FALL RISK PREVENTION PERTAINING TO THE HOME:  Any stairs in or around the home? Yes  If so, are there any without handrails? No  Home free of loose throw rugs in walkways, pet beds, electrical cords, etc? Yes  Adequate lighting in your home to reduce risk of falls? Yes   ASSISTIVE DEVICES UTILIZED TO PREVENT FALLS:  Life alert? Yes  Use of a cane, walker or w/c? No  Grab bars in the bathroom? Yes  Shower chair or bench in shower? Yes  Elevated toilet seat or a handicapped toilet? No   TIMED UP AND GO:  Was the test performed? No . Audio Visit  Cognitive Function:        07/09/2022   10:02 AM  6CIT Screen  What Year? 0 points  What month? 0 points  What time? 0 points  Count back from 20 0  points  Months in reverse 0 points  Repeat phrase 0 points  Total Score 0 points    Immunizations Immunization History  Administered Date(s) Administered   Fluad Quad(high Dose 65+) 07/28/2019, 07/31/2020   Influenza Split 08/10/2011, 08/29/2012   Influenza Whole 12/04/2009, 09/10/2010   Influenza, High Dose Seasonal PF 07/10/2016, 08/20/2017, 09/12/2018   Influenza,inj,Quad PF,6+ Mos 07/19/2013, 10/02/2014, 09/06/2015   Influenza-Unspecified 08/12/2021   PFIZER(Purple Top)SARS-COV-2 Vaccination 12/14/2019, 01/08/2020, 08/18/2020   Pfizer Covid-19 Vaccine Bivalent Booster 81yr & up 07/23/2021   Pneumococcal Conjugate-13 11/27/2014   Pneumococcal Polysaccharide-23 12/04/2009   Td 11/09/2006   Tdap 12/30/2016   Zoster, Live 10/15/2011    TDAP status: Up to date  Flu Vaccine status: Up to date  Pneumococcal vaccine status: Up to date  Covid-19 vaccine status: Completed vaccines  Qualifies for Shingles Vaccine? Yes   Zostavax  completed Yes   Shingrix Completed?: Yes  Screening Tests Health Maintenance  Topic Date Due   INFLUENZA VACCINE  06/09/2022   COVID-19 Vaccine (5 - Pfizer series) 07/25/2022 (Originally 11/22/2021)   FOOT EXAM  12/09/2022 (Originally 07/14/2018)   Hepatitis C Screening  07/10/2023 (Originally 09/16/1959)   HEMOGLOBIN A1C  11/14/2022   OPHTHALMOLOGY EXAM  11/18/2022   COLONOSCOPY (Pts 45-72yr Insurance coverage will need to be confirmed)  12/20/2023   TETANUS/TDAP  12/30/2026   Pneumonia Vaccine 80 Years old  Completed   Zoster Vaccines- Shingrix  Completed   HPV VACCINES  Aged Out    Health Maintenance  Health Maintenance Due  Topic Date Due   INFLUENZA VACCINE  06/09/2022    Colorectal cancer screening: Type of screening: Colonoscopy. Completed 12/19/18. Repeat every 5 years  Lung Cancer Screening: (Low Dose CT Chest recommended if Age 80-80years, 30 pack-year currently smoking OR have quit w/in 15years.) does not qualify.     Additional Screening:  Hepatitis C Screening: does not qualify; Completed   Vision Screening: Recommended annual ophthalmology exams for early detection of glaucoma and other disorders of the eye. Is the patient up to date with their annual eye exam?  Yes  Who is the provider or what is the name of the office in which the patient attends annual eye exams? Dr GKaty FitchIf pt is not established with a provider, would they like to be referred to a provider to establish care? No .   Dental Screening: Recommended annual dental exams for proper oral hygiene  Community Resource Referral / Chronic Care Management:  CRR required this visit?  No   CCM required this visit?  No      Plan:     I have personally reviewed and noted the following in the patient's chart:   Medical and social history Use of alcohol, tobacco or illicit drugs  Current medications and supplements including opioid prescriptions. Patient is not currently taking opioid prescriptions. Functional ability and status Nutritional status Physical activity Advanced directives List of other physicians Hospitalizations, surgeries, and ER visits in previous 12 months Vitals Screenings to include cognitive, depression, and falls Referrals and appointments  In addition, I have reviewed and discussed with patient certain preventive protocols, quality metrics, and best practice recommendations. A written personalized care plan for preventive services as well as general preventive health recommendations were provided to patient.     BCriselda Peaches LPN   [id number]  Nurse Notes: None

## 2022-07-09 NOTE — Patient Instructions (Addendum)
Walter Campbell , Thank you for taking time to come for your Medicare Wellness Visit. I appreciate your ongoing commitment to your health goals. Please review the following plan we discussed and let me know if I can assist you in the future.   Screening recommendations/referrals: Colonoscopy: Done 12/19/18 Repeat 5 Yrs Recommended yearly ophthalmology/optometry visit for glaucoma screening and checkup Recommended yearly dental visit for hygiene and checkup  Vaccinations: Influenza vaccine: Up to date Pneumococcal vaccine: Up to date Tdap vaccine: Up to date Shingles vaccine: Up to date   Covid-19: Done  Advanced directives: Copy in file  Conditions/risks identified: None  Next appointment: Follow up in one year for your annual wellness visit.    Preventive Care 80 Years and Older, Male  Preventive care refers to lifestyle choices and visits with your health care provider that can promote health and wellness. What does preventive care include? A yearly physical exam. This is also called an annual well check. Dental exams once or twice a year. Routine eye exams. Ask your health care provider how often you should have your eyes checked. Personal lifestyle choices, including: Daily care of your teeth and gums. Regular physical activity. Eating a healthy diet. Avoiding tobacco and drug use. Limiting alcohol use. Practicing safe sex. Taking low doses of aspirin every day. Taking vitamin and mineral supplements as recommended by your health care provider. What happens during an annual well check? The services and screenings done by your health care provider during your annual well check will depend on your age, overall health, lifestyle risk factors, and family history of disease. Counseling  Your health care provider may ask you questions about your: Alcohol use. Tobacco use. Drug use. Emotional well-being. Home and relationship well-being. Sexual activity. Eating habits. History  of falls. Memory and ability to understand (cognition). Work and work Statistician. Screening  You may have the following tests or measurements: Height, weight, and BMI. Blood pressure. Lipid and cholesterol levels. These may be checked every 5 years, or more frequently if you are over 57 years old. Skin check. Lung cancer screening. You may have this screening every year starting at age 48 if you have a 30-pack-year history of smoking and currently smoke or have quit within the past 15 years. Fecal occult blood test (FOBT) of the stool. You may have this test every year starting at age 73. Flexible sigmoidoscopy or colonoscopy. You may have a sigmoidoscopy every 5 years or a colonoscopy every 10 years starting at age 5. Prostate cancer screening. Recommendations will vary depending on your family history and other risks. Hepatitis C blood test. Hepatitis B blood test. Sexually transmitted disease (STD) testing. Diabetes screening. This is done by checking your blood sugar (glucose) after you have not eaten for a while (fasting). You may have this done every 1-3 years. Abdominal aortic aneurysm (AAA) screening. You may need this if you are a current or former smoker. Osteoporosis. You may be screened starting at age 24 if you are at high risk. Talk with your health care provider about your test results, treatment options, and if necessary, the need for more tests. Vaccines  Your health care provider may recommend certain vaccines, such as: Influenza vaccine. This is recommended every year. Tetanus, diphtheria, and acellular pertussis (Tdap, Td) vaccine. You may need a Td booster every 10 years. Zoster vaccine. You may need this after age 46. Pneumococcal 13-valent conjugate (PCV13) vaccine. One dose is recommended after age 37. Pneumococcal polysaccharide (PPSV23) vaccine. One dose is  recommended after age 59. Talk to your health care provider about which screenings and vaccines you need  and how often you need them. This information is not intended to replace advice given to you by your health care provider. Make sure you discuss any questions you have with your health care provider. Document Released: 11/22/2015 Document Revised: 07/15/2016 Document Reviewed: 08/27/2015 Elsevier Interactive Patient Education  2017 [place] Prevention in the Home Falls can cause injuries. They can happen to people of all ages. There are many things you can do to make your home safe and to help prevent falls. What can I do on the outside of my home? Regularly fix the edges of walkways and driveways and fix any cracks. Remove anything that might make you trip as you walk through a door, such as a raised step or threshold. Trim any bushes or trees on the path to your home. Use bright outdoor lighting. Clear any walking paths of anything that might make someone trip, such as rocks or tools. Regularly check to see if handrails are loose or broken. Make sure that both sides of any steps have handrails. Any raised decks and porches should have guardrails on the edges. Have any leaves, snow, or ice cleared regularly. Use sand or salt on walking paths during winter. Clean up any spills in your garage right away. This includes oil or grease spills. What can I do in the bathroom? Use night lights. Install grab bars by the toilet and in the tub and shower. Do not use towel bars as grab bars. Use non-skid mats or decals in the tub or shower. If you need to sit down in the shower, use a plastic, non-slip stool. Keep the floor dry. Clean up any water that spills on the floor as soon as it happens. Remove soap buildup in the tub or shower regularly. Attach bath mats securely with double-sided non-slip rug tape. Do not have throw rugs and other things on the floor that can make you trip. What can I do in the bedroom? Use night lights. Make sure that you have a light by your bed that is easy  to reach. Do not use any sheets or blankets that are too big for your bed. They should not hang down onto the floor. Have a firm chair that has side arms. You can use this for support while you get dressed. Do not have throw rugs and other things on the floor that can make you trip. What can I do in the kitchen? Clean up any spills right away. Avoid walking on wet floors. Keep items that you use a lot in easy-to-reach places. If you need to reach something above you, use a strong step stool that has a grab bar. Keep electrical cords out of the way. Do not use floor polish or wax that makes floors slippery. If you must use wax, use non-skid floor wax. Do not have throw rugs and other things on the floor that can make you trip. What can I do with my stairs? Do not leave any items on the stairs. Make sure that there are handrails on both sides of the stairs and use them. Fix handrails that are broken or loose. Make sure that handrails are as long as the stairways. Check any carpeting to make sure that it is firmly attached to the stairs. Fix any carpet that is loose or worn. Avoid having throw rugs at the top or bottom of the stairs. If  you do have throw rugs, attach them to the floor with carpet tape. Make sure that you have a light switch at the top of the stairs and the bottom of the stairs. If you do not have them, ask someone to add them for you. What else can I do to help prevent falls? Wear shoes that: Do not have high heels. Have rubber bottoms. Are comfortable and fit you well. Are closed at the toe. Do not wear sandals. If you use a stepladder: Make sure that it is fully opened. Do not climb a closed stepladder. Make sure that both sides of the stepladder are locked into place. Ask someone to hold it for you, if possible. Clearly mark and make sure that you can see: Any grab bars or handrails. First and last steps. Where the edge of each step is. Use tools that help you move  around (mobility aids) if they are needed. These include: Canes. Walkers. Scooters. Crutches. Turn on the lights when you go into a dark area. Replace any light bulbs as soon as they burn out. Set up your furniture so you have a clear path. Avoid moving your furniture around. If any of your floors are uneven, fix them. If there are any pets around you, be aware of where they are. Review your medicines with your doctor. Some medicines can make you feel dizzy. This can increase your chance of falling. Ask your doctor what other things that you can do to help prevent falls. This information is not intended to replace advice given to you by your health care provider. Make sure you discuss any questions you have with your health care provider. Document Released: 08/22/2009 Document Revised: 04/02/2016 Document Reviewed: 11/30/2014 Elsevier Interactive Patient Education  2017 [employer].

## 2022-07-24 ENCOUNTER — Encounter: Payer: Self-pay | Admitting: Family Medicine

## 2022-07-24 ENCOUNTER — Ambulatory Visit: Payer: Medicare PPO | Admitting: Family Medicine

## 2022-07-24 VITALS — BP 108/60 | HR 75 | Temp 97.8°F | Ht 66.0 in | Wt 193.7 lb

## 2022-07-24 DIAGNOSIS — M5432 Sciatica, left side: Secondary | ICD-10-CM | POA: Diagnosis not present

## 2022-07-24 MED ORDER — PREDNISONE 10 MG PO TABS: ORAL_TABLET | ORAL | 0 refills | Status: DC

## 2022-07-24 NOTE — Progress Notes (Signed)
Established Patient Office Visit  Subjective   Patient ID: Walter Campbell, male    DOB: 1941-03-10  Age: 81 y.o. MRN: [id number]  Chief Complaint  Patient presents with   Leg Pain    Patient complains of left leg pain, x3 weeks,     HPI   Seen with ongoing pain left lower extremity.  Radiates from the gluteus region all the way down to the foot intermittently.  Will still out of place 17 holes of golf earlier today.  Tolerating walking fairly well.  Symptoms worse seated.  Sharp pain.  Sometimes worse at night.  Was seen recently for this.  We were trying to avoid steroids because of his diabetes.  Diabetes is well controlled.  Recent A1c 6.6%.  Has been taking occasional Advil.  No urine or stool incontinence.  No numbness lower extremity.  No weakness lower extremity.  Denies any injury.  Past Medical History:  Diagnosis Date   Arthritis    Cancer ([place])    Diabetes mellitus    Hypertension    Palpitations 06/10/2010   Pancreatitis    Pneumonia    Past Surgical History:  Procedure Laterality Date   APPENDECTOMY  1957   COLONOSCOPY  12/19/2018   per Dr. 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   TONSILLECTOMY      reports that he has never smoked. He has never used smokeless tobacco. He reports current alcohol use of about 1.0 - 7.0 standard drink of alcohol per week. He reports current drug use. family history includes Arthritis in his sister; CAD in his father; Dementia in his mother; Diabetes in his mother; Heart disease in his mother; Hyperlipidemia in his father;  Hyperthyroidism in his sister; Osteoporosis in his mother; Stroke in his mother. No Known Allergies  Review of Systems  Constitutional:  Negative for chills, fever and weight loss.  Musculoskeletal:  Positive for back pain.  Neurological:  Negative for tingling and weakness.      Objective:     BP 108/60 (BP Location: Left Arm, Patient Position: Sitting, Cuff Size: Normal)   Pulse 75   Temp 97.8 F (36.6 C) (Oral)   Ht '5\' 6"'$  (1.676 m)   Wt 193 lb 11.2 oz (87.9 kg)   SpO2 98%   BMI 31.26 kg/m    Physical Exam Vitals reviewed.  Constitutional:      Appearance: Normal appearance.  Cardiovascular:     Rate and Rhythm: Normal rate and regular rhythm.  Pulmonary:     Effort: Pulmonary effort is normal.     Breath sounds: Normal breath sounds.  Musculoskeletal:     Comments: Straight leg raises are negative bilaterally  Neurological:     Mental Status: He is alert.     Comments: Full strength left lower extremity.  Knee reflexes were difficult to elicit bilaterally.  Trace Achilles reflex bilaterally.      No results found for any visits on 07/24/22.    The ASCVD Risk score (Arnett DK, et al., 2019) failed  to calculate for the following reasons:   The valid total cholesterol range is 130 to 320 mg/dL    Assessment & Plan:   Lower back pain more gluteal region with sciatica type symptoms on the left.  Nonfocal neuro exam.  Continue walking as tolerated.  We did discuss possible low-dose prednisone taper.  He is aware this may exacerbate his diabetes.  He will monitor closely.  Avoid significant back flexion or any heavy lifting Back extension exercises as tolerated If not improving over the next week or 2 may need to consider possible MRI to further assess  No follow-ups on file.    Carolann Littler, MD

## 2022-08-07 ENCOUNTER — Encounter: Payer: Self-pay | Admitting: Family Medicine

## 2022-08-07 ENCOUNTER — Ambulatory Visit: Payer: Medicare PPO | Admitting: Family Medicine

## 2022-08-07 VITALS — BP 118/68 | HR 60 | Temp 98.1°F | Wt 186.0 lb

## 2022-08-07 DIAGNOSIS — M5416 Radiculopathy, lumbar region: Secondary | ICD-10-CM | POA: Diagnosis not present

## 2022-08-07 DIAGNOSIS — I440 Atrioventricular block, first degree: Secondary | ICD-10-CM | POA: Diagnosis not present

## 2022-08-07 NOTE — Progress Notes (Signed)
   Subjective:    Patient ID: Walter Campbell, male    DOB: [birth year], 81 y.o.   MRN: [id number]  HPI Here for 2 issues. First he has had 5 weeks of sharp pains that start in the left buttock and run down the leg to the foot. There is no back pain, no numbness or weakness. No hx of trauma. He saw Dr. Elease Hashimoto twice in the past few weeks, and he took a Prednisone taper. This did not help the pain at all. The pain is worse during the night or when he gets up in the mornings. Then it eases up during the day. In fact he can play golf, and it does not bother him at all. He takes ES Tylenol in the evenings. The other issue is an abnormal EKG.  About 2 months ago he was preparing to have a tooth extracted, and the oral surgeon got an EKG. This was remarkable for first degree AV block. He was told to see [place] about this. This was not evident on an EKG I found from 2015. He denies any chest pain or lightheadedness or SOB.   Review of Systems  Constitutional: Negative.   Respiratory: Negative.    Cardiovascular: Negative.   Musculoskeletal:  Positive for back pain.  Neurological:  Positive for weakness and numbness.       Objective:   Physical Exam Constitutional:      General: He is not in acute distress.    Appearance: Normal appearance.  Cardiovascular:     Rate and Rhythm: Normal rate and regular rhythm.     Pulses: Normal pulses.     Heart sounds: Normal heart sounds.     Comments: EKG today shows NSR with first degree AV block  Pulmonary:     Effort: Pulmonary effort is normal.     Breath sounds: Normal breath sounds.  Musculoskeletal:     Comments: He is not tender over the lower spine, but he is tender over the left sciatic notch. The spine has full ROM, negative SLR on both sides   Neurological:     Mental Status: He is alert.           Assessment & Plan:  He has first degree AV block. I explained that this is usually a benign condition, but we do need to be careful about  what medications he takes. We will get an ECHO to examine the heart structure. He also has left lumbar radiculopathy. He had a lumbar MRI in 2007 which showed  bulging disc at l4-5 as well as some mild central canal stenosis. We will set up another lumbar MRI to see if this has progressed since then.  Alysia Penna, MD

## 2022-08-16 ENCOUNTER — Ambulatory Visit
Admission: RE | Admit: 2022-08-16 | Discharge: 2022-08-16 | Disposition: A | Discharge status: Home / Self Care | Payer: Medicare PPO | Source: Ambulatory Visit | Attending: Family Medicine | Admitting: Family Medicine

## 2022-08-16 DIAGNOSIS — M48061 Spinal stenosis, lumbar region without neurogenic claudication: Secondary | ICD-10-CM | POA: Diagnosis not present

## 2022-08-16 DIAGNOSIS — M5416 Radiculopathy, lumbar region: Secondary | ICD-10-CM | POA: Diagnosis not present

## 2022-08-16 DIAGNOSIS — M5136 Other intervertebral disc degeneration, lumbar region: Secondary | ICD-10-CM | POA: Diagnosis not present

## 2022-08-18 ENCOUNTER — Other Ambulatory Visit: Payer: Self-pay | Admitting: Interventional Radiology

## 2022-08-18 DIAGNOSIS — N2889 Other specified disorders of kidney and ureter: Secondary | ICD-10-CM

## 2022-08-19 NOTE — Addendum Note (Signed)
Addended by: Alysia Penna A on: 08/19/2022 05:24 PM   Modules accepted: Orders

## 2022-08-21 ENCOUNTER — Telehealth: Payer: Self-pay | Admitting: Family Medicine

## 2022-08-21 NOTE — Telephone Encounter (Signed)
Pt called to inform MD that he called [facility] and they are telling him they have not received a referral for him.

## 2022-08-21 NOTE — Telephone Encounter (Signed)
Can you help with this? Thanks

## 2022-08-25 ENCOUNTER — Other Ambulatory Visit: Payer: Self-pay | Admitting: Urology

## 2022-08-25 DIAGNOSIS — H43813 Vitreous degeneration, bilateral: Secondary | ICD-10-CM | POA: Diagnosis not present

## 2022-08-25 DIAGNOSIS — Z961 Presence of intraocular lens: Secondary | ICD-10-CM | POA: Diagnosis not present

## 2022-08-25 DIAGNOSIS — H0102B Squamous blepharitis left eye, upper and lower eyelids: Secondary | ICD-10-CM | POA: Diagnosis not present

## 2022-08-25 DIAGNOSIS — E119 Type 2 diabetes mellitus without complications: Secondary | ICD-10-CM | POA: Diagnosis not present

## 2022-08-25 DIAGNOSIS — H353131 Nonexudative age-related macular degeneration, bilateral, early dry stage: Secondary | ICD-10-CM | POA: Diagnosis not present

## 2022-08-25 DIAGNOSIS — D49511 Neoplasm of unspecified behavior of right kidney: Secondary | ICD-10-CM

## 2022-08-25 DIAGNOSIS — H0102A Squamous blepharitis right eye, upper and lower eyelids: Secondary | ICD-10-CM | POA: Diagnosis not present

## 2022-08-25 LAB — HM DIABETES EYE EXAM

## 2022-08-26 ENCOUNTER — Ambulatory Visit (INDEPENDENT_AMBULATORY_CARE_PROVIDER_SITE_OTHER): Payer: Medicare PPO

## 2022-08-26 DIAGNOSIS — I440 Atrioventricular block, first degree: Secondary | ICD-10-CM

## 2022-08-26 LAB — ECHOCARDIOGRAM COMPLETE
Area-P 1/2: 3.37 cm2
S' Lateral: 3.76 cm

## 2022-09-10 DIAGNOSIS — M48062 Spinal stenosis, lumbar region with neurogenic claudication: Secondary | ICD-10-CM | POA: Diagnosis not present

## 2022-09-10 DIAGNOSIS — M4310 Spondylolisthesis, site unspecified: Secondary | ICD-10-CM | POA: Diagnosis not present

## 2022-09-11 DIAGNOSIS — R69 Illness, unspecified: Secondary | ICD-10-CM | POA: Diagnosis not present

## 2022-09-21 ENCOUNTER — Ambulatory Visit (HOSPITAL_COMMUNITY)
Admission: RE | Admit: 2022-09-21 | Discharge: 2022-09-21 | Disposition: A | Discharge status: Home / Self Care | Payer: Medicare PPO | Source: Ambulatory Visit | Attending: Interventional Radiology | Admitting: Interventional Radiology

## 2022-09-21 ENCOUNTER — Encounter (HOSPITAL_COMMUNITY): Payer: Self-pay

## 2022-09-21 DIAGNOSIS — K8689 Other specified diseases of pancreas: Secondary | ICD-10-CM | POA: Diagnosis not present

## 2022-09-21 DIAGNOSIS — N2889 Other specified disorders of kidney and ureter: Secondary | ICD-10-CM | POA: Diagnosis not present

## 2022-09-21 DIAGNOSIS — C641 Malignant neoplasm of right kidney, except renal pelvis: Secondary | ICD-10-CM | POA: Diagnosis not present

## 2022-09-21 DIAGNOSIS — K861 Other chronic pancreatitis: Secondary | ICD-10-CM | POA: Diagnosis not present

## 2022-09-21 LAB — POCT I-STAT CREATININE: Creatinine, Ser: 0.8 mg/dL (ref 0.61–1.24)

## 2022-09-21 MED ORDER — SODIUM CHLORIDE (PF) 0.9 % IJ SOLN
INTRAMUSCULAR | Status: AC
  Filled 2022-09-21: qty 50

## 2022-09-21 MED ORDER — IOHEXOL 300 MG/ML  SOLN
100.0000 mL | Freq: Once | INTRAMUSCULAR | Status: AC | PRN
  Administered 2022-09-21: 100 mL via INTRAVENOUS

## 2022-09-29 ENCOUNTER — Telehealth: Payer: Medicare PPO

## 2022-09-29 ENCOUNTER — Ambulatory Visit
Admission: RE | Admit: 2022-09-29 | Discharge: 2022-09-29 | Disposition: A | Discharge status: Home / Self Care | Payer: Medicare PPO | Source: Ambulatory Visit | Attending: Interventional Radiology | Admitting: Interventional Radiology

## 2022-09-29 ENCOUNTER — Other Ambulatory Visit: Payer: Self-pay | Admitting: Interventional Radiology

## 2022-09-29 DIAGNOSIS — N2889 Other specified disorders of kidney and ureter: Secondary | ICD-10-CM

## 2022-09-29 DIAGNOSIS — Z9889 Other specified postprocedural states: Secondary | ICD-10-CM | POA: Diagnosis not present

## 2022-09-29 NOTE — Progress Notes (Signed)
Patient ID: Walter Campbell, male   DOB: 22-Mar-1941, 81 y.o.   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 Lovena Neighbours  History of Present Illness: Walter Campbell is a 81 y.o. male who is now 85-monthstatus post right renal neoplasm CT-guided cryoablation.  [facility]  Overall he continues to do very well and is fully recovered.  No current urinary tract symptoms.  No flank pain, abdominal pain, dysuria.  No hematuria.  Follow-up imaging performed 09/21/2022.  This confirms expected right kidney midpole ablation defect without evidence of residual lesion.  No marginal enhancement.  No additional renal abnormality or acute finding.  Past Medical History:  Diagnosis Date   Arthritis    Diabetes mellitus    Hypertension    Palpitations 06/10/2010   Pancreatitis    Pneumonia    renal ca 05/20/2022    Past Surgical History:  Procedure Laterality Date   APPENDECTOMY  1957   COLONOSCOPY  12/19/2018   per Dr. BCristina Gong adenmatous polyps, repeat in 3 years    IR RADIOLOGIST EVAL & MGMT  04/02/2022   IR RADIOLOGIST EVAL & MGMT  06/15/2022   KNEE SURGERY  1990s, 2015, 2016   Torn meniscus L scope 2016, R 2015, L 1991 scope   RADIOFREQUENCY ABLATION Right 05/20/2022   Procedure: RIGHT CRYOABLATION;  Surgeon: SGreggory Keen MD;  Location: [facility];  Service: Anesthesiology;  Laterality: Right;   SALIVARY GLAND SURGERY     as baby- removal- states tumor   TONSILLECTOMY      Allergies: Patient has no known allergies.  Medications: Prior to Admission medications   Medication Sig Start Date End Date Taking? Authorizing Provider  atorvastatin (LIPITOR) 20 MG tablet Take on Mondays, Wednesdays, and Fridays 12/01/21   FLaurey Morale MD  benazepril-hydrochlorthiazide (LOTENSIN HCT) 20-25 MG tablet Take 1 tablet by mouth daily. 12/01/21   FLaurey Morale MD  Continuous Blood Gluc Sensor (FREESTYLE LIBRE  2 SENSOR) MISC CHANGE EVERY 14 DAYS 06/26/22   Shamleffer, IMelanie Crazier MD  CREON 3618-023-6101units CPEP capsule Take 1-2 capsules by mouth See admin instructions. Take 2 capsule with each meal and 1 capsule with a snack 02/03/22   [provider]  felodipine (PLENDIL) 5 MG 24 hr tablet Take 1 tablet (5 mg total) by mouth daily. 12/01/21   FLaurey Morale MD  glipiZIDE (GLUCOTROL) 5 MG tablet TWO before breakfast and ONE and a HALF  tablets before Supper 06/23/22   Shamleffer, IMelanie Crazier MD  ibuprofen (ADVIL) 200 MG tablet Take 200 mg by mouth every 6 (six) hours as needed for headache or moderate pain.    [provider]  loratadine (CLARITIN) 10 MG tablet Take 10 mg by mouth daily as needed for allergies.    [provider]  metFORMIN (GLUCOPHAGE-XR) 500 MG 24 hr tablet TAKE 2 TABLETS BY MOUTH EVERY MORNING & AT BEDTIME 06/26/22   Shamleffer, IMelanie Crazier MD  Multiple Vitamin (MULTIVITAMIN WITH MINERALS) TABS tablet Take 1 tablet by mouth daily.    [provider]  OVER THE COUNTER MEDICATION Apply 1 Application topically daily as needed (pain). CBD cream    [provider]     Family History  Problem Relation Age of Onset   Stroke Mother    Diabetes Mother    Osteoporosis Mother    Dementia Mother    Heart disease Mother  pacemaker   Hyperlipidemia Father        diet controlled   CAD Father        stents in 91s or 69s   Arthritis Sister    Hyperthyroidism Sister     Social History   Socioeconomic History   Marital status: Married    Spouse name: Not on file   Number of children: Not on file   Years of education: Not on file   Highest education level: Not on file  Occupational History   Not on file  Tobacco Use   Smoking status: Never   Smokeless tobacco: Never  Vaping Use   Vaping Use: Never used  Substance and Sexual Activity   Alcohol use: Yes    Alcohol/week: 1.0 - 7.0 standard drink of alcohol    Types: 1  - 7 Standard drinks or equivalent per week    Comment: occas.   Drug use: Yes    Comment: cbd ointment   Sexual activity: Not on file  Other Topics Concern   Not on file  Social History Narrative   Married. 1 daughter, 2 step sons. 8 grandkids. No greatgrandkids.       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, bridge, travel, reading   Social Determinants of Radio broadcast assistant Strain: Low Risk  (07/09/2022)   Overall Financial Resource Strain (CARDIA)    Difficulty of Paying Living Expenses: Not hard at all  Food Insecurity: No Food Insecurity (07/09/2022)   Hunger Vital Sign    Worried About Running Out of Food in the Last Year: Never true    Ran Out of Food in the Last Year: Never true  Transportation Needs: No Transportation Needs (07/09/2022)   PRAPARE - Hydrologist (Medical): No    Lack of Transportation (Non-Medical): No  Physical Activity: Sufficiently Active (07/09/2022)   Exercise Vital Sign    Days of Exercise per Week: 5 days    Minutes of Exercise per Session: 40 min  Stress: No Stress Concern Present (07/09/2022)   [place]    Feeling of Stress : Not at all  Social Connections: Moderately Isolated (07/09/2022)   Social Connection and Isolation Panel [NHANES]    Frequency of Communication with Friends and Family: More than three times a week    Frequency of Social Gatherings with Friends and Family: More than three times a week    Attends Religious Services: Never    Marine scientist or Organizations: No    Attends Archivist Meetings: Never    Marital Status: Married     Review of Systems  Review of Systems: A 12 point ROS discussed and pertinent positives are indicated in the HPI above.  All other systems are negative.  Physical Exam No direct physical exam was performed telephone health visit only today to  review surveillance imaging Vital Signs: There were no vitals taken for this visit.  Imaging: CT ABDOMEN W WO CONTRAST  Result Date: 09/22/2022 CLINICAL DATA:  RIGHT renal cryoablation 05/20/2022 EXAM: CT ABDOMEN WITHOUT AND WITH CONTRAST TECHNIQUE: Multidetector CT imaging of the abdomen was performed following the standard protocol before and following the bolus administration of intravenous contrast. RADIATION DOSE REDUCTION: This exam was performed according to the departmental dose-optimization program which includes automated exposure control, adjustment of the mA and/or kV according to patient size and/or use of iterative  reconstruction technique. CONTRAST:  140m OMNIPAQUE IOHEXOL 300 MG/ML  SOLN COMPARISON:  None Available. FINDINGS: Lower chest: Lung bases are clear. Hepatobiliary: No focal hepatic lesion. Normal gallbladder. No biliary duct dilatation. Common bile duct is normal. Pancreas: Chronic calcifications throughout the pancreas. Chronic dilatation of pancreatic duct. Spleen: Normal spleen Adrenals/urinary tract: Adrenal glands normal. In the RIGHT mid kidney, there is bland nonenhancing tissue at cryoablation site measuring 2.7 x 1.9 cm (image 68/7). No suspicious imaging features for recurrence. No enhancing lesion elsewhere within LEFT or RIGHT kidney. Delayed imaging demonstrates no filling defects in the collecting systems or proximal ureters. Stomach/Bowel: Stomach limited view of the bowel is unremarkable. Vascular/Lymphatic: Abdominal aorta is normal caliber. No periportal or retroperitoneal adenopathy. No pelvic adenopathy. Other: No free fluid. Musculoskeletal: No aggressive osseous lesion. IMPRESSION: 1. Bland nonenhancing tissue at cryoablation site in the mid RIGHT kidney. No evidence of local recurrence. Recommend continued CT surveillance. 2. No enhancing lesion in LEFT or RIGHT kidney. 3. Chronic pancreatic calcifications duct dilatation related to chronic pancreatitis  Electronically Signed   By: SSuzy BouchardM.D.   On: 09/22/2022 10:29    Labs:  CBC: Recent Labs    12/04/21 0912 05/14/22 1046  WBC 7.2 7.0  HGB 13.4 13.3  HCT 41.0 41.4  PLT 239.0 245    COAGS: Recent Labs    05/14/22 1046  INR 1.0    BMP: Recent Labs    12/04/21 0912 05/14/22 1046 09/21/22 0951  NA 140 140  --   K 4.0 4.4  --   CL 104 108  --   CO2 28 27  --   GLUCOSE 132* 163*  --   BUN 23 21  --   CALCIUM 9.0 9.4  --   CREATININE 0.91 0.80 0.80  GFRNONAA  --  >60  --     LIVER FUNCTION TESTS: Recent Labs    12/04/21 0912  BILITOT 0.6  AST 13  ALT 16  ALKPHOS 66  PROT 6.2  ALBUMIN 4.2    Assessment and Plan:  4 months status post CT-guided 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  Procedure performed 05/20/2022.  He has completely recovered and remains asymptomatic.  Surveillance imaging demonstrates a nonenhancing ablation defect.  No suspicious abnormality or residual disease by CT.  All questions addressed.  Patient has a complete understanding of the surveillance imaging findings.  Plan: Scheduled for surveillance CT without and with contrast in approximately 8 months which will be 1 year from treatment (July 2024).   Electronically Signed: MGreggory Keen11/21/2023, 11:44 AM   I spent a total of    40 Minutes in remote  clinical consultation, greater than 50% of which was counseling/coordinating care for this patient status post renal cryoablation.    Visit type: Audio only (telephone). Audio (no video) only due to patient's lack of internet/smartphone capability. [facility] [facility] [place] This visit type was conducted due to national recommendations for restrictions regarding the COVID-19 Pandemic (e.g. social distancing).  This format is felt to be most appropriate for this patient at this time.  All issues noted in this  document were discussed and addressed.

## 2022-09-30 ENCOUNTER — Encounter: Payer: Self-pay | Admitting: Internal Medicine

## 2022-09-30 ENCOUNTER — Ambulatory Visit: Payer: Medicare PPO | Admitting: Internal Medicine

## 2022-09-30 VITALS — BP 134/80 | HR 70 | Ht 66.0 in | Wt 188.0 lb

## 2022-09-30 DIAGNOSIS — E119 Type 2 diabetes mellitus without complications: Secondary | ICD-10-CM | POA: Diagnosis not present

## 2022-09-30 LAB — BASIC METABOLIC PANEL
BUN: 18 mg/dL (ref 6–23)
CO2: 32 mEq/L (ref 19–32)
Calcium: 8.7 mg/dL (ref 8.4–10.5)
Chloride: 102 mEq/L (ref 96–112)
Creatinine, Ser: 0.8 mg/dL (ref 0.40–1.50)
GFR: 83.86 mL/min (ref >60.00)
Glucose, Bld: 183 mg/dL — ABNORMAL HIGH (ref 70–99)
Potassium: 3.8 mEq/L (ref 3.5–5.1)
Sodium: 140 mEq/L (ref 135–145)

## 2022-09-30 LAB — POCT GLYCOSYLATED HEMOGLOBIN (HGB A1C): Hemoglobin A1C: 6.6 % — AB (ref 4.0–5.6)

## 2022-09-30 MED ORDER — GLIPIZIDE 5 MG PO TABS: 5.0000 mg | ORAL_TABLET | Freq: Every day | ORAL | 3 refills | Status: DC

## 2022-09-30 NOTE — Progress Notes (Signed)
Name: Walter Campbell  Age/ Sex: 81 y.o., male   MRN/ DOB: [id number], Feb 05, 1941     PCP: Laurey Morale, MD   Reason for Endocrinology Evaluation: Type 2 Diabetes Mellitus  Initial Endocrine Consultative Visit: 03/10/2021    PATIENT IDENTIFIER: [name] is a 81 y.o. male with a past medical history of T2Dm, HTN and dyslipidemia and Hx of pancreatitis  . The patient has followed with Endocrinology clinic since 03/10/2021 for consultative assistance with management of his diabetes.  DIABETIC HISTORY:  Walter Campbell was diagnosed with DM in 2010, januvia has been ineffective.Marland Kitchen His hemoglobin A1c has ranged from 6.7% in 2021, peaking at 7.6% in 2022.   On his initial visit to our clinic he had an A1c 7.6% , we decreased Glipizide due to hypoglycemia and continued Metformin   SUBJECTIVE:   During the last visit (12/17/2021): A1c 7.0 %     Today (09/30/2022): [name] is here for a follow up on diabetes management . He checks his blood sugars multiple times daily, through CGM. 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 , currently on Hospice   He is S/P cryoablation of right renal neoplasm  05/20/2022 He was diagnosed with  first degree Av block through his PCP 10/20213 On Creon - follows with GI Denies nausea , vomiting Denies abdominal pain or UTI  Was on prednisone for sciatica which caused hyperglycemia   Has decreased Glipizide to 1 tablet before Breakfast and sometimes he does not always take the evenings dose due to hypoglycemia    HOME DIABETES REGIMEN:  Glipizide 5 mg , 1 tablet Before breakfast and 1.5 tab before Supper Metformin 500 mg, 2 tablet before Breakfast and 2 tablet before Supper      Statin: yes ACE-I/ARB: yes Prior Diabetic Education: yes    CONTINUOUS GLUCOSE MONITORING RECORD INTERPRETATION    Dates of Recording:11/9-11/22/2023  Sensor description: freestyle libre  2  Results statistics:   CGM use % of time 68  Average and SD 137/29.9  Time in range   85 %  % Time Above 180 14  % Time above 250 1  % Time Below target 0    Glycemic patterns summary: Optimaly BG's through the night, with postprandial hyperglycemia   Hyperglycemic episodes  post prandial   Hypoglycemic episodes occurred sporadically after breakfast   Overnight periods: optimal       DIABETIC COMPLICATIONS: Microvascular complications:   Denies: CKD, retinopathy, neuropathy Last Eye Exam: Completed 11/2021  Macrovascular complications:   Denies: CAD, CVA, PVD   HISTORY:  Past Medical History:  Past Medical History:  Diagnosis Date   Arthritis    Diabetes mellitus    Hypertension    Palpitations 06/10/2010   Pancreatitis    Pneumonia    renal ca 05/20/2022   Past Surgical History:  Past Surgical History:  Procedure Laterality Date   APPENDECTOMY  1957   COLONOSCOPY  12/19/2018   per Dr. Cristina Gong, adenmatous polyps, repeat in 3 years    IR RADIOLOGIST EVAL & MGMT  04/02/2022   IR RADIOLOGIST EVAL & MGMT  06/15/2022   KNEE SURGERY  1990s, 2015, 2016   Torn meniscus L scope 2016, R 2015, L 1991 scope   RADIOFREQUENCY ABLATION Right 05/20/2022   Procedure: RIGHT CRYOABLATION;  Surgeon: Greggory Keen, MD;  Location: [facility];  Service: Anesthesiology;  Laterality: Right;   SALIVARY GLAND  SURGERY     as baby- removal- states tumor   TONSILLECTOMY     Social History:  reports that he has never smoked. He has never used smokeless tobacco. He reports current alcohol use of about 1.0 - 7.0 standard drink of alcohol per week. He reports current drug use. Family History:  Family History  Problem Relation Age of Onset   Stroke Mother    Diabetes Mother    Osteoporosis Mother    Dementia Mother    Heart disease Mother        pacemaker   Hyperlipidemia Father        diet controlled   CAD Father        stents in 45s or 45s   Arthritis Sister    Hyperthyroidism  Sister      HOME MEDICATIONS: Allergies as of 09/30/2022   No Known Allergies      Medication List        Accurate as of September 30, 2022 11:16 AM. If you have any questions, ask your nurse or doctor.          atorvastatin 20 MG tablet Commonly known as: LIPITOR Take on Mondays, Wednesdays, and Fridays   benazepril-hydrochlorthiazide 20-25 MG tablet Commonly known as: LOTENSIN HCT Take 1 tablet by mouth daily.   Creon 36000 UNITS Cpep capsule Generic drug: lipase/protease/amylase Take 1-2 capsules by mouth See admin instructions. Take 2 capsule with each meal and 1 capsule with a snack   felodipine 5 MG 24 hr tablet Commonly known as: PLENDIL Take 1 tablet (5 mg total) by mouth daily.   FreeStyle Libre 2 Sensor Misc CHANGE EVERY 14 DAYS   glipiZIDE 5 MG tablet Commonly known as: GLUCOTROL TWO before breakfast and ONE and a HALF  tablets before Supper What changed:  how much to take how to take this when to take this additional instructions   ibuprofen 200 MG tablet Commonly known as: ADVIL Take 200 mg by mouth every 6 (six) hours as needed for headache or moderate pain.   loratadine 10 MG tablet Commonly known as: CLARITIN Take 10 mg by mouth daily as needed for allergies.   metFORMIN 500 MG 24 hr tablet Commonly known as: GLUCOPHAGE-XR TAKE 2 TABLETS BY MOUTH EVERY MORNING & AT BEDTIME   multivitamin with minerals Tabs tablet Take 1 tablet by mouth daily.   OVER THE COUNTER MEDICATION Apply 1 Application topically daily as needed (pain). CBD cream         OBJECTIVE:   Vital Signs: BP 134/80 (BP Location: Left Arm, Patient Position: Sitting, Cuff Size: Small)   Pulse 70   Ht '5\' 6"'$  (1.676 m)   Wt 188 lb (85.3 kg)   SpO2 96%   BMI 30.34 kg/m   Wt Readings from Last 3 Encounters:  09/30/22 188 lb (85.3 kg)  08/07/22 186 lb (84.4 kg)  07/24/22 193 lb 11.2 oz (87.9 kg)     Exam: General: Pt appears well and is in NAD  Lungs: Clear  with good BS bilat with no rales, rhonchi, or wheezes  Heart: RRR   Extremities: Trace pretibial edema.   Neuro: MS is good with appropriate affect, pt is alert and Ox3       DM foot exam: 03/10/2021   The skin of the feet is intact without sores or ulcerations. The pedal pulses are 2+ on right and 2+ on left. The sensation is intact to a screening 5.07, 10 gram monofilament bilaterally  DATA REVIEWED:  Lab Results  Component Value Date   HGBA1C 6.6 (A) 09/30/2022   HGBA1C 6.6 (H) 05/14/2022   HGBA1C 7.0 (A) 12/17/2021   Lab Results  Component Value Date   MICROALBUR 0.8 02/19/2014   LDLCALC 39 12/04/2021   CREATININE 0.80 09/21/2022   Lab Results  Component Value Date   MICRALBCREAT 0.3 02/19/2014     Lab Results  Component Value Date   CHOL 96 12/04/2021   HDL 43.00 12/04/2021   LDLCALC 39 12/04/2021   TRIG 73.0 12/04/2021   CHOLHDL 2 12/04/2021        Latest Reference Range & Units 09/30/22 11:35  Sodium 135 - 145 mEq/L 140  Potassium 3.5 - 5.1 mEq/L 3.8  Chloride 96 - 112 mEq/L 102  CO2 19 - 32 mEq/L 32  Glucose 70 - 99 mg/dL 183 (H)  BUN 6 - 23 mg/dL 18  Creatinine 0.40 - 1.50 mg/dL 0.80  Calcium 8.4 - 10.5 mg/dL 8.7  GFR >60.00 mL/min 83.86     ASSESSMENT / PLAN / RECOMMENDATIONS:   1) Type 2 Diabetes Mellitus, Optimally controlled, Without complications - Most recent A1c of 6.6%. Goal A1c < 7.5 %.     -A1c remains stable but he has been noted with hypoglycemia despite reducing his glipizide, patient has been noted with weight loss which could be an contributing factor to improving his insulin resistant -We have also discussed the possibility of switching to repaglinide in the future if needed due to shorter half-life - Will avoid GLp-1 agonists and DPP-4 inhibitors due to history of pancreatitis   MEDICATIONS: Decrease Glipizide 5 mg , half a tablet before breakfast Continue Metformin 500 mg, 2 tablet before Breakfast and 2 tablet before Supper     EDUCATION / INSTRUCTIONS: BG monitoring instructions: Patient is instructed to check his blood sugars 3 times a day, before meals . Call [facility] if: BG persistently < 70  I reviewed the Rule of 15 for the treatment of hypoglycemia in detail with the patient. Literature supplied.     2) Diabetic complications:  Eye: Does not have known diabetic retinopathy.  Neuro/ Feet: Does not have known diabetic peripheral neuropathy .  Renal: Patient does not have known baseline CKD. He   is  on an ACEI/ARB at present.      F/U in 6 months     Signed electronically by: Mack Guise, MD  [facility]  [facility] [address] [place], [place] [address] Phone: [phone] FAX: [phone]   CC: Laurey Morale, [place] [address] Phone: [phone]  Fax: [phone]  Return to Endocrinology clinic as below: Future Appointments  Date Time Provider [place]  10/05/2022  2:45 PM Danie Binder, PT [facility] None  12/03/2022  8:00 AM Laurey Morale, MD [facility]  07/14/2023  1:00 PM [place] Border [facility]

## 2022-09-30 NOTE — Patient Instructions (Signed)
-   Change  Glipizide 5 mg ,Half a tablet before Breakfast  - Continue Metformin 500 mg, 2 tablet before Breakfast and 2 tablet before Supper     HOW TO TREAT LOW BLOOD SUGARS (Blood sugar LESS THAN 70 MG/DL) Please follow the RULE OF 15 for the treatment of hypoglycemia treatment (when your (blood sugars are less than 70 mg/dL)   STEP 1: Take 15 grams of carbohydrates when your blood sugar is low, which includes:  3-4 GLUCOSE TABS  OR 3-4 OZ OF JUICE OR REGULAR SODA OR ONE TUBE OF GLUCOSE GEL    STEP 2: RECHECK blood sugar in 15 MINUTES STEP 3: If your blood sugar is still low at the 15 minute recheck --> then, go back to STEP 1 and treat AGAIN with another 15 grams of carbohydes

## 2022-10-05 ENCOUNTER — Other Ambulatory Visit: Payer: Self-pay

## 2022-10-05 ENCOUNTER — Ambulatory Visit: Payer: Medicare PPO

## 2022-10-05 ENCOUNTER — Ambulatory Visit: Payer: Medicare PPO | Attending: Neurosurgery

## 2022-10-05 DIAGNOSIS — M5459 Other low back pain: Secondary | ICD-10-CM | POA: Diagnosis not present

## 2022-10-05 DIAGNOSIS — M4310 Spondylolisthesis, site unspecified: Secondary | ICD-10-CM | POA: Diagnosis not present

## 2022-10-05 DIAGNOSIS — R252 Cramp and spasm: Secondary | ICD-10-CM | POA: Insufficient documentation

## 2022-10-05 DIAGNOSIS — R293 Abnormal posture: Secondary | ICD-10-CM | POA: Diagnosis not present

## 2022-10-05 NOTE — Therapy (Signed)
OUTPATIENT PHYSICAL THERAPY THORACOLUMBAR EVALUATION   Patient Name: Walter Campbell MRN: [id number] DOB:[birth year], 81 y.o., male Today's Date: 10/05/2022  END OF SESSION:  PT End of Session - 10/05/22 1531     Visit Number 1    Date for PT Re-Evaluation 11/30/22    Authorization Type [employer]- requested auth    Progress Note Due on Visit 10    PT Start Time 1446    PT Stop Time 1528    PT Time Calculation (min) 42 min    Activity Tolerance Patient tolerated treatment well    Behavior During Therapy [facility] for tasks assessed/performed             Past Medical History:  Diagnosis Date   Arthritis    Diabetes mellitus    Hypertension    Palpitations 06/10/2010   Pancreatitis    Pneumonia    renal ca 05/20/2022   Past Surgical History:  Procedure Laterality Date   APPENDECTOMY  1957   COLONOSCOPY  12/19/2018   per Dr. 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   TONSILLECTOMY     Patient Active Problem List   Diagnosis Date Noted   Right renal mass 05/20/2022   Type 2 diabetes mellitus without complication, without long-term current use of insulin ([place]) 03/10/2021   Piriformis syndrome of right side 04/26/2019   Anxiety state 11/27/2014   Hyperlipidemia 11/26/2014   GERD (gastroesophageal reflux disease) 11/14/2012   Type 2 diabetes mellitus without complications ([place]) 28/41/3244   Essential hypertension 12/04/2009    PCP: Alysia Penna, MD   REFERRING PROVIDER: Earnie Larsson, MD  REFERRING DIAG: M43.10 (ICD-10-CM) - Spondylolisthesis, site unspecified   Rationale  for Evaluation and Treatment: Rehabilitation  THERAPY DIAG:  Other low back pain - Plan: PT plan of care cert/re-cert  Cramp and spasm - Plan: PT plan of care cert/re-cert  Abnormal posture - Plan: PT plan of care cert/re-cert  ONSET DATE: 4-5 weeks   SUBJECTIVE:  SUBJECTIVE STATEMENT: Pt presents to PT with LBP and Lt LE pain that began 4-5 weeks ago.  Pt's wife requires full care and was transferring her up until 3 weeks ago when they got help with this.  Pt reports that he is now having only aching in his low back and Lt LE pain has resolved.    PERTINENT HISTORY:  DM, HTN, renal cancer (05/2022)  PAIN:  Are you having pain? Yes: NPRS scale: 1/10 up to 3/10  Pain location: central low back Pain description: aching  Aggravating factors: lifting, sitting too long Relieving factors: change of position, not lifting   PRECAUTIONS: Other: history of cancer   WEIGHT BEARING RESTRICTIONS: No  FALLS:  Has patient fallen in last 6 months? No  LIVING ENVIRONMENT: Lives with: lives with their spouse Lives in: House/apartment, has chair lift if needed  OCCUPATION: retired   PLOF: Independent and Leisure: golf 2x/wk, walk with wife  PATIENT GOALS: reduce LBP, prevent sciatic nerve pain from returning   NEXT MD VISIT: none   OBJECTIVE:   DIAGNOSTIC FINDINGS:  MRI: 08/16/22 IMPRESSION: 1. Transitional lumbosacral anatomy. 2. Degenerative changes of the lumbar spine, more pronounced at L5-S1 where there is moderate spinal canal stenosis with prominent narrowing of the bilateral subarticular zones and mild right neural foraminal narrowing. 3. Mild spinal canal stenosis with mild narrowing of the bilateral subarticular zones and mild bilateral neural foraminal narrowing at L3-4 and  L4-5.  PATIENT SURVEYS:  10/05/22: FOTO 67 (goal is 72)  SCREENING FOR RED FLAGS: Bowel or bladder incontinence: No Spinal tumors: No Cauda equina syndrome: No Compression fracture: No Abdominal aneurysm: No  COGNITION: Overall cognitive status: Within functional limits for tasks assessed     SENSATION: WFL  MUSCLE LENGTH: Limited by 50-60% bilaterally  POSTURE: rounded shoulders, forward head, decreased lumbar lordosis, and flexed trunk   PALPATION: Reduced segmental mobility, tension in bil lumbar musculature and gluteals   LUMBAR ROM:  Lumbar A/ROM limited by 50% with limited segmental mobility in the lumbar spine  LOWER EXTREMITY ROM:     Hip IR limited by 50-60%, hamstring length limited by 50-60%, all other hip flexibility limited by 50%.    LOWER EXTREMITY MMT:    Hips 4/5, knees 4+/5 (limited knee extension due to shortened hamstrings)   LUMBAR SPECIAL TESTS:  Straight leg raise test: Negative  FUNCTIONAL TESTS:  Sit to stand without UE support  GAIT: Distance walked: 50 Assistive device utilized: None Level of assistance: Modified independence Comments: flexed trunk, wide base of support   TODAY'S TREATMENT:                                                                                    DATE: 10/05/22    PATIENT EDUCATION:  Education details: Access Code: [email or web address] Person educated: Patient Education method: Explanation, Demonstration, and Handouts Education comprehension: verbalized understanding and returned demonstration  HOME EXERCISE PROGRAM: Access Code: [facility] URL: [email or web address] Date: 10/05/2022 Prepared by: Claiborne Billings  Exercises - Seated Hamstring Stretch  - 3 x daily - 7 x weekly - 1 sets - 3 reps - 20 hold - Hooklying Single Knee to Chest Stretch  - 3  x daily - 7 x weekly - 1 sets - 3 reps - 20 hold - Supine Lower Trunk Rotation  - 3 x daily - 7 x weekly - 1 sets - 3 reps - 2 hold  ASSESSMENT:  CLINICAL  IMPRESSION: Patient is a 81 y.o. male who was seen today for physical therapy evaluation and treatment for LBP. Pt reports that he had Lt LE radiculopathy until about 3 weeks ago due to assisting with transfers with his wife who requires total care.  He now has assistance with this and leg pain has resolved.  MRI showed lumbar stenosis and pt was going to have injection and canceled this due to reduced symptoms.  Pt with flexed spine posture, fixed lumbar segments, reduced hip flexibility bilaterally and tension in bil lumbar paraspinals.  Patient will benefit from skilled PT to address the below impairments and improve overall function.   OBJECTIVE IMPAIRMENTS: decreased activity tolerance, decreased balance, decreased endurance, difficulty walking, decreased ROM, decreased strength, hypomobility, increased muscle spasms, impaired flexibility, improper body mechanics, postural dysfunction, and pain.   ACTIVITY LIMITATIONS: sitting, standing, transfers, locomotion level, and caring for others  PARTICIPATION LIMITATIONS: meal prep, cleaning, laundry, shopping, and community activity  PERSONAL FACTORS: Age, Social background, and 1-2 comorbidities: lumbar DDD, cares for wife  are also affecting patient's functional outcome.   REHAB POTENTIAL: Good  CLINICAL DECISION MAKING: Evolving/moderate complexity  EVALUATION COMPLEXITY: Moderate   GOALS: Goals reviewed with patient? Yes  SHORT TERM GOALS: Target date: 11/03/2022   Be independent in initial HEP Baseline: Goal status: INITIAL  2.  Report > or = to 30% reduction in LBP with standing and walking  Baseline:  Goal status: INITIAL  3.  Initiate return to regular exercise routine including pilates  Baseline:  Goal status: INITIAL    LONG TERM GOALS: Target date: 11/30/2022    Be independent in advanced HEP Baseline:  Goal status: INITIAL  2.  Improve FOTO to > or = to 72 Baseline: 67 Goal status: INITIAL  3.  Report > or =  to 70% reduction in LBP with standing and walking  Baseline:  Goal status: INITIAL  4.  Verbalize and demonstrate body mechanics modifications with lifting and carrying for lumbar protection Baseline:  Goal status: INITIAL  5.  Perform exercise routine regularly and verbalize modifications for safe progression Baseline:  Goal status: INITIAL   PLAN:  PT FREQUENCY: 2x/week  PT DURATION: 8 weeks  PLANNED INTERVENTIONS: Therapeutic exercises, Therapeutic activity, Neuromuscular re-education, Balance training, Gait training, Patient/Family education, Self Care, Joint mobilization, Aquatic Therapy, Dry Needling, Spinal mobilization, Cryotherapy, Moist heat, Taping, Traction, Manual therapy, and Re-evaluation.  PLAN FOR NEXT SESSION: review HEP, body mechanics education, work on balance, hip flexibility, core strength, spinal segmental mobility.    Sigurd Sos, PT 10/05/22 3:49 PM   [facility] [address], [place], [place] [address] Phone # [phone] Fax [phone]

## 2022-10-06 DIAGNOSIS — K861 Other chronic pancreatitis: Secondary | ICD-10-CM | POA: Diagnosis not present

## 2022-10-06 DIAGNOSIS — R197 Diarrhea, unspecified: Secondary | ICD-10-CM | POA: Diagnosis not present

## 2022-10-08 ENCOUNTER — Ambulatory Visit: Payer: Medicare PPO

## 2022-10-08 DIAGNOSIS — R293 Abnormal posture: Secondary | ICD-10-CM | POA: Diagnosis not present

## 2022-10-08 DIAGNOSIS — M4310 Spondylolisthesis, site unspecified: Secondary | ICD-10-CM | POA: Diagnosis not present

## 2022-10-08 DIAGNOSIS — M5459 Other low back pain: Secondary | ICD-10-CM

## 2022-10-08 DIAGNOSIS — R252 Cramp and spasm: Secondary | ICD-10-CM

## 2022-10-08 NOTE — Therapy (Signed)
OUTPATIENT PHYSICAL THERAPY TREATMENT   Patient Name: Walter Campbell MRN: [id number] DOB:10/06/1941, 81 y.o., male Today's Date: 10/08/2022  END OF SESSION:  PT End of Session - 10/08/22 1225     Visit Number 2    Date for PT Re-Evaluation 11/30/22    Authorization Type 16 visitis 10/05/22-11/30/22    Authorization - Visit Number 2    Authorization - Number of Visits 16    Progress Note Due on Visit 10    PT Start Time 1146    PT Stop Time 7062    PT Time Calculation (min) 38 min    Activity Tolerance Patient tolerated treatment well    Behavior During Therapy [facility] for tasks assessed/performed              Past Medical History:  Diagnosis Date   Arthritis    Diabetes mellitus    Hypertension    Palpitations 06/10/2010   Pancreatitis    Pneumonia    renal ca 05/20/2022   Past Surgical History:  Procedure Laterality Date   APPENDECTOMY  1957   COLONOSCOPY  12/19/2018   per Dr. 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   TONSILLECTOMY     Patient Active Problem List   Diagnosis Date Noted   Right renal mass 05/20/2022   Type 2 diabetes mellitus without complication, without long-term current use of insulin ([place]) 03/10/2021   Piriformis syndrome of right side 04/26/2019   Anxiety state 11/27/2014   Hyperlipidemia 11/26/2014   GERD (gastroesophageal reflux disease) 11/14/2012   Type 2 diabetes mellitus without complications ([place]) 37/62/8315   Essential hypertension 12/04/2009    PCP: Alysia Penna, MD   REFERRING PROVIDER: Earnie Larsson, MD  REFERRING DIAG:  M43.10 (ICD-10-CM) - Spondylolisthesis, site unspecified   Rationale for Evaluation and Treatment: Rehabilitation  THERAPY DIAG:  Other low back pain  Cramp and spasm  Abnormal posture  ONSET DATE: 4-5 weeks   SUBJECTIVE:                                                                                                                                                                                           SUBJECTIVE  STATEMENT: I am feeling better after doing the exercises.  I lost my handouts but I remembered what to do.    PERTINENT HISTORY:  DM, HTN, renal cancer (05/2022)  PAIN:  Are you having pain? Yes: NPRS scale: 1-2/10  Pain location: central low back Pain description: aching  Aggravating factors: lifting, sitting too long Relieving factors: change of position, not lifting   PRECAUTIONS: Other: history of cancer   WEIGHT BEARING RESTRICTIONS: No  FALLS:  Has patient fallen in last 6 months? No  LIVING ENVIRONMENT: Lives with: lives with their spouse Lives in: House/apartment, has chair lift if needed  OCCUPATION: retired   PLOF: Independent and Leisure: golf 2x/wk, walk with wife  PATIENT GOALS: reduce LBP, prevent sciatic nerve pain from returning   NEXT MD VISIT: none   OBJECTIVE:   DIAGNOSTIC FINDINGS:  MRI: 08/16/22 IMPRESSION: 1. Transitional lumbosacral anatomy. 2. Degenerative changes of the lumbar spine, more pronounced at L5-S1 where there is moderate spinal canal stenosis with prominent narrowing of the bilateral subarticular zones and mild right neural foraminal narrowing. 3. Mild spinal canal stenosis with mild narrowing of the bilateral subarticular zones and mild bilateral neural foraminal narrowing at L3-4 and L4-5.  PATIENT SURVEYS:  10/05/22: FOTO 67 (goal is 72)  SCREENING FOR RED FLAGS: Bowel or bladder incontinence: No Spinal tumors: No Cauda equina syndrome: No Compression fracture: No Abdominal aneurysm:  No  COGNITION: Overall cognitive status: Within functional limits for tasks assessed     SENSATION: WFL  MUSCLE LENGTH: Limited by 50-60% bilaterally  POSTURE: rounded shoulders, forward head, decreased lumbar lordosis, and flexed trunk   PALPATION: Reduced segmental mobility, tension in bil lumbar musculature and gluteals   LUMBAR ROM:  Lumbar A/ROM limited by 50% with limited segmental mobility in the lumbar spine  LOWER EXTREMITY ROM:     Hip IR limited by 50-60%, hamstring length limited by 50-60%, all other hip flexibility limited by 50%.    LOWER EXTREMITY MMT:    Hips 4/5, knees 4+/5 (limited knee extension due to shortened hamstrings)   LUMBAR SPECIAL TESTS:  Straight leg raise test: Negative  FUNCTIONAL TESTS:  Sit to stand without UE support  GAIT: Distance walked: 50 Assistive device utilized: None Level of assistance: Modified independence Comments: flexed trunk, wide base of support  TODAY'S TREATMENT:                                                                                    DATE: 10/08/22  Seated hamstring stretch: 3x20 seconds  Low trunk rotation 3x20 seconds  Knee to chest 3x20 seconds  TA activation with ball squeeze in hooklying: 5" hold x 10  Sidelying clam shell x10  Seated piriformis stretch 3x20 seconds  NuStep Level 2x 8 minutes- PT monitored for pain  TODAY'S TREATMENT:  DATE: 10/05/22  HEP established- see below  PATIENT EDUCATION:  Education details: Access Code: [email or web address] Person educated: Patient Education method: Explanation, Demonstration, and Handouts Education comprehension: verbalized understanding and returned demonstration  HOME EXERCISE PROGRAM: Access Code: [facility] URL: [email or web address] Date: 10/08/2022 Prepared by: Claiborne Billings  Exercises - Seated Hamstring Stretch  - 3 x daily - 7 x weekly - 1 sets - 3 reps - 20  hold - Hooklying Single Knee to Chest Stretch  - 3 x daily - 7 x weekly - 1 sets - 3 reps - 20 hold - Supine Lower Trunk Rotation  - 3 x daily - 7 x weekly - 1 sets - 3 reps - 2 hold - Seated Figure 4 Piriformis Stretch  - 3 x daily - 7 x weekly - 1 sets - 3 reps - 20 hold - Clamshell  - 1 x daily - 7 x weekly - 1-2 sets - 10 reps - Supine Hip Adduction Isometric with Ball  - 1 x daily - 7 x weekly - 1-2 sets - 10 reps - 5 hold  ASSESSMENT:  CLINICAL IMPRESSION: First time follow-up after evaluation.  Pt reports some reduction in LBP since starting the exercises.  He had lost his handouts so wasn't holding the stretches 20 seconds. He did well with increased hold time today. PT educated on TA activation with ball squeeze and with activity.  Pt is familiar with this as he was active with Pilates for many years.  Patient will benefit from skilled PT to address the below impairments and improve overall function.   OBJECTIVE IMPAIRMENTS: decreased activity tolerance, decreased balance, decreased endurance, difficulty walking, decreased ROM, decreased strength, hypomobility, increased muscle spasms, impaired flexibility, improper body mechanics, postural dysfunction, and pain.   ACTIVITY LIMITATIONS: sitting, standing, transfers, locomotion level, and caring for others  PARTICIPATION LIMITATIONS: meal prep, cleaning, laundry, shopping, and community activity  PERSONAL FACTORS: Age, Social background, and 1-2 comorbidities: lumbar DDD, cares for wife  are also affecting patient's functional outcome.   REHAB POTENTIAL: Good  CLINICAL DECISION MAKING: Evolving/moderate complexity  EVALUATION COMPLEXITY: Moderate   GOALS: Goals reviewed with patient? Yes  SHORT TERM GOALS: Target date: 11/03/2022   Be independent in initial HEP Baseline: Goal status: INITIAL  2.  Report > or = to 30% reduction in LBP with standing and walking  Baseline:  Goal status: INITIAL  3.  Initiate return to  regular exercise routine including pilates  Baseline:  Goal status: INITIAL    LONG TERM GOALS: Target date: 11/30/2022    Be independent in advanced HEP Baseline:  Goal status: INITIAL  2.  Improve FOTO to > or = to 72 Baseline: 67 Goal status: INITIAL  3.  Report > or = to 70% reduction in LBP with standing and walking  Baseline:  Goal status: INITIAL  4.  Verbalize and demonstrate body mechanics modifications with lifting and carrying for lumbar protection Baseline:  Goal status: INITIAL  5.  Perform exercise routine regularly and verbalize modifications for safe progression Baseline:  Goal status: INITIAL   PLAN:  PT FREQUENCY: 2x/week  PT DURATION: 8 weeks  PLANNED INTERVENTIONS: Therapeutic exercises, Therapeutic activity, Neuromuscular re-education, Balance training, Gait training, Patient/Family education, Self Care, Joint mobilization, Aquatic Therapy, Dry Needling, Spinal mobilization, Cryotherapy, Moist heat, Taping, Traction, Manual therapy, and Re-evaluation.  PLAN FOR NEXT SESSION: review HEP, body mechanics education, work on balance, hip flexibility, core strength, spinal segmental mobility.    Sigurd Sos, PT 10/08/22 12:26 PM  [facility] [address] [place], [place] [address] Phone # [phone] Fax [phone]

## 2022-10-09 DIAGNOSIS — R197 Diarrhea, unspecified: Secondary | ICD-10-CM | POA: Diagnosis not present

## 2022-10-15 ENCOUNTER — Ambulatory Visit: Payer: Medicare PPO | Attending: Neurosurgery

## 2022-10-15 DIAGNOSIS — R293 Abnormal posture: Secondary | ICD-10-CM | POA: Diagnosis not present

## 2022-10-15 DIAGNOSIS — M5459 Other low back pain: Secondary | ICD-10-CM | POA: Insufficient documentation

## 2022-10-15 DIAGNOSIS — D49511 Neoplasm of unspecified behavior of right kidney: Secondary | ICD-10-CM | POA: Diagnosis not present

## 2022-10-15 DIAGNOSIS — N401 Enlarged prostate with lower urinary tract symptoms: Secondary | ICD-10-CM | POA: Diagnosis not present

## 2022-10-15 DIAGNOSIS — R252 Cramp and spasm: Secondary | ICD-10-CM | POA: Insufficient documentation

## 2022-10-15 DIAGNOSIS — R351 Nocturia: Secondary | ICD-10-CM | POA: Diagnosis not present

## 2022-10-15 NOTE — Therapy (Signed)
OUTPATIENT PHYSICAL THERAPY TREATMENT   Patient Name: Walter Campbell MRN: [id number] DOB:February 16, 1941, 81 y.o., male Today's Date: 10/15/2022  END OF SESSION:  PT End of Session - 10/15/22 0929     Visit Number 3    Date for PT Re-Evaluation 11/30/22    Authorization Type 16 visitis 10/05/22-11/30/22    Authorization - Visit Number 3    Authorization - Number of Visits 16    Progress Note Due on Visit 10    PT Start Time 8315    PT Stop Time 0927    PT Time Calculation (min) 40 min    Activity Tolerance Patient tolerated treatment well    Behavior During Therapy [facility] for tasks assessed/performed              Past Medical History:  Diagnosis Date   Arthritis    Diabetes mellitus    Hypertension    Palpitations 06/10/2010   Pancreatitis    Pneumonia    renal ca 05/20/2022   Past Surgical History:  Procedure Laterality Date   APPENDECTOMY  1957   COLONOSCOPY  12/19/2018   per Dr. 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   TONSILLECTOMY     Patient Active Problem List   Diagnosis Date Noted   Right renal mass 05/20/2022   Type 2 diabetes mellitus without complication, without long-term current use of insulin ([place]) 03/10/2021   Piriformis syndrome of right side 04/26/2019   Anxiety state 11/27/2014   Hyperlipidemia 11/26/2014   GERD (gastroesophageal reflux disease) 11/14/2012   Type 2 diabetes mellitus without complications ([place]) 17/61/6073   Essential hypertension 12/04/2009    PCP: Alysia Penna, MD   REFERRING PROVIDER: Earnie Larsson, MD  REFERRING DIAG:  M43.10 (ICD-10-CM) - Spondylolisthesis, site unspecified   Rationale for Evaluation and Treatment: Rehabilitation  THERAPY DIAG:  Other low back pain  Cramp and spasm  Abnormal posture  ONSET DATE: 4-5 weeks   SUBJECTIVE:                                                                                                                                                                                           SUBJECTIVE  STATEMENT: I have been having some mild LBP.  I think I am doing my exercises the right way.     PERTINENT HISTORY:  DM, HTN, renal cancer (05/2022)  PAIN:  Are you having pain? Yes: NPRS scale: 1-2/10  Pain location: central low back Pain description: aching  Aggravating factors: lifting, sitting too long Relieving factors: change of position, not lifting   PRECAUTIONS: Other: history of cancer   WEIGHT BEARING RESTRICTIONS: No  FALLS:  Has patient fallen in last 6 months? No  LIVING ENVIRONMENT: Lives with: lives with their spouse Lives in: House/apartment, has chair lift if needed  OCCUPATION: retired   PLOF: Independent and Leisure: golf 2x/wk, walk with wife  PATIENT GOALS: reduce LBP, prevent sciatic nerve pain from returning   NEXT MD VISIT: none   OBJECTIVE:   DIAGNOSTIC FINDINGS:  MRI: 08/16/22 IMPRESSION: 1. Transitional lumbosacral anatomy. 2. Degenerative changes of the lumbar spine, more pronounced at L5-S1 where there is moderate spinal canal stenosis with prominent narrowing of the bilateral subarticular zones and mild right neural foraminal narrowing. 3. Mild spinal canal stenosis with mild narrowing of the bilateral subarticular zones and mild bilateral neural foraminal narrowing at L3-4 and L4-5.  PATIENT SURVEYS:  10/05/22: FOTO 67 (goal is 72)  SCREENING FOR RED FLAGS: Bowel or bladder incontinence: No Spinal tumors: No Cauda equina syndrome: No Compression fracture: No Abdominal aneurysm: No  COGNITION: Overall  cognitive status: Within functional limits for tasks assessed     SENSATION: WFL  MUSCLE LENGTH: Limited by 50-60% bilaterally  POSTURE: rounded shoulders, forward head, decreased lumbar lordosis, and flexed trunk   PALPATION: Reduced segmental mobility, tension in bil lumbar musculature and gluteals   LUMBAR ROM:  Lumbar A/ROM limited by 50% with limited segmental mobility in the lumbar spine  LOWER EXTREMITY ROM:    Hip IR limited by 50-60%, hamstring length limited by 50-60%, all other hip flexibility limited by 50%.    LOWER EXTREMITY MMT:    Hips 4/5, knees 4+/5 (limited knee extension due to shortened hamstrings)   LUMBAR SPECIAL TESTS:  Straight leg raise test: Negative  FUNCTIONAL TESTS:  Sit to stand without UE support  GAIT: Distance walked: 50 Assistive device utilized: None Level of assistance: Modified independence Comments: flexed trunk, wide base of support  TODAY'S TREATMENT:                                                                                    DATE: 10/15/22  Seated hamstring stretch: 3x20 seconds  Low trunk rotation 3x20 seconds  Knee to chest 3x20 seconds  TA activation with ball squeeze in hooklying: 5" hold x 10  Supine clam with blue loop x20 and TA activation Seated piriformis stretch 3x20 seconds  NuStep Level 2x 8 minutes- PT monitored for pain Manual: addaday to bil low back and gluteals x 10 min  TODAY'S TREATMENT:  DATE: 10/08/22  Seated hamstring stretch: 3x20 seconds  Low trunk rotation 3x20 seconds  Knee to chest 3x20 seconds  TA activation with ball squeeze in hooklying: 5" hold x 10  Sidelying clam shell x10  NuStep Level 2x 8 minutes- PT monitored for pain  TODAY'S TREATMENT:                                                                                    DATE: 10/05/22  HEP established- see below  PATIENT EDUCATION:  Education  details: Access Code: [email or web address] Person educated: Patient Education method: Explanation, Demonstration, and Handouts Education comprehension: verbalized understanding and returned demonstration  HOME EXERCISE PROGRAM: Access Code: [facility] URL: [email or web address] Date: 10/08/2022 Prepared by: Claiborne Billings  Exercises - Seated Hamstring Stretch  - 3 x daily - 7 x weekly - 1 sets - 3 reps - 20 hold - Hooklying Single Knee to Chest Stretch  - 3 x daily - 7 x weekly - 1 sets - 3 reps - 20 hold - Supine Lower Trunk Rotation  - 3 x daily - 7 x weekly - 1 sets - 3 reps - 2 hold - Seated Figure 4 Piriformis Stretch  - 3 x daily - 7 x weekly - 1 sets - 3 reps - 20 hold - Clamshell  - 1 x daily - 7 x weekly - 1-2 sets - 10 reps - Supine Hip Adduction Isometric with Ball  - 1 x daily - 7 x weekly - 1-2 sets - 10 reps - 5 hold  ASSESSMENT:  CLINICAL IMPRESSION: Pt has been compliant with HEP and reports 1-2/10 pain in the lumbar spine and gluteals.  Pt did well with all exercises in the clinic and PT monitored for pain and technique.  Patient will benefit from skilled PT to address the below impairments and improve overall function.   OBJECTIVE IMPAIRMENTS: decreased activity tolerance, decreased balance, decreased endurance, difficulty walking, decreased ROM, decreased strength, hypomobility, increased muscle spasms, impaired flexibility, improper body mechanics, postural dysfunction, and pain.   ACTIVITY LIMITATIONS: sitting, standing, transfers, locomotion level, and caring for others  PARTICIPATION LIMITATIONS: meal prep, cleaning, laundry, shopping, and community activity  PERSONAL FACTORS: Age, Social background, and 1-2 comorbidities: lumbar DDD, cares for wife  are also affecting patient's functional outcome.   REHAB POTENTIAL: Good  CLINICAL DECISION MAKING: Evolving/moderate complexity  EVALUATION COMPLEXITY: Moderate   GOALS: Goals reviewed with patient? Yes  SHORT  TERM GOALS: Target date: 11/03/2022   Be independent in initial HEP Baseline: Goal status: INITIAL  2.  Report > or = to 30% reduction in LBP with standing and walking  Baseline:  Goal status: INITIAL  3.  Initiate return to regular exercise routine including pilates  Baseline: doing stretches consistently (10/15/22) Goal status: in progress    LONG TERM GOALS: Target date: 11/30/2022    Be independent in advanced HEP Baseline:  Goal status: INITIAL  2.  Improve FOTO to > or = to 72 Baseline: 67 Goal status: INITIAL  3.  Report > or = to 70% reduction in LBP with standing and walking  Baseline:  Goal status: INITIAL  4.  Verbalize and demonstrate body mechanics  modifications with lifting and carrying for lumbar protection Baseline:  Goal status: INITIAL  5.  Perform exercise routine regularly and verbalize modifications for safe progression Baseline:  Goal status: INITIAL   PLAN:  PT FREQUENCY: 2x/week  PT DURATION: 8 weeks  PLANNED INTERVENTIONS: Therapeutic exercises, Therapeutic activity, Neuromuscular re-education, Balance training, Gait training, Patient/Family education, Self Care, Joint mobilization, Aquatic Therapy, Dry Needling, Spinal mobilization, Cryotherapy, Moist heat, Taping, Traction, Manual therapy, and Re-evaluation.  PLAN FOR NEXT SESSION:  body mechanics education, work on balance, hip flexibility, core strength, spinal segmental mobility.    Sigurd Sos, PT 10/15/22 9:31 AM   [facility] [address] [place], [place] [address] Phone # [phone] Fax [phone]

## 2022-10-15 NOTE — Patient Instructions (Signed)

## 2022-10-19 ENCOUNTER — Encounter: Payer: Self-pay | Admitting: Physical Therapy

## 2022-10-19 ENCOUNTER — Ambulatory Visit: Payer: Medicare PPO | Admitting: Physical Therapy

## 2022-10-19 DIAGNOSIS — M5459 Other low back pain: Secondary | ICD-10-CM | POA: Diagnosis not present

## 2022-10-19 DIAGNOSIS — R252 Cramp and spasm: Secondary | ICD-10-CM

## 2022-10-19 DIAGNOSIS — R293 Abnormal posture: Secondary | ICD-10-CM | POA: Diagnosis not present

## 2022-10-19 NOTE — Therapy (Signed)
OUTPATIENT PHYSICAL THERAPY TREATMENT   Patient Name: Walter Campbell MRN: [id number] DOB:22-Feb-1941, 81 y.o., male Today's Date: 10/19/2022  END OF SESSION:  PT End of Session - 10/19/22 1358     Visit Number 4    Date for PT Re-Evaluation 11/30/22    Authorization Type 16 visitis 10/05/22-11/30/22    Authorization - Visit Number 4    Authorization - Number of Visits 16    Progress Note Due on Visit 10    PT Start Time 5701    PT Stop Time 1440    PT Time Calculation (min) 42 min    Activity Tolerance Patient tolerated treatment well    Behavior During Therapy [facility] for tasks assessed/performed              Past Medical History:  Diagnosis Date   Arthritis    Diabetes mellitus    Hypertension    Palpitations 06/10/2010   Pancreatitis    Pneumonia    renal ca 05/20/2022   Past Surgical History:  Procedure Laterality Date   APPENDECTOMY  1957   COLONOSCOPY  12/19/2018   per Dr. 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   TONSILLECTOMY     Patient Active Problem List   Diagnosis Date Noted   Right renal mass 05/20/2022   Type 2 diabetes mellitus without complication, without long-term current use of insulin ([place]) 03/10/2021   Piriformis syndrome of right side 04/26/2019   Anxiety state 11/27/2014   Hyperlipidemia 11/26/2014   GERD (gastroesophageal reflux disease) 11/14/2012   Type 2 diabetes mellitus without complications ([place]) 77/93/9030   Essential hypertension 12/04/2009    PCP: Alysia Penna, MD   REFERRING PROVIDER: Earnie Larsson, MD  REFERRING DIAG:  M43.10 (ICD-10-CM) - Spondylolisthesis, site unspecified   Rationale for Evaluation and Treatment: Rehabilitation  THERAPY DIAG:  Other low back pain  Cramp and spasm  Abnormal posture  ONSET DATE: 4-5 weeks   SUBJECTIVE:                                                                                                                                                                                           SUBJECTIVE  STATEMENT: My back is doing great but my hip is hurting from a golf injury a few weeks ago.   PERTINENT HISTORY:  DM, HTN, renal cancer (05/2022)  PAIN:  Are you having pain? Yes: NPRS scale: 4/10  Pain location: Rt hip Pain description: aching  Aggravating factors: lifting, sitting too long Relieving factors: change of position, not lifting   PRECAUTIONS: Other: history of cancer   WEIGHT BEARING RESTRICTIONS: No  FALLS:  Has patient fallen in last 6 months? No  LIVING ENVIRONMENT: Lives with: lives with their spouse Lives in: House/apartment, has chair lift if needed  OCCUPATION: retired   PLOF: Independent and Leisure: golf 2x/wk, walk with wife  PATIENT GOALS: reduce LBP, prevent sciatic nerve pain from returning   NEXT MD VISIT: none   OBJECTIVE:   DIAGNOSTIC FINDINGS:  MRI: 08/16/22 IMPRESSION: 1. Transitional lumbosacral anatomy. 2. Degenerative changes of the lumbar spine, more pronounced at L5-S1 where there is moderate spinal canal stenosis with prominent narrowing of the bilateral subarticular zones and mild right neural foraminal narrowing. 3. Mild spinal canal stenosis with mild narrowing of the bilateral subarticular zones and mild bilateral neural foraminal narrowing at L3-4 and L4-5.  PATIENT SURVEYS:  10/05/22: FOTO 67 (goal is 72)  SCREENING FOR RED FLAGS: Bowel or bladder incontinence: No Spinal tumors: No Cauda equina syndrome: No Compression fracture: No Abdominal aneurysm: No  COGNITION: Overall cognitive status:  Within functional limits for tasks assessed     SENSATION: WFL  MUSCLE LENGTH: Limited by 50-60% bilaterally  POSTURE: rounded shoulders, forward head, decreased lumbar lordosis, and flexed trunk   PALPATION: Reduced segmental mobility, tension in bil lumbar musculature and gluteals   LUMBAR ROM:  Lumbar A/ROM limited by 50% with limited segmental mobility in the lumbar spine  LOWER EXTREMITY ROM:    Hip IR limited by 50-60%, hamstring length limited by 50-60%, all other hip flexibility limited by 50%.    LOWER EXTREMITY MMT:    Hips 4/5, knees 4+/5 (limited knee extension due to shortened hamstrings)   LUMBAR SPECIAL TESTS:  Straight leg raise test: Negative  FUNCTIONAL TESTS:  Sit to stand without UE support  GAIT: Distance walked: 50 Assistive device utilized: None Level of assistance: Modified independence Comments: flexed trunk, wide base of support  TODAY'S TREATMENT:     10/19/22:  Seated hamstring stretch: 3x20 seconds  Low trunk rotation 3x20 seconds  Knee to chest 3x20 seconds  TA activation with ball squeeze in hooklying: 5" hold x 10  Supine clam with yellow loop 2x15 and TA activation Supine piriformis stretch 3x20 seconds  Supine bridge 10x NuStep Level 3x10 minutes- PTA monitored for pain Manual: addaday to bil low back and gluteals x 10 min     DATE: 10/15/22  Seated hamstring stretch: 3x20 seconds  Low trunk rotation 3x20 seconds  Knee to chest 3x20 seconds  TA activation with ball squeeze in hooklying: 5" hold x 10  Supine clam with blue loop x20 and TA activation Seated piriformis stretch 3x20 seconds  NuStep Level 2x 8 minutes- PT monitored for pain Manual: addaday to bil low back and gluteals x 10 min  TODAY'S TREATMENT:  DATE: 10/08/22  Seated hamstring stretch: 3x20 seconds  Low trunk rotation 3x20 seconds  Knee to chest 3x20 seconds  TA activation with  ball squeeze in hooklying: 5" hold x 10  Sidelying clam shell x10  NuStep Level 2x 8 minutes- PT monitored for pain  TODAY'S TREATMENT:                                                                                    DATE: 10/05/22  HEP established- see below  PATIENT EDUCATION:  Education details: Access Code: [email or web address] Person educated: Patient Education method: Explanation, Demonstration, and Handouts Education comprehension: verbalized understanding and returned demonstration  HOME EXERCISE PROGRAM: Access Code: [facility] URL: [email or web address] Date: 10/08/2022 Prepared by: Claiborne Billings  Exercises - Seated Hamstring Stretch  - 3 x daily - 7 x weekly - 1 sets - 3 reps - 20 hold - Hooklying Single Knee to Chest Stretch  - 3 x daily - 7 x weekly - 1 sets - 3 reps - 20 hold - Supine Lower Trunk Rotation  - 3 x daily - 7 x weekly - 1 sets - 3 reps - 2 hold - Seated Figure 4 Piriformis Stretch  - 3 x daily - 7 x weekly - 1 sets - 3 reps - 20 hold - Clamshell  - 1 x daily - 7 x weekly - 1-2 sets - 10 reps - Supine Hip Adduction Isometric with Ball  - 1 x daily - 7 x weekly - 1-2 sets - 10 reps - 5 hold  ASSESSMENT:  CLINICAL IMPRESSION: Pt arrives with no back pain and reports it is doing very well. His main complaint is his right buttocks hurting from what he thinks was a strain while playing golf. Stretching appears to help this.   OBJECTIVE IMPAIRMENTS: decreased activity tolerance, decreased balance, decreased endurance, difficulty walking, decreased ROM, decreased strength, hypomobility, increased muscle spasms, impaired flexibility, improper body mechanics, postural dysfunction, and pain.   ACTIVITY LIMITATIONS: sitting, standing, transfers, locomotion level, and caring for others  PARTICIPATION LIMITATIONS: meal prep, cleaning, laundry, shopping, and community activity  PERSONAL FACTORS: Age, Social background, and 1-2 comorbidities: lumbar DDD, cares for wife  are  also affecting patient's functional outcome.   REHAB POTENTIAL: Good  CLINICAL DECISION MAKING: Evolving/moderate complexity  EVALUATION COMPLEXITY: Moderate   GOALS: Goals reviewed with patient? Yes  SHORT TERM GOALS: Target date: 11/03/2022   Be independent in initial HEP Baseline: Goal status: INITIAL  2.  Report > or = to 30% reduction in LBP with standing and walking  Baseline:  Goal status: INITIAL  3.  Initiate return to regular exercise routine including pilates  Baseline: doing stretches consistently (10/15/22) Goal status: in progress    LONG TERM GOALS: Target date: 11/30/2022    Be independent in advanced HEP Baseline:  Goal status: INITIAL  2.  Improve FOTO to > or = to 72 Baseline: 67 Goal status: INITIAL  3.  Report > or = to 70% reduction in LBP with standing and walking  Baseline:  Goal status: INITIAL  4.  Verbalize and demonstrate body mechanics modifications with lifting and carrying for lumbar protection Baseline:  Goal status: INITIAL  5.  Perform exercise routine regularly and verbalize modifications for safe progression Baseline:  Goal status: INITIAL   PLAN:  PT FREQUENCY: 2x/week  PT DURATION: 8 weeks  PLANNED INTERVENTIONS: Therapeutic exercises, Therapeutic activity, Neuromuscular re-education, Balance training, Gait training, Patient/Family education, Self Care, Joint mobilization, Aquatic Therapy, Dry Needling, Spinal mobilization, Cryotherapy, Moist heat, Taping, Traction, Manual therapy, and Re-evaluation.  PLAN FOR NEXT SESSION:  body mechanics education, work on balance, hip flexibility, core strength, spinal segmental mobility.    Myrene Galas, PTA 10/19/22 2:41 PM   [facility] [address] [place], [place] [address] Phone # [phone] Fax [phone]

## 2022-10-26 ENCOUNTER — Encounter: Payer: Self-pay | Admitting: Physical Therapy

## 2022-10-26 ENCOUNTER — Ambulatory Visit: Payer: Medicare PPO | Admitting: Physical Therapy

## 2022-10-26 DIAGNOSIS — M5459 Other low back pain: Secondary | ICD-10-CM

## 2022-10-26 DIAGNOSIS — R252 Cramp and spasm: Secondary | ICD-10-CM

## 2022-10-26 DIAGNOSIS — R293 Abnormal posture: Secondary | ICD-10-CM

## 2022-10-26 NOTE — Therapy (Signed)
OUTPATIENT PHYSICAL THERAPY TREATMENT   Patient Name: Walter Campbell MRN: [id number] DOB:1941-03-14, 81 y.o., male Today's Date: 10/26/2022  END OF SESSION:  PT End of Session - 10/26/22 0844     Visit Number 5    Date for PT Re-Evaluation 11/30/22    Authorization Type 16 visitis 10/05/22-11/30/22    Authorization - Visit Number 5    Authorization - Number of Visits 16    Progress Note Due on Visit 10    PT Start Time 0843    PT Stop Time 0923    PT Time Calculation (min) 40 min    Activity Tolerance Patient tolerated treatment well    Behavior During Therapy [facility] for tasks assessed/performed              Past Medical History:  Diagnosis Date   Arthritis    Diabetes mellitus    Hypertension    Palpitations 06/10/2010   Pancreatitis    Pneumonia    renal ca 05/20/2022   Past Surgical History:  Procedure Laterality Date   APPENDECTOMY  1957   COLONOSCOPY  12/19/2018   per Dr. 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   TONSILLECTOMY     Patient Active Problem List   Diagnosis Date Noted   Right renal mass 05/20/2022   Type 2 diabetes mellitus without complication, without long-term current use of insulin ([place]) 03/10/2021   Piriformis syndrome of right side 04/26/2019   Anxiety state 11/27/2014   Hyperlipidemia 11/26/2014   GERD (gastroesophageal reflux disease) 11/14/2012   Type 2 diabetes mellitus without complications ([place]) 41/32/4401   Essential hypertension 12/04/2009    PCP: Alysia Penna, MD   REFERRING PROVIDER: Earnie Larsson, MD  REFERRING DIAG:  M43.10 (ICD-10-CM) - Spondylolisthesis, site unspecified   Rationale for Evaluation and Treatment: Rehabilitation  THERAPY DIAG:  Other low back pain  Cramp and spasm  Abnormal posture  ONSET DATE: 4-5 weeks   SUBJECTIVE:                                                                                                                                                                                           SUBJECTIVE  STATEMENT: I went away for a few days for a break. The bed was too  soft and made my leg hurt. This went away once I got home.   PERTINENT HISTORY:  DM, HTN, renal cancer (05/2022)  PAIN:  Are you having pain? Yes: NPRS scale: 2/10  Pain location: Rt hip Pain description: aching  Aggravating factors: lifting, sitting too long Relieving factors: change of position, not lifting   PRECAUTIONS: Other: history of cancer   WEIGHT BEARING RESTRICTIONS: No  FALLS:  Has patient fallen in last 6 months? No  LIVING ENVIRONMENT: Lives with: lives with their spouse Lives in: House/apartment, has chair lift if needed  OCCUPATION: retired   PLOF: Independent and Leisure: golf 2x/wk, walk with wife  PATIENT GOALS: reduce LBP, prevent sciatic nerve pain from returning   NEXT MD VISIT: none   OBJECTIVE:   DIAGNOSTIC FINDINGS:  MRI: 08/16/22 IMPRESSION: 1. Transitional lumbosacral anatomy. 2. Degenerative changes of the lumbar spine, more pronounced at L5-S1 where there is moderate spinal canal stenosis with prominent narrowing of the bilateral subarticular zones and mild right neural foraminal narrowing. 3. Mild spinal canal stenosis with mild narrowing of the bilateral subarticular zones and mild bilateral neural foraminal narrowing at L3-4 and L4-5.  PATIENT SURVEYS:  10/05/22: FOTO 67 (goal is 72)  SCREENING FOR RED FLAGS: Bowel or bladder incontinence: No Spinal tumors: No Cauda equina syndrome: No Compression fracture: No Abdominal aneurysm:  No  COGNITION: Overall cognitive status: Within functional limits for tasks assessed     SENSATION: WFL  MUSCLE LENGTH: Limited by 50-60% bilaterally  POSTURE: rounded shoulders, forward head, decreased lumbar lordosis, and flexed trunk   PALPATION: Reduced segmental mobility, tension in bil lumbar musculature and gluteals   LUMBAR ROM:  Lumbar A/ROM limited by 50% with limited segmental mobility in the lumbar spine  LOWER EXTREMITY ROM:    Hip IR limited by 50-60%, hamstring length limited by 50-60%, all other hip flexibility limited by 50%.    LOWER EXTREMITY MMT:    Hips 4/5, knees 4+/5 (limited knee extension due to shortened hamstrings)   LUMBAR SPECIAL TESTS:  Straight leg raise test: Negative  FUNCTIONAL TESTS:  Sit to stand without UE support  GAIT: Distance walked: 50 Assistive device utilized: None Level of assistance: Modified independence Comments: flexed trunk, wide base of support   TODAY'S TREATMENT:     10/26/22: Review of body mechanics: including avoiding unsupported forward stooping. Pt verbally understood principles.   NuStep Level 3x10 minutes- PTA monitored for pain Hip stretching in supine with strap Bil: frontal & sagittal planes Supine bridge 10x2 Red band clamshells 2x15 with TA contraction: slight PPT Single knee to chest RT/LT/RT 20 sec  Supine piriformis stretch RT 2x 20 sec, Lt 20 sec Manual: addaday to bil RTlow back and gluteals x 10 min  10/19/22:  Seated hamstring stretch: 3x20 seconds  Low trunk rotation 3x20 seconds  Knee to chest 3x20 seconds  TA activation with ball squeeze in hooklying: 5" hold x 10  Supine clam with yellow loop 2x15 and TA activation Supine piriformis stretch 3x20 seconds  Supine bridge 10x NuStep Level 3x10 minutes- PTA monitored for pain Manual: addaday to bil low back and gluteals x 10 min     DATE: 10/15/22  Seated hamstring stretch: 3x20 seconds  Low trunk rotation 3x20 seconds  Knee to chest  3x20 seconds  TA activation with ball squeeze in hooklying: 5" hold x 10  Supine clam with blue loop x20 and  TA activation Seated piriformis stretch 3x20 seconds  NuStep Level 2x 8 minutes- PT monitored for pain Manual: addaday to bil low back and gluteals x 10 min  TODAY'S TREATMENT:                                                                                    DATE: 10/05/22  HEP established- see below  PATIENT EDUCATION:  Education details: Access Code: [email or web address] Person educated: Patient Education method: Explanation, Demonstration, and Handouts Education comprehension: verbalized understanding and returned demonstration  HOME EXERCISE PROGRAM: Access Code: [facility] URL: [email or web address] Date: 10/08/2022 Prepared by: Claiborne Billings  Exercises - Seated Hamstring Stretch  - 3 x daily - 7 x weekly - 1 sets - 3 reps - 20 hold - Hooklying Single Knee to Chest Stretch  - 3 x daily - 7 x weekly - 1 sets - 3 reps - 20 hold - Supine Lower Trunk Rotation  - 3 x daily - 7 x weekly - 1 sets - 3 reps - 2 hold - Seated Figure 4 Piriformis Stretch  - 3 x daily - 7 x weekly - 1 sets - 3 reps - 20 hold - Clamshell  - 1 x daily - 7 x weekly - 1-2 sets - 10 reps - Supine Hip Adduction Isometric with Ball  - 1 x daily - 7 x weekly - 1-2 sets - 10 reps - 5 hold  ASSESSMENT:  CLINICAL IMPRESSION: Pt arrives with very little RT hip pain. He had some leg symptoms while taking a trip and sleeping on a soft mattress. This has since abolished. Pt was educated in avoiding unsupported forward stooping to decrease strain on his back. Pt remains with RT hip tightness > Lt and was encouraged to keep stretching consistently.   OBJECTIVE IMPAIRMENTS: decreased activity tolerance, decreased balance, decreased endurance, difficulty walking, decreased ROM, decreased strength, hypomobility, increased muscle spasms, impaired flexibility, improper body mechanics, postural dysfunction, and pain.    ACTIVITY LIMITATIONS: sitting, standing, transfers, locomotion level, and caring for others  PARTICIPATION LIMITATIONS: meal prep, cleaning, laundry, shopping, and community activity  PERSONAL FACTORS: Age, Social background, and 1-2 comorbidities: lumbar DDD, cares for wife  are also affecting patient's functional outcome.   REHAB POTENTIAL: Good  CLINICAL DECISION MAKING: Evolving/moderate complexity  EVALUATION COMPLEXITY: Moderate   GOALS: Goals reviewed with patient? Yes  SHORT TERM GOALS: Target date: 11/03/2022   Be independent in initial HEP Baseline: Goal status: INITIAL  2.  Report > or = to 30% reduction in LBP with standing and walking  Baseline:  Goal status: INITIAL  3.  Initiate return to regular exercise routine including pilates  Baseline: doing stretches consistently (10/15/22) Goal status: in progress    LONG TERM GOALS: Target date: 11/30/2022    Be independent in advanced HEP Baseline:  Goal status: INITIAL  2.  Improve FOTO to > or = to 72 Baseline: 67 Goal status: INITIAL  3.  Report > or = to 70% reduction in LBP with standing and walking  Baseline:  Goal status: INITIAL  4.  Verbalize and demonstrate body mechanics modifications with lifting and carrying for lumbar protection Baseline:  Goal status: INITIAL  5.  Perform exercise routine regularly and verbalize modifications for safe progression Baseline:  Goal status: INITIAL   PLAN:  PT FREQUENCY: 2x/week  PT DURATION: 8 weeks  PLANNED INTERVENTIONS: Therapeutic exercises, Therapeutic activity, Neuromuscular re-education, Balance training, Gait training, Patient/Family education, Self Care, Joint mobilization, Aquatic Therapy, Dry Needling, Spinal mobilization, Cryotherapy, Moist heat, Taping, Traction, Manual therapy, and Re-evaluation.  PLAN FOR NEXT SESSION:Work on balance, hip flexibility, core strength, spinal segmental mobility.    Myrene Galas, PTA 10/26/22 9:24  AM   [facility] [address] [place], [place] [address] Phone # [phone] Fax [phone]

## 2022-10-28 ENCOUNTER — Other Ambulatory Visit: Payer: Self-pay | Admitting: Family Medicine

## 2022-10-30 ENCOUNTER — Encounter: Payer: Self-pay | Admitting: Physical Therapy

## 2022-10-30 ENCOUNTER — Ambulatory Visit: Payer: Medicare PPO | Admitting: Physical Therapy

## 2022-10-30 DIAGNOSIS — R293 Abnormal posture: Secondary | ICD-10-CM

## 2022-10-30 DIAGNOSIS — R252 Cramp and spasm: Secondary | ICD-10-CM | POA: Diagnosis not present

## 2022-10-30 DIAGNOSIS — M5459 Other low back pain: Secondary | ICD-10-CM

## 2022-10-30 NOTE — Therapy (Signed)
OUTPATIENT PHYSICAL THERAPY TREATMENT   Patient Name: Walter Campbell MRN: [id number] DOB:01-28-1941, 81 y.o., male Today's Date: 10/30/2022  END OF SESSION:  PT End of Session - 10/30/22 1014     Visit Number 6    Date for PT Re-Evaluation 11/30/22    Authorization Type 16 visitis 10/05/22-11/30/22    Authorization - Visit Number 6    Authorization - Number of Visits 16    Progress Note Due on Visit 10    PT Start Time 1012    Activity Tolerance Patient tolerated treatment well    Behavior During Therapy [facility] for tasks assessed/performed               Past Medical History:  Diagnosis Date   Arthritis    Diabetes mellitus    Hypertension    Palpitations 06/10/2010   Pancreatitis    Pneumonia    renal ca 05/20/2022   Past Surgical History:  Procedure Laterality Date   APPENDECTOMY  1957   COLONOSCOPY  12/19/2018   per Dr. 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   TONSILLECTOMY     Patient Active Problem List   Diagnosis Date Noted   Right renal mass 05/20/2022   Type 2 diabetes mellitus without complication, without long-term current use of insulin ([place]) 03/10/2021   Piriformis syndrome of right side 04/26/2019   Anxiety state 11/27/2014   Hyperlipidemia 11/26/2014   GERD (gastroesophageal reflux disease) 11/14/2012   Type 2 diabetes mellitus without complications ([place]) 88/91/6945   Essential hypertension 12/04/2009    PCP: Alysia Penna, MD   REFERRING PROVIDER: Earnie Larsson, MD  REFERRING DIAG: M43.10 (ICD-10-CM) - Spondylolisthesis, site unspecified    Rationale for Evaluation and Treatment: Rehabilitation  THERAPY DIAG:  Other low back pain  Cramp and spasm  Abnormal posture  ONSET DATE: 4-5 weeks   SUBJECTIVE:                                                                                                                                                                                           SUBJECTIVE STATEMENT: I am doing ok, just a heavy heart today  PERTINENT HISTORY:  DM,  HTN, renal cancer (05/2022)  PAIN:  Are you having pain? Yes: NPRS scale: 2/10  Pain location: Multiple joint aches today Pain description: aching  Aggravating factors: lifting, sitting too long Relieving factors: change of position, not lifting   PRECAUTIONS: Other: history of cancer   WEIGHT BEARING RESTRICTIONS: No  FALLS:  Has patient fallen in last 6 months? No  LIVING ENVIRONMENT: Lives with: lives with their spouse Lives in: House/apartment, has chair lift if needed  OCCUPATION: retired   PLOF: Independent and Leisure: golf 2x/wk, walk with wife  PATIENT GOALS: reduce LBP, prevent sciatic nerve pain from returning   NEXT MD VISIT: none   OBJECTIVE:   DIAGNOSTIC FINDINGS:  MRI: 08/16/22 IMPRESSION: 1. Transitional lumbosacral anatomy. 2. Degenerative changes of the lumbar spine, more pronounced at L5-S1 where there is moderate spinal canal stenosis with prominent narrowing of the bilateral subarticular zones and mild right neural foraminal narrowing. 3. Mild spinal canal stenosis with mild narrowing of the bilateral subarticular zones and mild bilateral neural foraminal narrowing at L3-4 and L4-5.  PATIENT SURVEYS:  10/05/22: FOTO 67 (goal is 72)  SCREENING FOR RED FLAGS: Bowel or bladder incontinence: No Spinal tumors: No Cauda equina syndrome: No Compression fracture: No Abdominal aneurysm: No  COGNITION: Overall cognitive status: Within functional limits for tasks assessed     SENSATION: WFL  MUSCLE  LENGTH: Limited by 50-60% bilaterally  POSTURE: rounded shoulders, forward head, decreased lumbar lordosis, and flexed trunk   PALPATION: Reduced segmental mobility, tension in bil lumbar musculature and gluteals   LUMBAR ROM:  Lumbar A/ROM limited by 50% with limited segmental mobility in the lumbar spine  LOWER EXTREMITY ROM:    Hip IR limited by 50-60%, hamstring length limited by 50-60%, all other hip flexibility limited by 50%.    LOWER EXTREMITY MMT:    Hips 4/5, knees 4+/5 (limited knee extension due to shortened hamstrings)   LUMBAR SPECIAL TESTS:  Straight leg raise test: Negative  FUNCTIONAL TESTS:  Sit to stand without UE support  GAIT: Distance walked: 50 Assistive device utilized: None Level of assistance: Modified independence Comments: flexed trunk, wide base of support   TODAY'S TREATMENT:     10/30/22:  NuStep Level 4 x10 minutes- PTA monitored for pain Hip stretching in supine with strap Bil: frontal & sagittal planes Supine bridge 10x2 5 sec hold Red band clamshells 2x15 with TA contraction: slight PPT Single knee to chest RT/LT/RT 20 sec  Supine piriformis stretch RT 2x 20 sec, Lt 20 sec Manual: addaday to bil RTlow back and gluteals x 10 min  10/26/22: Review of body mechanics: including avoiding unsupported forward stooping. Pt verbally understood principles.   NuStep Level 3x10 minutes- PTA monitored for pain Hip stretching in supine with strap Bil: frontal & sagittal planes Supine bridge 10x2 Red band clamshells 2x15 with TA contraction: slight PPT Single knee to chest RT/LT/RT 20 sec  Supine piriformis stretch RT 2x 20 sec, Lt 20 sec Manual: addaday to bil RTlow back and gluteals x 10 min  10/19/22:  Seated hamstring stretch: 3x20 seconds  Low trunk rotation 3x20 seconds  Knee to chest 3x20 seconds  TA activation with ball squeeze in hooklying: 5" hold x 10  Supine clam with yellow loop 2x15 and TA activation Supine piriformis stretch  3x20 seconds  Supine bridge 10x NuStep Level 3x10 minutes- PTA monitored for pain Manual: addaday to bil low back and gluteals x 10 min      PATIENT EDUCATION:  Education details:  Access Code: [facility] Person educated: Patient Education method: Explanation, Demonstration, and Handouts Education comprehension: verbalized understanding and returned demonstration  HOME EXERCISE PROGRAM: Access Code: [facility] URL: [email or web address] Date: 10/08/2022 Prepared by: Claiborne Billings  Exercises - Seated Hamstring Stretch  - 3 x daily - 7 x weekly - 1 sets - 3 reps - 20 hold - Hooklying Single Knee to Chest Stretch  - 3 x daily - 7 x weekly - 1 sets - 3 reps - 20 hold - Supine Lower Trunk Rotation  - 3 x daily - 7 x weekly - 1 sets - 3 reps - 2 hold - Seated Figure 4 Piriformis Stretch  - 3 x daily - 7 x weekly - 1 sets - 3 reps - 20 hold - Clamshell  - 1 x daily - 7 x weekly - 1-2 sets - 10 reps - Supine Hip Adduction Isometric with Ball  - 1 x daily - 7 x weekly - 1-2 sets - 10 reps - 5 hold  ASSESSMENT:  CLINICAL IMPRESSION: Pt reports his back pain is 40% reduced since start of care and maybe more in regards to his leg pain. All STGs are met. Pt is stretching more consistently now. Pt has no pain with basic mat exercises but has yet been able to get back on his home reformer.   OBJECTIVE IMPAIRMENTS: decreased activity tolerance, decreased balance, decreased endurance, difficulty walking, decreased ROM, decreased strength, hypomobility, increased muscle spasms, impaired flexibility, improper body mechanics, postural dysfunction, and pain.   ACTIVITY LIMITATIONS: sitting, standing, transfers, locomotion level, and caring for others  PARTICIPATION LIMITATIONS: meal prep, cleaning, laundry, shopping, and community activity  PERSONAL FACTORS: Age, Social background, and 1-2 comorbidities: lumbar DDD, cares for wife  are also affecting patient's functional outcome.   REHAB  POTENTIAL: Good  CLINICAL DECISION MAKING: Evolving/moderate complexity  EVALUATION COMPLEXITY: Moderate   GOALS: Goals reviewed with patient? Yes  SHORT TERM GOALS: Target date: 11/03/2022   Be independent in initial HEP Baseline: Goal status: Goal me t 10/30/22  2.  Report > or = to 30% reduction in LBP with standing and walking  Baseline:  Goal status: Goal met 40% 10/30/22  3.  Initiate return to regular exercise routine including pilates  Baseline: doing stretches consistently (10/15/22) Goal status: in progress    LONG TERM GOALS: Target date: 11/30/2022    Be independent in advanced HEP Baseline:  Goal status: Goal met 10/30/22  2.  Improve FOTO to > or = to 72 Baseline: 67 Goal status: INITIAL  3.  Report > or = to 70% reduction in LBP with standing and walking  Baseline:  Goal status: On going 40% improved  4.  Verbalize and demonstrate body mechanics modifications with lifting and carrying for lumbar protection Baseline:  Goal status: Goal met: 10/30/22  5.  Perform exercise routine regularly and verbalize modifications for safe progression Baseline:  Goal status: INITIAL   PLAN:  PT FREQUENCY: 2x/week  PT DURATION: 8 weeks  PLANNED INTERVENTIONS: Therapeutic exercises, Therapeutic activity, Neuromuscular re-education, Balance training, Gait training, Patient/Family education, Self Care, Joint mobilization, Aquatic Therapy, Dry Needling, Spinal mobilization, Cryotherapy, Moist heat, Taping, Traction, Manual therapy, and Re-evaluation.  PLAN FOR NEXT SESSION:Work on balance, hip flexibility, core strength, spinal segmental mobility.    Myrene Galas, PTA 10/30/22 10:14 AM   [facility] [address], [place], [place] [address] Phone # [phone] Fax [phone]

## 2022-11-04 ENCOUNTER — Ambulatory Visit: Payer: Medicare PPO

## 2022-11-04 DIAGNOSIS — R252 Cramp and spasm: Secondary | ICD-10-CM | POA: Diagnosis not present

## 2022-11-04 DIAGNOSIS — M5459 Other low back pain: Secondary | ICD-10-CM

## 2022-11-04 DIAGNOSIS — R293 Abnormal posture: Secondary | ICD-10-CM

## 2022-11-04 NOTE — Therapy (Signed)
OUTPATIENT PHYSICAL THERAPY TREATMENT   Patient Name: Walter Campbell MRN: [id number] DOB:03-28-1941, 81 y.o., male Today's Date: 11/04/2022  END OF SESSION:  PT End of Session - 11/04/22 1015     Visit Number 7    Date for PT Re-Evaluation 11/30/22    Authorization Type 16 visitis 10/05/22-11/30/22    Authorization - Visit Number 7    Authorization - Number of Visits 16    Progress Note Due on Visit 10    PT Start Time 0933    PT Stop Time 1696    PT Time Calculation (min) 41 min    Activity Tolerance Patient tolerated treatment well    Behavior During Therapy [facility] for tasks assessed/performed                Past Medical History:  Diagnosis Date   Arthritis    Diabetes mellitus    Hypertension    Palpitations 06/10/2010   Pancreatitis    Pneumonia    renal ca 05/20/2022   Past Surgical History:  Procedure Laterality Date   APPENDECTOMY  1957   COLONOSCOPY  12/19/2018   per Dr. 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   TONSILLECTOMY     Patient Active Problem List   Diagnosis Date Noted   Right renal mass 05/20/2022   Type 2 diabetes mellitus without complication, without long-term current use of insulin ([place]) 03/10/2021   Piriformis syndrome of right side 04/26/2019   Anxiety state 11/27/2014   Hyperlipidemia 11/26/2014   GERD (gastroesophageal reflux disease) 11/14/2012   Type 2 diabetes mellitus without complications ([place]) 78/93/8101   Essential hypertension 12/04/2009    PCP: Alysia Penna, MD   REFERRING PROVIDER: Earnie Larsson, MD  REFERRING DIAG:  M43.10 (ICD-10-CM) - Spondylolisthesis, site unspecified   Rationale for Evaluation and Treatment: Rehabilitation  THERAPY DIAG:  Other low back pain  Cramp and spasm  Abnormal posture  ONSET DATE: 4-5 weeks   SUBJECTIVE:  SUBJECTIVE STATEMENT: My wife is not doing well.    PERTINENT HISTORY:  DM, HTN, renal cancer (05/2022)  PAIN:  Are you having pain? Yes: NPRS scale: 2/10  Pain location: Multiple joint aches today Pain description: aching  Aggravating factors: lifting, sitting too long Relieving factors: change of position, not lifting   PRECAUTIONS: Other: history of cancer   WEIGHT BEARING RESTRICTIONS: No  FALLS:  Has patient fallen in last 6 months? No  LIVING ENVIRONMENT: Lives with: lives with their spouse Lives in: House/apartment, has chair lift if needed  OCCUPATION: retired   PLOF: Independent and Leisure: golf 2x/wk, walk with wife  PATIENT GOALS: reduce LBP, prevent sciatic nerve pain from returning   NEXT MD VISIT: none   OBJECTIVE:   DIAGNOSTIC FINDINGS:  MRI: 08/16/22 IMPRESSION: 1. Transitional lumbosacral anatomy. 2. Degenerative changes of the lumbar spine, more pronounced at L5-S1 where there is moderate spinal canal stenosis with prominent narrowing of the bilateral subarticular zones and mild right neural foraminal narrowing. 3. Mild spinal canal stenosis with mild narrowing of the bilateral subarticular zones and mild bilateral neural foraminal narrowing at L3-4 and L4-5.  PATIENT SURVEYS:  10/05/22: FOTO 67 (goal is 72)  SCREENING FOR RED FLAGS: Bowel or bladder incontinence: No Spinal tumors: No Cauda equina syndrome: No Compression fracture: No Abdominal aneurysm: No  COGNITION: Overall cognitive status: Within functional limits for tasks  assessed     SENSATION: WFL  MUSCLE LENGTH: Limited by 50-60% bilaterally  POSTURE: rounded shoulders, forward head, decreased lumbar lordosis, and flexed trunk   PALPATION: Reduced segmental mobility, tension in bil lumbar musculature and gluteals   LUMBAR ROM:  Lumbar A/ROM limited by 50% with limited segmental mobility in the lumbar spine  LOWER EXTREMITY ROM:    Hip IR limited by 50-60%, hamstring length limited by 50-60%, all other hip flexibility limited by 50%.    LOWER EXTREMITY MMT:    Hips 4/5, knees 4+/5 (limited knee extension due to shortened hamstrings)   LUMBAR SPECIAL TESTS:  Straight leg raise test: Negative  FUNCTIONAL TESTS:  Sit to stand without UE support  GAIT: Distance walked: 50 Assistive device utilized: None Level of assistance: Modified independence Comments: flexed trunk, wide base of support   TODAY'S TREATMENT:    11/04/22:  NuStep Level 4 x10 minutes- PT monitored for pain Hip stretching in supine with strap Bil: frontal & sagittal planes Supine bridge 10x2 5 sec hold with ball squeeze  Red band clamshells 2x15 with TA contraction: slight PPT Single knee to chest RT/LT/RT 20 sec  Sit to stand: holding 5# kettle bell 2x10 Manual: addaday to bil RTlow back and gluteals x 10 min  10/30/22:  NuStep Level 4 x10 minutes- PTA monitored for pain Hip stretching in supine with strap Bil: frontal & sagittal planes Supine bridge 10x2 5 sec hold Red band clamshells 2x15 with TA contraction: slight PPT Single knee to chest RT/LT/RT 20 sec  Supine piriformis stretch RT 2x 20 sec, Lt 20 sec Manual: addaday to bil RTlow back and gluteals x 10 min  10/26/22: Review of body mechanics: including avoiding unsupported forward stooping. Pt verbally understood principles.   NuStep Level 3x10 minutes- PTA monitored for pain Hip stretching in supine with strap Bil: frontal & sagittal planes Supine bridge 10x2 Red band clamshells 2x15 with TA  contraction: slight PPT Single knee to chest RT/LT/RT 20 sec  Supine piriformis stretch RT 2x 20 sec, Lt 20 sec Manual: addaday to bil RTlow back and gluteals  x 10 min  PATIENT EDUCATION:  Education details: Access Code: [email or web address] Person educated: Patient Education method: Explanation, Demonstration, and Handouts Education comprehension: verbalized understanding and returned demonstration  HOME EXERCISE PROGRAM: Access Code: [facility] URL: [email or web address] Date: 10/08/2022 Prepared by: Claiborne Billings  Exercises - Seated Hamstring Stretch  - 3 x daily - 7 x weekly - 1 sets - 3 reps - 20 hold - Hooklying Single Knee to Chest Stretch  - 3 x daily - 7 x weekly - 1 sets - 3 reps - 20 hold - Supine Lower Trunk Rotation  - 3 x daily - 7 x weekly - 1 sets - 3 reps - 2 hold - Seated Figure 4 Piriformis Stretch  - 3 x daily - 7 x weekly - 1 sets - 3 reps - 20 hold - Clamshell  - 1 x daily - 7 x weekly - 1-2 sets - 10 reps - Supine Hip Adduction Isometric with Ball  - 1 x daily - 7 x weekly - 1-2 sets - 10 reps - 5 hold  ASSESSMENT:  CLINICAL IMPRESSION: Pt reports his back pain is 40% reduced since start of care and is no longer experiencing sharp pains. All STGs were met last week.  Pt is stretching more consistently now. Pt was busy over the holiday and was not able to be as consistent with HEP.  Pt was able to complete exercises in the clinic with intermittent cueing and no increased pain. Patient will benefit from skilled PT to address the below impairments and improve overall function.    OBJECTIVE IMPAIRMENTS: decreased activity tolerance, decreased balance, decreased endurance, difficulty walking, decreased ROM, decreased strength, hypomobility, increased muscle spasms, impaired flexibility, improper body mechanics, postural dysfunction, and pain.   ACTIVITY LIMITATIONS: sitting, standing, transfers, locomotion level, and caring for others  PARTICIPATION LIMITATIONS: meal prep,  cleaning, laundry, shopping, and community activity  PERSONAL FACTORS: Age, Social background, and 1-2 comorbidities: lumbar DDD, cares for wife  are also affecting patient's functional outcome.   REHAB POTENTIAL: Good  CLINICAL DECISION MAKING: Evolving/moderate complexity  EVALUATION COMPLEXITY: Moderate   GOALS: Goals reviewed with patient? Yes  SHORT TERM GOALS: Target date: 11/03/2022   Be independent in initial HEP Baseline: Goal status: Goal me t 10/30/22  2.  Report > or = to 30% reduction in LBP with standing and walking  Baseline:  Goal status: Goal met 40% 10/30/22  3.  Initiate return to regular exercise routine including pilates  Baseline: doing stretches consistently (10/15/22) Goal status: in progress    LONG TERM GOALS: Target date: 11/30/2022    Be independent in advanced HEP Baseline:  Goal status: Goal met 10/30/22  2.  Improve FOTO to > or = to 72 Baseline: 67 Goal status: INITIAL  3.  Report > or = to 70% reduction in LBP with standing and walking  Baseline:  Goal status: On going 40% improved  4.  Verbalize and demonstrate body mechanics modifications with lifting and carrying for lumbar protection Baseline:  Goal status: Goal met: 10/30/22  5.  Perform exercise routine regularly and verbalize modifications for safe progression Baseline: doing exercises consistently Goal status: in progress    PLAN:  PT FREQUENCY: 2x/week  PT DURATION: 8 weeks  PLANNED INTERVENTIONS: Therapeutic exercises, Therapeutic activity, Neuromuscular re-education, Balance training, Gait training, Patient/Family education, Self Care, Joint mobilization, Aquatic Therapy, Dry Needling, Spinal mobilization, Cryotherapy, Moist heat, Taping, Traction, Manual therapy, and Re-evaluation.  PLAN FOR NEXT SESSION:Work on balance, hip flexibility, core strength, spinal  segmental mobility.    Sigurd Sos, PT 11/04/22 10:18 AM   [facility]  [facility] [address] [place], [place] [address] Phone # [phone] Fax [phone]

## 2022-11-11 ENCOUNTER — Ambulatory Visit: Payer: Medicare PPO | Attending: Neurosurgery

## 2022-11-11 DIAGNOSIS — M5459 Other low back pain: Secondary | ICD-10-CM

## 2022-11-11 DIAGNOSIS — R252 Cramp and spasm: Secondary | ICD-10-CM | POA: Diagnosis not present

## 2022-11-11 DIAGNOSIS — R293 Abnormal posture: Secondary | ICD-10-CM | POA: Insufficient documentation

## 2022-11-11 NOTE — Therapy (Signed)
OUTPATIENT PHYSICAL THERAPY TREATMENT   Patient Name: Walter Campbell MRN: [id number] DOB:04/29/1941, 82 y.o., male Today's Date: 11/11/2022  END OF SESSION:  PT End of Session - 11/11/22 0935     Visit Number 8    Date for PT Re-Evaluation 11/30/22    Authorization Type 16 visitis 10/05/22-11/30/22    Authorization - Visit Number 8    Authorization - Number of Visits 16    Progress Note Due on Visit 10    PT Start Time 0846    PT Stop Time 0931    PT Time Calculation (min) 45 min    Activity Tolerance Patient tolerated treatment well    Behavior During Therapy [facility] for tasks assessed/performed                 Past Medical History:  Diagnosis Date   Arthritis    Diabetes mellitus    Hypertension    Palpitations 06/10/2010   Pancreatitis    Pneumonia    renal ca 05/20/2022   Past Surgical History:  Procedure Laterality Date   APPENDECTOMY  1957   COLONOSCOPY  12/19/2018   per Dr. 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   TONSILLECTOMY     Patient Active Problem List   Diagnosis Date Noted   Right renal mass 05/20/2022   Type 2 diabetes mellitus without complication, without long-term current use of insulin ([place]) 03/10/2021   Piriformis syndrome of right side 04/26/2019   Anxiety state 11/27/2014   Hyperlipidemia 11/26/2014   GERD (gastroesophageal reflux disease) 11/14/2012   Type 2 diabetes mellitus without complications ([place]) 80/99/8338   Essential hypertension 12/04/2009    PCP: Alysia Penna, MD   REFERRING PROVIDER: Earnie Larsson, MD  REFERRING DIAG:  M43.10 (ICD-10-CM) - Spondylolisthesis, site unspecified   Rationale for Evaluation and Treatment: Rehabilitation  THERAPY DIAG:  Other low back pain  Cramp and spasm  ONSET DATE: 4-5 weeks   SUBJECTIVE:                                                                                                                                                                                           SUBJECTIVE  STATEMENT: I haven't been doing my exercises because my wife is very ill.    PERTINENT HISTORY:  DM, HTN, renal cancer (05/2022)  PAIN:  Are you having pain? Yes: NPRS scale: 1-2/10  (aching) Pain location: Multiple joint aches today Pain description: aching  Aggravating factors: lifting, sitting too long Relieving factors: change of position, not lifting   PRECAUTIONS: Other: history of cancer   WEIGHT BEARING RESTRICTIONS: No  FALLS:  Has patient fallen in last 6 months? No  LIVING ENVIRONMENT: Lives with: lives with their spouse Lives in: House/apartment, has chair lift if needed  OCCUPATION: retired   PLOF: Independent and Leisure: golf 2x/wk, walk with wife  PATIENT GOALS: reduce LBP, prevent sciatic nerve pain from returning   NEXT MD VISIT: none   OBJECTIVE:   DIAGNOSTIC FINDINGS:  MRI: 08/16/22 IMPRESSION: 1. Transitional lumbosacral anatomy. 2. Degenerative changes of the lumbar spine, more pronounced at L5-S1 where there is moderate spinal canal stenosis with prominent narrowing of the bilateral subarticular zones and mild right neural foraminal narrowing. 3. Mild spinal canal stenosis with mild narrowing of the bilateral subarticular zones and mild bilateral neural foraminal narrowing at L3-4 and L4-5.  PATIENT SURVEYS:  10/05/22: FOTO 67 (goal is 72)  SCREENING FOR RED FLAGS: Bowel or bladder incontinence: No Spinal tumors: No Cauda equina syndrome: No Compression fracture: No Abdominal aneurysm: No  COGNITION: Overall cognitive status: Within  functional limits for tasks assessed     SENSATION: WFL  MUSCLE LENGTH: Limited by 50-60% bilaterally  POSTURE: rounded shoulders, forward head, decreased lumbar lordosis, and flexed trunk   PALPATION: Reduced segmental mobility, tension in bil lumbar musculature and gluteals   LUMBAR ROM:  Lumbar A/ROM limited by 50% with limited segmental mobility in the lumbar spine  LOWER EXTREMITY ROM:    Hip IR limited by 50-60%, hamstring length limited by 50-60%, all other hip flexibility limited by 50%.    LOWER EXTREMITY MMT:    Hips 4/5, knees 4+/5 (limited knee extension due to shortened hamstrings)   LUMBAR SPECIAL TESTS:  Straight leg raise test: Negative  FUNCTIONAL TESTS:  Sit to stand without UE support  GAIT: Distance walked: 50 Assistive device utilized: None Level of assistance: Modified independence Comments: flexed trunk, wide base of support   TODAY'S TREATMENT:    11/11/22:  NuStep Level 4 x 9 minutes- PT monitored for pain Hip stretching in supine with strap Bil: frontal & sagittal planes Standing hip extension: 2x10 bil each Supine bridge 10x2 5 sec hold with ball squeeze  Red band clamshells 2x15 with TA contraction: slight PPT Single knee to chest RT/LT/RT 20 sec  Sit to stand: holding 5# kettle bell 2x10 Manual: addaday to bil RTlow back and gluteals x 10 min  11/04/22:  NuStep Level 4 x10 minutes- PT monitored for pain Hip stretching in supine with strap Bil: frontal & sagittal planes Supine bridge 10x2 5 sec hold with ball squeeze  Red band clamshells 2x15 with TA contraction: slight PPT Single knee to chest RT/LT/RT 20 sec  Sit to stand: holding 5# kettle bell 2x10 Manual: addaday to bil RTlow back and gluteals x 10 min  10/30/22:  NuStep Level 4 x10 minutes- PTA monitored for pain Hip stretching in supine with strap Bil: frontal & sagittal planes Supine bridge 10x2 5 sec hold Red band clamshells 2x15 with TA contraction: slight PPT Single knee  to chest RT/LT/RT 20 sec  Supine piriformis stretch RT 2x 20 sec, Lt 20 sec Manual: addaday to  bil RTlow back and gluteals x 10 min   PATIENT EDUCATION:  Education details: Access Code: [email or web address] Person educated: Patient Education method: Explanation, Demonstration, and Handouts Education comprehension: verbalized understanding and returned demonstration  HOME EXERCISE PROGRAM: Access Code: [facility] URL: [email or web address] Date: 10/08/2022 Prepared by: Claiborne Billings  Exercises - Seated Hamstring Stretch  - 3 x daily - 7 x weekly - 1 sets - 3 reps - 20 hold - Hooklying Single Knee to Chest Stretch  - 3 x daily - 7 x weekly - 1 sets - 3 reps - 20 hold - Supine Lower Trunk Rotation  - 3 x daily - 7 x weekly - 1 sets - 3 reps - 2 hold - Seated Figure 4 Piriformis Stretch  - 3 x daily - 7 x weekly - 1 sets - 3 reps - 20 hold - Clamshell  - 1 x daily - 7 x weekly - 1-2 sets - 10 reps - Supine Hip Adduction Isometric with Ball  - 1 x daily - 7 x weekly - 1-2 sets - 10 reps - 5 hold  ASSESSMENT:  CLINICAL IMPRESSION: Pt reports his back pain is 40% reduced since start of care and is no longer experiencing sharp pains. Pt is dealing with his sick wife at home and has not been able to be consistent with HEP due to this. Pt was able to complete exercises in the clinic with intermittent cueing and no increased pain.  PT provided verbal and tactile cues for technique.  Patient will benefit from skilled PT to address the below impairments and improve overall function.    OBJECTIVE IMPAIRMENTS: decreased activity tolerance, decreased balance, decreased endurance, difficulty walking, decreased ROM, decreased strength, hypomobility, increased muscle spasms, impaired flexibility, improper body mechanics, postural dysfunction, and pain.   ACTIVITY LIMITATIONS: sitting, standing, transfers, locomotion level, and caring for others  PARTICIPATION LIMITATIONS: meal prep, cleaning, laundry,  shopping, and community activity  PERSONAL FACTORS: Age, Social background, and 1-2 comorbidities: lumbar DDD, cares for wife  are also affecting patient's functional outcome.   REHAB POTENTIAL: Good  CLINICAL DECISION MAKING: Evolving/moderate complexity  EVALUATION COMPLEXITY: Moderate   GOALS: Goals reviewed with patient? Yes  SHORT TERM GOALS: Target date: 11/03/2022   Be independent in initial HEP Baseline: Goal status: Goal me t 10/30/22  2.  Report > or = to 30% reduction in LBP with standing and walking  Baseline:  Goal status: Goal met 40% 10/30/22  3.  Initiate return to regular exercise routine including pilates  Baseline: doing stretches consistently (10/15/22) Goal status: in progress    LONG TERM GOALS: Target date: 11/30/2022    Be independent in advanced HEP Baseline:  Goal status: Goal met 10/30/22  2.  Improve FOTO to > or = to 72 Baseline: 67 Goal status: INITIAL  3.  Report > or = to 70% reduction in LBP with standing and walking  Baseline:  Goal status: On going 40% improved  4.  Verbalize and demonstrate body mechanics modifications with lifting and carrying for lumbar protection Baseline:  Goal status: Goal met: 10/30/22  5.  Perform exercise routine regularly and verbalize modifications for safe progression Baseline: doing exercises consistently Goal status: in progress    PLAN:  PT FREQUENCY: 2x/week  PT DURATION: 8 weeks  PLANNED INTERVENTIONS: Therapeutic exercises, Therapeutic activity, Neuromuscular re-education, Balance training, Gait training, Patient/Family education, Self Care, Joint mobilization, Aquatic Therapy, Dry Needling, Spinal mobilization, Cryotherapy, Moist heat, Taping, Traction, Manual therapy, and Re-evaluation.  PLAN FOR NEXT  SESSION:Work on balance, hip flexibility, core strength, spinal segmental mobility.    Sigurd Sos, PT 11/11/22 9:36 AM   [place] [address],  [place], [place] [address] Phone # [phone] Fax [phone]

## 2022-11-12 ENCOUNTER — Ambulatory Visit: Payer: Medicare PPO

## 2022-11-12 DIAGNOSIS — M5459 Other low back pain: Secondary | ICD-10-CM | POA: Diagnosis not present

## 2022-11-12 DIAGNOSIS — R293 Abnormal posture: Secondary | ICD-10-CM

## 2022-11-12 DIAGNOSIS — R252 Cramp and spasm: Secondary | ICD-10-CM

## 2022-11-12 NOTE — Therapy (Signed)
OUTPATIENT PHYSICAL THERAPY TREATMENT   Patient Name: Walter Campbell MRN: [id number] DOB:03-Feb-1941, 82 y.o., male Today's Date: 11/12/2022  END OF SESSION:  PT End of Session - 11/12/22 1015     Visit Number 9    Date for PT Re-Evaluation 11/30/22    Authorization Type 16 visitis 10/05/22-11/30/22    Authorization - Visit Number 9    Authorization - Number of Visits 16    Progress Note Due on Visit 10    PT Start Time 9179    PT Stop Time 1505    PT Time Calculation (min) 43 min    Activity Tolerance Patient tolerated treatment well    Behavior During Therapy [facility] for tasks assessed/performed                  Past Medical History:  Diagnosis Date   Arthritis    Diabetes mellitus    Hypertension    Palpitations 06/10/2010   Pancreatitis    Pneumonia    renal ca 05/20/2022   Past Surgical History:  Procedure Laterality Date   APPENDECTOMY  1957   COLONOSCOPY  12/19/2018   per Dr. 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   TONSILLECTOMY     Patient Active Problem List   Diagnosis Date Noted   Right renal mass 05/20/2022   Type 2 diabetes mellitus without complication, without long-term current use of insulin ([place]) 03/10/2021   Piriformis syndrome of right side 04/26/2019   Anxiety state 11/27/2014   Hyperlipidemia 11/26/2014   GERD (gastroesophageal reflux disease) 11/14/2012   Type 2 diabetes mellitus without complications ([place]) 69/79/4801   Essential hypertension 12/04/2009    PCP: Alysia Penna, MD   REFERRING PROVIDER: Earnie Larsson, MD  REFERRING  DIAG: M43.10 (ICD-10-CM) - Spondylolisthesis, site unspecified   Rationale for Evaluation and Treatment: Rehabilitation  THERAPY DIAG:  Other low back pain  Cramp and spasm  Abnormal posture  ONSET DATE: 4-5 weeks   SUBJECTIVE:  SUBJECTIVE STATEMENT: Rt hamstrings feel more loose today.   PERTINENT HISTORY:  DM, HTN, renal cancer (05/2022)  PAIN:  Are you having pain? Yes: NPRS scale: 1-2/10  (aching) Pain location: Multiple joint aches today Pain description: aching  Aggravating factors: lifting, sitting too long Relieving factors: change of position, not lifting   PRECAUTIONS: Other: history of cancer   WEIGHT BEARING RESTRICTIONS: No  FALLS:  Has patient fallen in last 6 months? No  LIVING ENVIRONMENT: Lives with: lives with their spouse Lives in: House/apartment, has chair lift if needed  OCCUPATION: retired   PLOF: Independent and Leisure: golf 2x/wk, walk with wife  PATIENT GOALS: reduce LBP, prevent sciatic nerve pain from returning   NEXT MD VISIT: none   OBJECTIVE:   DIAGNOSTIC FINDINGS:  MRI: 08/16/22 IMPRESSION: 1. Transitional lumbosacral anatomy. 2. Degenerative changes of the lumbar spine, more pronounced at L5-S1 where there is moderate spinal canal stenosis with prominent narrowing of the bilateral subarticular zones and mild right neural foraminal narrowing. 3. Mild spinal canal stenosis with mild narrowing of the bilateral subarticular zones and mild bilateral neural foraminal narrowing at L3-4 and L4-5.  PATIENT SURVEYS:  10/05/22: FOTO 67 (goal is 72)    SCREENING FOR RED FLAGS: Bowel or bladder incontinence: No Spinal tumors: No Cauda equina syndrome: No Compression fracture: No Abdominal aneurysm: No  COGNITION: Overall cognitive status:  Within functional limits for tasks assessed     SENSATION: WFL  MUSCLE LENGTH: Limited by 50-60% bilaterally  POSTURE: rounded shoulders, forward head, decreased lumbar lordosis, and flexed trunk   PALPATION: Reduced segmental mobility, tension in bil lumbar musculature and gluteals   LUMBAR ROM:  Lumbar A/ROM limited by 50% with limited segmental mobility in the lumbar spine  LOWER EXTREMITY ROM:    Hip IR limited by 50-60%, hamstring length limited by 50-60%, all other hip flexibility limited by 50%.    LOWER EXTREMITY MMT:    Hips 4/5, knees 4+/5 (limited knee extension due to shortened hamstrings)   LUMBAR SPECIAL TESTS:  Straight leg raise test: Negative  FUNCTIONAL TESTS:  Sit to stand without UE support  GAIT: Distance walked: 50 Assistive device utilized: None Level of assistance: Modified independence Comments: flexed trunk, wide base of support   TODAY'S TREATMENT:    11/12/22:  NuStep Level 4 x 9 minutes- PT monitored for pain Hip stretching in supine with strap Bil: frontal & sagittal planes Standing hip extension: 2x10 bil each Supine bridge 10x2 5 sec hold with ball squeeze  Red band clamshells 2x15 with TA contraction: slight PPT Single knee to chest RT/LT/RT 20 sec  Sit to stand: holding 5# kettle bell 2x10 Manual: addaday to bil RTlow back and gluteals x 10 min 11/11/22:  NuStep Level 4 x 9 minutes- PT monitored for pain Hip stretching in supine with strap Bil: frontal & sagittal planes Standing hip extension: 2x10 bil each Supine bridge 10x2 5 sec hold with ball squeeze  Red band clamshells 2x15 with TA contraction: slight PPT Single knee to chest RT/LT/RT 20 sec  Sit to stand: holding 5# kettle bell 2x10 Manual: addaday to bil RTlow back and gluteals x 10 min  11/04/22:  NuStep Level 4 x10 minutes- PT monitored for pain Hip stretching in supine with strap Bil: frontal & sagittal planes Supine bridge 10x2 5 sec hold with ball squeeze  Red band  clamshells 2x15 with TA contraction: slight PPT Single knee to chest RT/LT/RT 20 sec  Sit to stand: holding 5# kettle bell  2x10 Manual: addaday to bil RTlow back and gluteals x 10 min  PATIENT EDUCATION:  Education details: Access Code: [email or web address] Person educated: Patient Education method: Explanation, Demonstration, and Handouts Education comprehension: verbalized understanding and returned demonstration  HOME EXERCISE PROGRAM: Access Code: [facility] URL: [email or web address] Date: 10/08/2022 Prepared by: Claiborne Billings  Exercises - Seated Hamstring Stretch  - 3 x daily - 7 x weekly - 1 sets - 3 reps - 20 hold - Hooklying Single Knee to Chest Stretch  - 3 x daily - 7 x weekly - 1 sets - 3 reps - 20 hold - Supine Lower Trunk Rotation  - 3 x daily - 7 x weekly - 1 sets - 3 reps - 2 hold - Seated Figure 4 Piriformis Stretch  - 3 x daily - 7 x weekly - 1 sets - 3 reps - 20 hold - Clamshell  - 1 x daily - 7 x weekly - 1-2 sets - 10 reps - Supine Hip Adduction Isometric with Ball  - 1 x daily - 7 x weekly - 1-2 sets - 10 reps - 5 hold  ASSESSMENT:  CLINICAL IMPRESSION: Pt was here yesterday so limited change since then.  Pt is dealing with his sick wife at home and has not been able to be consistent with HEP due to this. Pt was able to complete exercises in the clinic with intermittent cueing and no increased pain.  PT provided verbal and tactile cues for technique.  Patient will benefit from skilled PT to address the below impairments and improve overall function.    OBJECTIVE IMPAIRMENTS: decreased activity tolerance, decreased balance, decreased endurance, difficulty walking, decreased ROM, decreased strength, hypomobility, increased muscle spasms, impaired flexibility, improper body mechanics, postural dysfunction, and pain.   ACTIVITY LIMITATIONS: sitting, standing, transfers, locomotion level, and caring for others  PARTICIPATION LIMITATIONS: meal prep, cleaning, laundry,  shopping, and community activity  PERSONAL FACTORS: Age, Social background, and 1-2 comorbidities: lumbar DDD, cares for wife  are also affecting patient's functional outcome.   REHAB POTENTIAL: Good  CLINICAL DECISION MAKING: Evolving/moderate complexity  EVALUATION COMPLEXITY: Moderate   GOALS: Goals reviewed with patient? Yes  SHORT TERM GOALS: Target date: 11/03/2022   Be independent in initial HEP Baseline: Goal status: Goal me t 10/30/22  2.  Report > or = to 30% reduction in LBP with standing and walking  Baseline:  Goal status: Goal met 40% 10/30/22  3.  Initiate return to regular exercise routine including pilates  Baseline: doing stretches consistently (10/15/22) Goal status: in progress    LONG TERM GOALS: Target date: 11/30/2022    Be independent in advanced HEP Baseline:  Goal status: Goal met 10/30/22  2.  Improve FOTO to > or = to 72 Baseline: 67 Goal status: INITIAL  3.  Report > or = to 70% reduction in LBP with standing and walking  Baseline:  Goal status: On going 40% improved  4.  Verbalize and demonstrate body mechanics modifications with lifting and carrying for lumbar protection Baseline:  Goal status: Goal met: 10/30/22  5.  Perform exercise routine regularly and verbalize modifications for safe progression Baseline: doing exercises consistently Goal status: in progress    PLAN:  PT FREQUENCY: 2x/week  PT DURATION: 8 weeks  PLANNED INTERVENTIONS: Therapeutic exercises, Therapeutic activity, Neuromuscular re-education, Balance training, Gait training, Patient/Family education, Self Care, Joint mobilization, Aquatic Therapy, Dry Needling, Spinal mobilization, Cryotherapy, Moist heat, Taping, Traction, Manual therapy, and Re-evaluation.  PLAN FOR NEXT SESSION:10th visit next.  FOTO  Sigurd Sos, PT 11/12/22 10:16 AM   [facility] [address], [place] [address], [place] [address] Phone #  [phone] Fax [phone]

## 2022-11-16 ENCOUNTER — Ambulatory Visit: Payer: Medicare PPO

## 2022-11-16 ENCOUNTER — Encounter: Payer: Self-pay | Admitting: Physical Therapy

## 2022-11-18 ENCOUNTER — Other Ambulatory Visit: Payer: Self-pay | Admitting: Family Medicine

## 2022-11-18 DIAGNOSIS — I10 Essential (primary) hypertension: Secondary | ICD-10-CM

## 2022-11-19 ENCOUNTER — Ambulatory Visit: Payer: Medicare PPO

## 2022-11-19 ENCOUNTER — Encounter: Payer: Self-pay | Admitting: Internal Medicine

## 2022-11-19 ENCOUNTER — Ambulatory Visit: Payer: Medicare PPO | Admitting: Internal Medicine

## 2022-11-19 VITALS — BP 120/70 | HR 70 | Temp 98.1°F | Wt 185.9 lb

## 2022-11-19 DIAGNOSIS — K861 Other chronic pancreatitis: Secondary | ICD-10-CM | POA: Diagnosis not present

## 2022-11-19 DIAGNOSIS — L309 Dermatitis, unspecified: Secondary | ICD-10-CM

## 2022-11-19 DIAGNOSIS — K529 Noninfective gastroenteritis and colitis, unspecified: Secondary | ICD-10-CM | POA: Diagnosis not present

## 2022-11-19 MED ORDER — TRIAMCINOLONE ACETONIDE 0.1 % EX CREA: 1.0000 | TOPICAL_CREAM | Freq: Two times a day (BID) | CUTANEOUS | 3 refills | Status: DC

## 2022-11-19 NOTE — Progress Notes (Signed)
Established Patient Office Visit     CC/Reason for Visit: Patches on leg  HPI: Walter Campbell is a 82 y.o. male who is coming in today for the above mentioned reasons.  This morning when he got out of the shower noticed some red scaly patches on the outer aspects of both lower legs.  Patches are erythematous with a silvery plaque-like scale over them.  He is not known to have psoriasis.  He is going through a difficult time as he just lost his wife this weekend.   Past Medical/Surgical History: Past Medical History:  Diagnosis Date   Arthritis    Diabetes mellitus    Hypertension    Palpitations 06/10/2010   Pancreatitis    Pneumonia    renal ca 05/20/2022    Past Surgical History:  Procedure Laterality Date   APPENDECTOMY  1957   COLONOSCOPY  12/19/2018   per Dr. 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   TONSILLECTOMY      Social History:  reports that he has never smoked. He has never used smokeless tobacco. He reports current alcohol use of about 1.0 - 7.0 standard drink of alcohol per week. He reports current drug use.  Allergies: No Known Allergies  Family History:  Family History  Problem Relation Age of Onset   Stroke Mother    Diabetes Mother    Osteoporosis Mother    Dementia Mother    Heart disease Mother        pacemaker   Hyperlipidemia Father        diet controlled   CAD Father        stents in 66s or 31s   Arthritis Sister    Hyperthyroidism Sister      Current Outpatient Medications:    atorvastatin (LIPITOR) 20 MG tablet, TAKE 1  TABLET BY MOUTH ON MONDAYS, WEDNESDAYS AND FRIDAYS, Disp: 36 tablet, Rfl: 0   benazepril-hydrochlorthiazide (LOTENSIN HCT) 20-25 MG tablet, TAKE 1 TABLET BY MOUTH EVERY DAY, Disp: 90 tablet, Rfl: 0   Continuous Blood Gluc Sensor (FREESTYLE LIBRE 2 SENSOR) MISC, CHANGE EVERY 14 DAYS, Disp: 6 each, Rfl: 2   CREON 36000-114000 units CPEP capsule, Take 1-2 capsules by mouth See admin instructions. Take 2 capsule with each meal and 1 capsule with a snack, Disp: , Rfl:    felodipine (PLENDIL) 5 MG 24 hr tablet, Take 1 tablet (5 mg total) by mouth daily., Disp: 90 tablet, Rfl: 3   glipiZIDE (GLUCOTROL) 5 MG tablet, Take 1 tablet (5 mg total) by mouth daily in the afternoon. TWO before breakfast and ONE and a HALF  tablets before Supper, Disp: 90 tablet, Rfl: 3   ibuprofen (ADVIL) 200 MG tablet, Take 200 mg by mouth every 6 (six) hours as needed for headache or moderate pain., Disp: , Rfl:    loratadine (CLARITIN) 10 MG tablet, Take 10 mg by mouth daily as needed for allergies., Disp: , Rfl:    metFORMIN (GLUCOPHAGE-XR) 500 MG 24 hr tablet, TAKE 2 TABLETS  BY MOUTH EVERY MORNING & AT BEDTIME, Disp: 360 tablet, Rfl: 0   OVER THE COUNTER MEDICATION, Apply 1 Application topically daily as needed (pain). CBD cream, Disp: , Rfl:    triamcinolone cream (KENALOG) 0.1 %, Apply 1 Application topically 2 (two) times daily., Disp: 45 g, Rfl: 3  Review of Systems:  Constitutional: Denies fever, chills, diaphoresis, appetite change and fatigue.  HEENT: Denies photophobia, eye pain, redness, hearing loss, ear pain, congestion, sore throat, rhinorrhea, sneezing, mouth sores, trouble swallowing, neck pain, neck stiffness and tinnitus.   Respiratory: Denies SOB, DOE, cough, chest tightness,  and wheezing.   Cardiovascular: Denies chest pain, palpitations and leg swelling.  Gastrointestinal: Denies nausea, vomiting, abdominal pain, diarrhea, constipation, blood in stool and abdominal distention.  Genitourinary: Denies  dysuria, urgency, frequency, hematuria, flank pain and difficulty urinating.  Endocrine: Denies: hot or cold intolerance, sweats, changes in hair or nails, polyuria, polydipsia. Musculoskeletal: Denies myalgias, back pain, joint swelling, arthralgias and gait problem.  Skin: Denies pallor,  and wound.  Neurological: Denies dizziness, seizures, syncope, weakness, light-headedness, numbness and headaches.  Hematological: Denies adenopathy. Easy bruising, personal or family bleeding history  Psychiatric/Behavioral: Denies suicidal ideation, mood changes, confusion, nervousness, sleep disturbance and agitation    Physical Exam: Vitals:   11/19/22 1555  BP: 120/70  Pulse: 70  Temp: 98.1 F (36.7 C)  TempSrc: Oral  SpO2: 98%  Weight: 185 lb 14.4 oz (84.3 kg)    Body mass index is 30.01 kg/m.   Constitutional: NAD, calm, comfortable Eyes: PERRL, lids and conjunctivae normal ENMT: Mucous membranes are moist.  Skin: About 5 quarter-sized erythematous patches with a silvery scale over the outer aspect of both lower legs.    Impression and Plan:  Eczema, unspecified type - Plan: triamcinolone cream (KENALOG) 0.1 %  -Appears to be eczema versus psoriasis.  Will try triamcinolone cream.  If no improvement referral to dermatology.  Time spent:22 minutes reviewing chart, interviewing and examining patient and formulating plan of care.     Lelon Frohlich, MD [facility]

## 2022-11-22 ENCOUNTER — Other Ambulatory Visit: Payer: Self-pay | Admitting: Family Medicine

## 2022-11-23 ENCOUNTER — Ambulatory Visit: Payer: Medicare PPO

## 2022-11-23 DIAGNOSIS — R293 Abnormal posture: Secondary | ICD-10-CM

## 2022-11-23 DIAGNOSIS — M5459 Other low back pain: Secondary | ICD-10-CM | POA: Diagnosis not present

## 2022-11-23 DIAGNOSIS — R252 Cramp and spasm: Secondary | ICD-10-CM

## 2022-11-23 NOTE — Therapy (Addendum)
OUTPATIENT PHYSICAL THERAPY TREATMENT   Patient Name: Walter Campbell MRN: [id number] DOB:[birth year], 82 y.o., male Today's Date: 11/23/2022 Progress Note Reporting Period 10/05/22 to 11/23/22  See note below for Objective Data and Assessment of Progress/Goals.     END OF SESSION:  PT End of Session - 11/23/22 1016     Visit Number 10    Date for PT Re-Evaluation 01/08/23   Authorization Type 16 visitis 10/05/22-11/30/22    Authorization - Visit Number 10    Authorization - Number of Visits 16    Progress Note Due on Visit 20    PT Start Time 0935    PT Stop Time 1018    PT Time Calculation (min) 43 min    Activity Tolerance Patient tolerated treatment well    Behavior During Therapy [facility] for tasks assessed/performed                   Past Medical History:  Diagnosis Date   Arthritis    Diabetes mellitus    Hypertension    Palpitations 06/10/2010   Pancreatitis    Pneumonia    renal ca 05/20/2022   Past Surgical History:  Procedure Laterality Date   APPENDECTOMY  1957   COLONOSCOPY  12/19/2018   per Dr. 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   TONSILLECTOMY     Patient Active Problem List   Diagnosis Date Noted   Right renal mass 05/20/2022   Type 2 diabetes mellitus without complication, without long-term current use of insulin ([place]) 03/10/2021   Piriformis syndrome of right side 04/26/2019   Anxiety state 11/27/2014   Hyperlipidemia 11/26/2014   GERD (gastroesophageal reflux disease) 11/14/2012   Type 2 diabetes mellitus without complications ([place])  19/37/9024   Essential hypertension 12/04/2009    PCP: Alysia Penna, MD   REFERRING PROVIDER: Earnie Larsson, MD  REFERRING DIAG: M43.10 (ICD-10-CM) - Spondylolisthesis, site unspecified   Rationale for Evaluation and Treatment: Rehabilitation  THERAPY DIAG:  Other low back pain - Plan: PT plan of care cert/re-cert  Cramp and spasm - Plan: PT plan of care cert/re-cert  Abnormal posture - Plan: PT plan of care cert/re-cert  ONSET DATE: 4-5 weeks   SUBJECTIVE:  SUBJECTIVE STATEMENT: Pt lost his wife last week.  I am stiff this morning.  PERTINENT HISTORY:  DM, HTN, renal cancer (05/2022)  PAIN:  Are you having pain? Yes: NPRS scale: 1-2/10  (aching) Pain location: Multiple joint aches today Pain description: aching  Aggravating factors: lifting, sitting too long Relieving factors: change of position, not lifting   PRECAUTIONS: Other: history of cancer   WEIGHT BEARING RESTRICTIONS: No  FALLS:  Has patient fallen in last 6 months? No  LIVING ENVIRONMENT: Lives with: lives with their spouse Lives in: House/apartment, has chair lift if needed  OCCUPATION: retired   PLOF: Independent and Leisure: golf 2x/wk, walk with wife  PATIENT GOALS: reduce LBP, prevent sciatic nerve pain from returning   NEXT MD VISIT: none   OBJECTIVE:   DIAGNOSTIC FINDINGS:  MRI: 08/16/22 IMPRESSION: 1. Transitional lumbosacral anatomy. 2. Degenerative changes of the lumbar spine, more pronounced at L5-S1 where there is moderate spinal canal stenosis with prominent narrowing of the bilateral subarticular zones and mild right neural foraminal narrowing. 3. Mild spinal canal stenosis with mild narrowing of the bilateral subarticular zones and mild bilateral neural foraminal narrowing at L3-4 and  L4-5.  PATIENT SURVEYS:  10/05/22: FOTO 67 (goal is 2) 11/23/22: 53 (goal is 72)    SCREENING FOR RED FLAGS: Bowel or bladder incontinence: No Spinal tumors: No Cauda equina syndrome: No Compression fracture: No Abdominal aneurysm: No  COGNITION: Overall cognitive status: Within functional limits for tasks assessed     SENSATION: WFL  MUSCLE LENGTH: Limited by 50-60% bilaterally  POSTURE: rounded shoulders, forward head, decreased lumbar lordosis, and flexed trunk   PALPATION: Reduced segmental mobility, tension in bil lumbar musculature and gluteals   LUMBAR ROM:  Lumbar A/ROM limited by 50% with limited segmental mobility in the lumbar spine  LOWER EXTREMITY ROM:    Hip IR limited by 50-60%, hamstring length limited by 50-60%, all other hip flexibility limited by 50%.    LOWER EXTREMITY MMT:    Hips 4/5, knees 4+/5 (limited knee extension due to shortened hamstrings)   LUMBAR SPECIAL TESTS:  Straight leg raise test: Negative  FUNCTIONAL TESTS:  Sit to stand without UE support  GAIT: Distance walked: 50 Assistive device utilized: None Level of assistance: Modified independence Comments: flexed trunk, wide base of support   TODAY'S TREATMENT:    11/23/22:  NuStep Level 4 x 9 minutes- PT monitored for pain Hip stretching in supine with strap Bil: frontal & sagittal planes Standing hip extension: 2x10 bil each Supine bridge 2x10 with 5 second hold Red band clamshells 2x15 with TA contraction: slight PPT Single knee to chest RT/LT/RT 20 sec  Sit to stand: holding 5# kettle bell 2x10 Manual: addaday to bil RTlow back and gluteals x 7 min  11/12/22:  NuStep Level 4 x 9 minutes- PT monitored for pain Hip stretching in supine with strap Bil: frontal & sagittal planes Standing hip extension: 2x10 bil each Supine bridge 10x2 5 sec hold with ball squeeze  Red band clamshells 2x15 with TA contraction: slight PPT Single knee to chest RT/LT/RT 20 sec  Sit to stand:  holding 5# kettle bell 2x10 Manual: addaday to bil RTlow back and gluteals x 10 min  11/11/22:  NuStep Level 4 x 9 minutes- PT monitored for pain Hip stretching in supine with strap Bil: frontal & sagittal planes Standing hip extension: 2x10 bil each Supine bridge 10x2 5 sec hold with ball squeeze  Red band clamshells 2x15 with TA contraction: slight PPT  Single knee to chest RT/LT/RT 20 sec  Sit to stand: holding 5# kettle bell 2x10 Manual: addaday to bil RTlow back and gluteals x 10 min   PATIENT EDUCATION:  Education details: Access Code: [email or web address] Person educated: Patient Education method: Explanation, Demonstration, and Handouts Education comprehension: verbalized understanding and returned demonstration  HOME EXERCISE PROGRAM: Access Code: [facility] URL: [email or web address] Date: 10/08/2022 Prepared by: Claiborne Billings  Exercises - Seated Hamstring Stretch  - 3 x daily - 7 x weekly - 1 sets - 3 reps - 20 hold - Hooklying Single Knee to Chest Stretch  - 3 x daily - 7 x weekly - 1 sets - 3 reps - 20 hold - Supine Lower Trunk Rotation  - 3 x daily - 7 x weekly - 1 sets - 3 reps - 2 hold - Seated Figure 4 Piriformis Stretch  - 3 x daily - 7 x weekly - 1 sets - 3 reps - 20 hold - Clamshell  - 1 x daily - 7 x weekly - 1-2 sets - 10 reps - Supine Hip Adduction Isometric with Ball  - 1 x daily - 7 x weekly - 1-2 sets - 10 reps - 5 hold  ASSESSMENT:  CLINICAL IMPRESSION:  Pt's wife passed away last week. He has not been able to exercise much do to this.  Pt reports 30-40% overall improvement in lumbar pain since the start of care.  Pt has not returned to a regular routine with Pilates due to caring for his wife, this is a goal for him. Patient will benefit from skilled PT to address the below impairments and improve overall function.    OBJECTIVE IMPAIRMENTS: decreased activity tolerance, decreased balance, decreased endurance, difficulty walking, decreased ROM, decreased  strength, hypomobility, increased muscle spasms, impaired flexibility, improper body mechanics, postural dysfunction, and pain.   ACTIVITY LIMITATIONS: sitting, standing, transfers, locomotion level, and caring for others  PARTICIPATION LIMITATIONS: meal prep, cleaning, laundry, shopping, and community activity  PERSONAL FACTORS: Age, Social background, and 1-2 comorbidities: lumbar DDD, cares for wife  are also affecting patient's functional outcome.   REHAB POTENTIAL: Good  CLINICAL DECISION MAKING: Evolving/moderate complexity  EVALUATION COMPLEXITY: Moderate   GOALS: Goals reviewed with patient? Yes  SHORT TERM GOALS: Target date: 11/03/2022   Be independent in initial HEP Baseline: Goal status: Goal me t 10/30/22  2.  Report > or = to 30% reduction in LBP with standing and walking  Baseline:  30-40% (11/23/22) Goal status: Goal met   3.  Initiate return to regular exercise routine including pilates  Baseline: doing stretches consistently (10/15/22) Goal status: in progress    LONG TERM GOALS: Target date: 01/08/23    Be independent in advanced HEP Baseline:  Goal status: In progress   2.  Improve FOTO to > or = to 72 Baseline: 53 (declined today although pt reports 30-40% improvement) Goal status: In progress   3.  Report > or = to 70% reduction in LBP with standing and walking  Baseline: 30-40% (11/23/22) Goal status: On going  4.  Verbalize and demonstrate body mechanics modifications with lifting and carrying for lumbar protection Baseline:  Goal status: Goal met: 10/30/22  5.  Perform exercise routine regularly and verbalize modifications for safe progression Baseline: doing exercises consistently Goal status: in progress    PLAN:  PT FREQUENCY: 2x/week  PT DURATION: 6 weeks  PLANNED INTERVENTIONS: Therapeutic exercises, Therapeutic activity, Neuromuscular re-education, Balance training, Gait training, Patient/Family education, Self Care, Joint  mobilization, Aquatic Therapy,  Dry Needling, Spinal mobilization, Cryotherapy, Moist heat, Taping, Traction, Manual therapy, and Re-evaluation.  PLAN FOR NEXT SESSION:  Lumbar flexibility, strength  Sigurd Sos, PT 11/23/22 10:24 AM   [facility] [address] [place], [place] [address] Phone # [phone] Fax [phone]

## 2022-11-26 ENCOUNTER — Ambulatory Visit: Payer: Medicare PPO

## 2022-11-26 DIAGNOSIS — R252 Cramp and spasm: Secondary | ICD-10-CM | POA: Diagnosis not present

## 2022-11-26 DIAGNOSIS — M5459 Other low back pain: Secondary | ICD-10-CM | POA: Diagnosis not present

## 2022-11-26 DIAGNOSIS — R293 Abnormal posture: Secondary | ICD-10-CM | POA: Diagnosis not present

## 2022-11-26 NOTE — Therapy (Signed)
OUTPATIENT PHYSICAL THERAPY TREATMENT   Patient Name: Walter Campbell MRN: [id number] DOB:18-Oct-1941, 82 y.o., male Today's Date: 11/26/2022    END OF SESSION:  PT End of Session - 11/26/22 1015     Visit Number 11    Date for PT Re-Evaluation 01/08/23    Authorization Type 16 visitis 10/05/22-11/30/22    Authorization - Visit Number 11    Authorization - Number of Visits 16    Progress Note Due on Visit 20    PT Start Time 0933    PT Stop Time 7356    PT Time Calculation (min) 41 min    Activity Tolerance Patient tolerated treatment well    Behavior During Therapy [facility] for tasks assessed/performed                    Past Medical History:  Diagnosis Date   Arthritis    Diabetes mellitus    Hypertension    Palpitations 06/10/2010   Pancreatitis    Pneumonia    renal ca 05/20/2022   Past Surgical History:  Procedure Laterality Date   APPENDECTOMY  1957   COLONOSCOPY  12/19/2018   per Dr. 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   TONSILLECTOMY     Patient Active Problem List   Diagnosis Date Noted   Right renal mass 05/20/2022   Type 2 diabetes mellitus without complication, without long-term current use of insulin ([place]) 03/10/2021   Piriformis syndrome of right side 04/26/2019   Anxiety state 11/27/2014   Hyperlipidemia 11/26/2014   GERD (gastroesophageal reflux disease) 11/14/2012   Type 2 diabetes mellitus without complications ([place]) 70/14/1030   Essential hypertension 12/04/2009    PCP: Alysia Penna, MD   REFERRING PROVIDER: Earnie Larsson,  MD  REFERRING DIAG: M43.10 (ICD-10-CM) - Spondylolisthesis, site unspecified   Rationale for Evaluation and Treatment: Rehabilitation  THERAPY DIAG:  Other low back pain  Cramp and spasm  Abnormal posture  ONSET DATE: 4-5 weeks   SUBJECTIVE:  SUBJECTIVE STATEMENT: Pt lost his wife last week.  I am stiff this morning.  PERTINENT HISTORY:  DM, HTN, renal cancer (05/2022)  PAIN:  Are you having pain? Yes: NPRS scale: 1-2/10  (aching) Pain location: Multiple joint aches today Pain description: aching  Aggravating factors: lifting, sitting too long Relieving factors: change of position, not lifting   PRECAUTIONS: Other: history of cancer   WEIGHT BEARING RESTRICTIONS: No  FALLS:  Has patient fallen in last 6 months? No  LIVING ENVIRONMENT: Lives with: lives with their spouse Lives in: House/apartment, has chair lift if needed  OCCUPATION: retired   PLOF: Independent and Leisure: golf 2x/wk, walk with wife  PATIENT GOALS: reduce LBP, prevent sciatic nerve pain from returning   NEXT MD VISIT: none   OBJECTIVE:   DIAGNOSTIC FINDINGS:  MRI: 08/16/22 IMPRESSION: 1. Transitional lumbosacral anatomy. 2. Degenerative changes of the lumbar spine, more pronounced at L5-S1 where there is moderate spinal canal stenosis with prominent narrowing of the bilateral subarticular zones and mild right neural foraminal narrowing. 3. Mild spinal canal stenosis with mild narrowing of the bilateral subarticular zones and mild bilateral neural foraminal narrowing at L3-4 and L4-5.  PATIENT SURVEYS:  10/05/22: FOTO 67 (goal is 18) 11/23/22: 53 (goal is 72)    SCREENING FOR RED FLAGS: Bowel or bladder incontinence: No Spinal tumors: No Cauda equina syndrome: No Compression fracture: No Abdominal  aneurysm: No  COGNITION: Overall cognitive status: Within functional limits for tasks assessed     SENSATION: WFL  MUSCLE LENGTH: Limited by 50-60% bilaterally  POSTURE: rounded shoulders, forward head, decreased lumbar lordosis, and flexed trunk   PALPATION: Reduced segmental mobility, tension in bil lumbar musculature and gluteals   LUMBAR ROM:  Lumbar A/ROM limited by 50% with limited segmental mobility in the lumbar spine  LOWER EXTREMITY ROM:    Hip IR limited by 50-60%, hamstring length limited by 50-60%, all other hip flexibility limited by 50%.    LOWER EXTREMITY MMT:    Hips 4/5, knees 4+/5 (limited knee extension due to shortened hamstrings)   LUMBAR SPECIAL TESTS:  Straight leg raise test: Negative  FUNCTIONAL TESTS:  Sit to stand without UE support  GAIT: Distance walked: 50 Assistive device utilized: None Level of assistance: Modified independence Comments: flexed trunk, wide base of support   TODAY'S TREATMENT:     11/25/22:  NuStep Level 4 x 9 minutes- PT monitored for pain Hip stretching in supine with strap Bil: frontal & sagittal planes Standing hip extension: 2x10 bil each Seated horizontal abduction: red 2x10 Supine bridge 2x10 with 5 second hold Red band clamshells 2x15 with TA contraction: slight PPT Single knee to chest RT/LT/RT 20 sec  Sit to stand: holding 5# kettle bell 2x10 Manual: addaday to bil Rt low back and gluteals x 7 min  11/23/22:  NuStep Level 4 x 9 minutes- PT monitored for pain Hip stretching in supine with strap Bil: frontal & sagittal planes Standing hip extension: 2x10 bil each Supine bridge 2x10 with 5 second hold Red band clamshells 2x15 with TA contraction: slight PPT Single knee to chest RT/LT/RT 20 sec  Sit to stand: holding 5# kettle bell 2x10 Manual: addaday to bil RTlow back and gluteals x 7 min  11/12/22:  NuStep Level 4 x 9 minutes- PT monitored for pain Hip stretching in supine with strap Bil: frontal &  sagittal planes Standing hip extension: 2x10 bil each Supine bridge 10x2 5 sec hold with ball squeeze  Red band clamshells 2x15 with  TA contraction: slight PPT Single knee to chest RT/LT/RT 20 sec  Sit to stand: holding 5# kettle bell 2x10 Manual: addaday to bil RTlow back and gluteals x 10 min   PATIENT EDUCATION:  Education details: Access Code: [email or web address] Person educated: Patient Education method: Explanation, Demonstration, and Handouts Education comprehension: verbalized understanding and returned demonstration  HOME EXERCISE PROGRAM: Access Code: [facility] URL: [email or web address] Date: 10/08/2022 Prepared by: Claiborne Billings  Exercises - Seated Hamstring Stretch  - 3 x daily - 7 x weekly - 1 sets - 3 reps - 20 hold - Hooklying Single Knee to Chest Stretch  - 3 x daily - 7 x weekly - 1 sets - 3 reps - 20 hold - Supine Lower Trunk Rotation  - 3 x daily - 7 x weekly - 1 sets - 3 reps - 2 hold - Seated Figure 4 Piriformis Stretch  - 3 x daily - 7 x weekly - 1 sets - 3 reps - 20 hold - Clamshell  - 1 x daily - 7 x weekly - 1-2 sets - 10 reps - Supine Hip Adduction Isometric with Ball  - 1 x daily - 7 x weekly - 1-2 sets - 10 reps - 5 hold  ASSESSMENT:  CLINICAL IMPRESSION:    Pt reports 30-40% overall improvement in lumbar pain since the start of care.  Pt has not returned to a regular routine with Pilates due to caring for his wife, this is a goal for him. Pt reports that he feels more flexible today and has gotten back to a more normal routine. Patient will benefit from skilled PT to address the below impairments and improve overall function.    OBJECTIVE IMPAIRMENTS: decreased activity tolerance, decreased balance, decreased endurance, difficulty walking, decreased ROM, decreased strength, hypomobility, increased muscle spasms, impaired flexibility, improper body mechanics, postural dysfunction, and pain.   ACTIVITY LIMITATIONS: sitting, standing, transfers, locomotion  level, and caring for others  PARTICIPATION LIMITATIONS: meal prep, cleaning, laundry, shopping, and community activity  PERSONAL FACTORS: Age, Social background, and 1-2 comorbidities: lumbar DDD, cares for wife  are also affecting patient's functional outcome.   REHAB POTENTIAL: Good  CLINICAL DECISION MAKING: Evolving/moderate complexity  EVALUATION COMPLEXITY: Moderate   GOALS: Goals reviewed with patient? Yes  SHORT TERM GOALS: Target date: 11/03/2022   Be independent in initial HEP Baseline: Goal status: Goal me t 10/30/22  2.  Report > or = to 30% reduction in LBP with standing and walking  Baseline:  30-40% (11/23/22) Goal status: Goal met   3.  Initiate return to regular exercise routine including pilates  Baseline: doing stretches consistently (10/15/22) Goal status: in progress    LONG TERM GOALS: Target date: 01/08/23    Be independent in advanced HEP Baseline:  Goal status: In progress   2.  Improve FOTO to > or = to 72 Baseline: 53 (declined today although pt reports 30-40% improvement) Goal status: In progress   3.  Report > or = to 70% reduction in LBP with standing and walking  Baseline: 30-40% (11/23/22) Goal status: On going  4.  Verbalize and demonstrate body mechanics modifications with lifting and carrying for lumbar protection Baseline:  Goal status: Goal met: 10/30/22  5.  Perform exercise routine regularly and verbalize modifications for safe progression Baseline: doing exercises consistently Goal status: in progress    PLAN:  PT FREQUENCY: 2x/week  PT DURATION: 6 weeks  PLANNED INTERVENTIONS: Therapeutic exercises, Therapeutic activity, Neuromuscular re-education, Balance training, Gait training, Patient/Family education, Self Care,  Joint mobilization, Aquatic Therapy, Dry Needling, Spinal mobilization, Cryotherapy, Moist heat, Taping, Traction, Manual therapy, and Re-evaluation.  PLAN FOR NEXT SESSION:  Lumbar flexibility,  strength, manual as needed.   Sigurd Sos, PT 11/26/22 10:16 AM   [facility] [address], [place] [address] [place], [place] [address] Phone # [phone] Fax [phone]

## 2022-11-26 NOTE — Therapy (Deleted)
OUTPATIENT PHYSICAL THERAPY TREATMENT   Patient Name: Walter Campbell MRN: [id number] DOB:Jun 15, 1941, 82 y.o., male Today's Date: 11/26/2022 Progress Note Reporting Period 10/05/22 to 11/23/22  See note below for Objective Data and Assessment of Progress/Goals.     END OF SESSION:          Past Medical History:  Diagnosis Date   Arthritis    Diabetes mellitus    Hypertension    Palpitations 06/10/2010   Pancreatitis    Pneumonia    renal ca 05/20/2022   Past Surgical History:  Procedure Laterality Date   APPENDECTOMY  1957   COLONOSCOPY  12/19/2018   per Dr. 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   TONSILLECTOMY     Patient Active Problem List   Diagnosis Date Noted   Right renal mass 05/20/2022   Type 2 diabetes mellitus without complication, without long-term current use of insulin ([place]) 03/10/2021   Piriformis syndrome of right side 04/26/2019   Anxiety state 11/27/2014   Hyperlipidemia 11/26/2014   GERD (gastroesophageal reflux disease) 11/14/2012   Type 2 diabetes mellitus without complications ([place]) 78/29/5621   Essential hypertension 12/04/2009    PCP: Alysia Penna, MD   REFERRING PROVIDER: Earnie Larsson, MD  REFERRING DIAG: M43.10 (ICD-10-CM) - Spondylolisthesis, site unspecified   Rationale for Evaluation and Treatment: Rehabilitation  THERAPY DIAG:  No diagnosis found.  ONSET DATE: 4-5 weeks   SUBJECTIVE:                                                                                                                                                                                            SUBJECTIVE STATEMENT: Pt lost his wife last week.  I am stiff this morning.  PERTINENT HISTORY:  DM, HTN, renal cancer (05/2022)  PAIN:  Are you having pain? Yes: NPRS scale: 1-2/10  (aching) Pain location: Multiple joint aches today Pain description: aching  Aggravating factors: lifting, sitting too long Relieving factors: change of position, not lifting   PRECAUTIONS: Other: history of cancer   WEIGHT BEARING RESTRICTIONS: No  FALLS:  Has patient fallen in last 6 months? No  LIVING ENVIRONMENT: Lives with: lives with their  spouse Lives in: House/apartment, has chair lift if needed  OCCUPATION: retired   PLOF: Independent and Leisure: golf 2x/wk, walk with wife  PATIENT GOALS: reduce LBP, prevent sciatic nerve pain from returning   NEXT MD VISIT: none   OBJECTIVE:   DIAGNOSTIC FINDINGS:  MRI: 08/16/22 IMPRESSION: 1. Transitional lumbosacral anatomy. 2. Degenerative changes of the lumbar spine, more pronounced at L5-S1 where there is moderate spinal canal stenosis with prominent narrowing of the bilateral subarticular zones and mild right neural foraminal narrowing. 3. Mild spinal canal stenosis with mild narrowing of the bilateral subarticular zones and mild bilateral neural foraminal narrowing at L3-4 and L4-5.  PATIENT SURVEYS:  10/05/22: FOTO 67 (goal is 60) 11/23/22: 53 (goal is 72)    SCREENING FOR RED FLAGS: Bowel or bladder incontinence: No Spinal tumors: No Cauda equina syndrome: No Compression fracture: No Abdominal aneurysm: No  COGNITION: Overall cognitive status: Within functional limits for tasks assessed     SENSATION: WFL  MUSCLE LENGTH: Limited by 50-60% bilaterally  POSTURE: rounded shoulders, forward head, decreased lumbar lordosis, and flexed trunk   PALPATION: Reduced segmental mobility, tension in bil lumbar musculature and gluteals   LUMBAR ROM:  Lumbar A/ROM limited by 50%  with limited segmental mobility in the lumbar spine  LOWER EXTREMITY ROM:    Hip IR limited by 50-60%, hamstring length limited by 50-60%, all other hip flexibility limited by 50%.    LOWER EXTREMITY MMT:    Hips 4/5, knees 4+/5 (limited knee extension due to shortened hamstrings)   LUMBAR SPECIAL TESTS:  Straight leg raise test: Negative  FUNCTIONAL TESTS:  Sit to stand without UE support  GAIT: Distance walked: 50 Assistive device utilized: None Level of assistance: Modified independence Comments: flexed trunk, wide base of support   TODAY'S TREATMENT:    11/26/22:  NuStep Level 4 x 9 minutes- PT monitored for pain Hip stretching in supine with strap Bil: frontal & sagittal planes Standing hip extension: 2x10 bil each Supine bridge 2x10 with 5 second hold Red band clamshells 2x15 with TA contraction: slight PPT Single knee to chest RT/LT/RT 20 sec  Sit to stand: holding 5# kettle bell 2x10 Manual: addaday to bil RTlow back and gluteals x 7 min  11/23/22:  NuStep Level 4 x 9 minutes- PT monitored for pain Hip stretching in supine with strap Bil: frontal & sagittal planes Standing hip extension: 2x10 bil each Supine bridge 2x10 with 5 second hold Red band clamshells 2x15 with TA contraction: slight PPT Single knee to chest RT/LT/RT 20 sec  Sit to stand: holding 5# kettle bell 2x10 Manual: addaday to bil RTlow back and gluteals x 7 min  11/12/22:  NuStep Level 4 x 9 minutes- PT monitored for pain Hip stretching in supine with strap Bil: frontal & sagittal planes Standing hip extension: 2x10 bil each Supine bridge 10x2 5 sec hold with ball squeeze  Red band clamshells 2x15 with TA contraction: slight PPT Single knee to chest RT/LT/RT 20 sec  Sit to stand: holding 5# kettle bell 2x10 Manual: addaday to bil RTlow back and gluteals x 10 min   PATIENT EDUCATION:  Education details: Access Code: [email or web address] Person educated: Patient Education method: Explanation,  Demonstration, and Handouts Education comprehension: verbalized understanding and returned demonstration  HOME EXERCISE PROGRAM: Access Code: [facility] URL: [email or web address] Date: 10/08/2022 Prepared by: Claiborne Billings  Exercises - Seated Hamstring Stretch  - 3 x daily - 7 x weekly - 1 sets - 3 reps - 20 hold - Hooklying Single  Knee to Chest Stretch  - 3 x daily - 7 x weekly - 1 sets - 3 reps - 20 hold - Supine Lower Trunk Rotation  - 3 x daily - 7 x weekly - 1 sets - 3 reps - 2 hold - Seated Figure 4 Piriformis Stretch  - 3 x daily - 7 x weekly - 1 sets - 3 reps - 20 hold - Clamshell  - 1 x daily - 7 x weekly - 1-2 sets - 10 reps - Supine Hip Adduction Isometric with Ball  - 1 x daily - 7 x weekly - 1-2 sets - 10 reps - 5 hold  ASSESSMENT:  CLINICAL IMPRESSION:  *** Pt's wife passed away last week. He has not been able to exercise much do to this.  Pt reports 30-40% overall improvement in lumbar pain since the start of care.  Pt has not returned to a regular routine with Pilates due to caring for his wife, this is a goal for him. Patient will benefit from skilled PT to address the below impairments and improve overall function.    OBJECTIVE IMPAIRMENTS: decreased activity tolerance, decreased balance, decreased endurance, difficulty walking, decreased ROM, decreased strength, hypomobility, increased muscle spasms, impaired flexibility, improper body mechanics, postural dysfunction, and pain.   ACTIVITY LIMITATIONS: sitting, standing, transfers, locomotion level, and caring for others  PARTICIPATION LIMITATIONS: meal prep, cleaning, laundry, shopping, and community activity  PERSONAL FACTORS: Age, Social background, and 1-2 comorbidities: lumbar DDD, cares for wife  are also affecting patient's functional outcome.   REHAB POTENTIAL: Good  CLINICAL DECISION MAKING: Evolving/moderate complexity  EVALUATION COMPLEXITY: Moderate   GOALS: Goals reviewed with patient?  Yes  SHORT TERM GOALS: Target date: 11/03/2022   Be independent in initial HEP Baseline: Goal status: Goal me t 10/30/22  2.  Report > or = to 30% reduction in LBP with standing and walking  Baseline:  30-40% (11/23/22) Goal status: Goal met   3.  Initiate return to regular exercise routine including pilates  Baseline: doing stretches consistently (10/15/22) Goal status: in progress    LONG TERM GOALS: Target date: 01/08/23    Be independent in advanced HEP Baseline:  Goal status: In progress   2.  Improve FOTO to > or = to 72 Baseline: 53 (declined today although pt reports 30-40% improvement) Goal status: In progress   3.  Report > or = to 70% reduction in LBP with standing and walking  Baseline: 30-40% (11/23/22) Goal status: On going  4.  Verbalize and demonstrate body mechanics modifications with lifting and carrying for lumbar protection Baseline:  Goal status: Goal met: 10/30/22  5.  Perform exercise routine regularly and verbalize modifications for safe progression Baseline: doing exercises consistently Goal status: in progress    PLAN:  PT FREQUENCY: 2x/week  PT DURATION: 6 weeks  PLANNED INTERVENTIONS: Therapeutic exercises, Therapeutic activity, Neuromuscular re-education, Balance training, Gait training, Patient/Family education, Self Care, Joint mobilization, Aquatic Therapy, Dry Needling, Spinal mobilization, Cryotherapy, Moist heat, Taping, Traction, Manual therapy, and Re-evaluation.  PLAN FOR NEXT SESSION:  Lumbar flexibility, strength  Gustavus Bryant, PT, DPT 11/26/22 9:10 AM   [facility] [address] [place],  [address] Phone # [phone] Fax [phone]

## 2022-12-03 ENCOUNTER — Encounter: Payer: Self-pay | Admitting: Family Medicine

## 2022-12-03 ENCOUNTER — Ambulatory Visit (INDEPENDENT_AMBULATORY_CARE_PROVIDER_SITE_OTHER): Payer: Medicare PPO | Admitting: Family Medicine

## 2022-12-03 VITALS — BP 120/78 | HR 66 | Temp 98.1°F | Ht 66.0 in | Wt 187.0 lb

## 2022-12-03 DIAGNOSIS — E119 Type 2 diabetes mellitus without complications: Secondary | ICD-10-CM | POA: Diagnosis not present

## 2022-12-03 DIAGNOSIS — N2889 Other specified disorders of kidney and ureter: Secondary | ICD-10-CM

## 2022-12-03 DIAGNOSIS — I10 Essential (primary) hypertension: Secondary | ICD-10-CM | POA: Diagnosis not present

## 2022-12-03 DIAGNOSIS — N401 Enlarged prostate with lower urinary tract symptoms: Secondary | ICD-10-CM | POA: Diagnosis not present

## 2022-12-03 DIAGNOSIS — F411 Generalized anxiety disorder: Secondary | ICD-10-CM

## 2022-12-03 DIAGNOSIS — K219 Gastro-esophageal reflux disease without esophagitis: Secondary | ICD-10-CM

## 2022-12-03 DIAGNOSIS — E785 Hyperlipidemia, unspecified: Secondary | ICD-10-CM | POA: Diagnosis not present

## 2022-12-03 DIAGNOSIS — N138 Other obstructive and reflux uropathy: Secondary | ICD-10-CM

## 2022-12-03 LAB — CBC WITH DIFFERENTIAL/PLATELET
Basophils Absolute: 0.1 10*3/uL (ref 0.0–0.1)
Basophils Relative: 1.3 % (ref 0.0–3.0)
Eosinophils Absolute: 0.2 10*3/uL (ref 0.0–0.7)
Eosinophils Relative: 2.8 % (ref 0.0–5.0)
HCT: 39.5 % (ref 39.0–52.0)
Hemoglobin: 13.2 g/dL (ref 13.0–17.0)
Lymphocytes Relative: 19.4 % (ref 12.0–46.0)
Lymphs Abs: 1.2 10*3/uL (ref 0.7–4.0)
MCHC: 33.4 g/dL (ref 30.0–36.0)
MCV: 87.9 fl (ref 78.0–100.0)
Monocytes Absolute: 0.4 10*3/uL (ref 0.1–1.0)
Monocytes Relative: 6.4 % (ref 3.0–12.0)
Neutro Abs: 4.3 10*3/uL (ref 1.4–7.7)
Neutrophils Relative %: 70.1 % (ref 43.0–77.0)
Platelets: 279 10*3/uL (ref 150.0–400.0)
RBC: 4.49 Mil/uL (ref 4.22–5.81)
RDW: 13.5 % (ref 11.5–15.5)
WBC: 6.1 10*3/uL (ref 4.0–10.5)

## 2022-12-03 LAB — HEPATIC FUNCTION PANEL
ALT: 22 U/L (ref 0–53)
AST: 18 U/L (ref 0–37)
Albumin: 4.1 g/dL (ref 3.5–5.2)
Alkaline Phosphatase: 82 U/L (ref 39–117)
Bilirubin, Direct: 0.1 mg/dL (ref 0.0–0.3)
Total Bilirubin: 0.5 mg/dL (ref 0.2–1.2)
Total Protein: 6.2 g/dL (ref 6.0–8.3)

## 2022-12-03 LAB — BASIC METABOLIC PANEL
BUN: 18 mg/dL (ref 6–23)
CO2: 33 mEq/L — ABNORMAL HIGH (ref 19–32)
Calcium: 8.7 mg/dL (ref 8.4–10.5)
Chloride: 99 mEq/L (ref 96–112)
Creatinine, Ser: 0.73 mg/dL (ref 0.40–1.50)
GFR: 86.11 mL/min (ref >60.00)
Glucose, Bld: 131 mg/dL — ABNORMAL HIGH (ref 70–99)
Potassium: 3 mEq/L — ABNORMAL LOW (ref 3.5–5.1)
Sodium: 143 mEq/L (ref 135–145)

## 2022-12-03 LAB — LIPID PANEL
Cholesterol: 104 mg/dL (ref 0–200)
HDL: 52.7 mg/dL (ref >39.00)
LDL Cholesterol: 40 mg/dL (ref 0–99)
NonHDL: 51.28
Total CHOL/HDL Ratio: 2
Triglycerides: 58 mg/dL (ref 0.0–149.0)
VLDL: 11.6 mg/dL (ref 0.0–40.0)

## 2022-12-03 LAB — PSA: PSA: 3.43 ng/mL (ref 0.10–4.00)

## 2022-12-03 LAB — TSH: TSH: 1.72 u[IU]/mL (ref 0.35–5.50)

## 2022-12-03 MED ORDER — ATORVASTATIN CALCIUM 20 MG PO TABS: ORAL_TABLET | ORAL | 3 refills | Status: DC

## 2022-12-03 MED ORDER — FELODIPINE ER 5 MG PO TB24: 5.0000 mg | ORAL_TABLET | Freq: Every day | ORAL | 3 refills | Status: DC

## 2022-12-03 MED ORDER — TAMSULOSIN HCL 0.4 MG PO CAPS: 0.4000 mg | ORAL_CAPSULE | Freq: Every day | ORAL | 3 refills | Status: DC

## 2022-12-03 MED ORDER — BENAZEPRIL-HYDROCHLOROTHIAZIDE 20-25 MG PO TABS: 1.0000 | ORAL_TABLET | Freq: Every day | ORAL | 3 refills | Status: DC

## 2022-12-03 NOTE — Progress Notes (Signed)
Subjective:    Patient ID: Walter Campbell, male    DOB: Sep 19, 1941, 82 y.o.   MRN: [id number]  HPI Here to follow up on issues. He fels well in general. 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 His wife passed away 2 weeks ago after an extended illness. He says he is doing well. He plans to join a grief recovery group soon sponsored by [facility]. On 10-15-22 he underwent a cryoablation procedure on a right renal mass. No bopsies were ever taken. On a follow up CT, everything looked good. He sees Dr. Lovena Neighbours for urologic care. He will have a follow up scan in one year. Dr. Lovena Neighbours also started him on Tamsulosin for nocturia. This has worked well, decreasing his night time urinations from 4-5 to 1. His BP is stable. He sees Dr. Kelton Pillar for his diabetes, and his A1c on 09-30-22 was 6.6%.    Review of Systems  Constitutional: Negative.   HENT: Negative.    Eyes: Negative.   Respiratory: Negative.    Cardiovascular: Negative.   Gastrointestinal: Negative.   Genitourinary: Negative.   Musculoskeletal:  Positive for arthralgias.  Skin: Negative.   Neurological: Negative.   Psychiatric/Behavioral: Negative.         Objective:   Physical Exam Constitutional:      General: He is not in acute distress.    Appearance: Normal appearance. He is well-developed. He is not diaphoretic.  HENT:     Head: Normocephalic and atraumatic.     Right Ear: External ear normal.     Left Ear: External ear normal.     Nose: Nose normal.     Mouth/Throat:     Pharynx: No oropharyngeal exudate.  Eyes:     General: No scleral icterus.       Right eye: No discharge.        Left eye: No discharge.     Conjunctiva/sclera: Conjunctivae normal.     Pupils: Pupils are equal, round, and reactive to light.  Neck:     Thyroid: No thyromegaly.     Vascular: No JVD.     Trachea: No tracheal deviation.  Cardiovascular:     Rate and Rhythm: Normal rate and regular rhythm.     Heart sounds: Normal heart  sounds. No murmur heard.    No friction rub. No gallop.  Pulmonary:     Effort: Pulmonary effort is normal. No respiratory distress.     Breath sounds: Normal breath sounds. No wheezing or rales.  Chest:     Chest wall: No tenderness.  Abdominal:     General: Bowel sounds are normal. There is no distension.     Palpations: Abdomen is soft. There is no mass.     Tenderness: There is no abdominal tenderness. There is no guarding or rebound.  Genitourinary:    Penis: Normal. No tenderness.      Testes: Normal.  Musculoskeletal:        General: No tenderness. Normal range of motion.     Cervical back: Neck supple.  Lymphadenopathy:     Cervical: No cervical adenopathy.  Skin:    General: Skin is warm and dry.     Coloration: Skin is not pale.     Findings: No erythema or rash.  Neurological:     Mental Status: He is alert and oriented to person, place, and time.     Cranial Nerves: No cranial nerve deficit.     Motor: No  abnormal muscle tone.     Coordination: Coordination normal.     Deep Tendon Reflexes: Reflexes are normal and symmetric. Reflexes normal.  Psychiatric:        Behavior: Behavior normal.        Thought Content: Thought content normal.        Judgment: Judgment normal.           Assessment & Plan:  He has recovered from a cryoablation to a right renal mass. He will see Urology in one year. His HTN and diabetes are stable. His BPH is well controlled. His GERD is stable. Get fasting labs today.We spent a total of ( 33  ) minutes reviewing records and discussing these issues.  Alysia Penna, MD

## 2022-12-04 ENCOUNTER — Other Ambulatory Visit: Payer: Self-pay

## 2022-12-04 DIAGNOSIS — E876 Hypokalemia: Secondary | ICD-10-CM

## 2022-12-04 MED ORDER — POTASSIUM CHLORIDE CRYS ER 10 MEQ PO TBCR: 10.0000 meq | EXTENDED_RELEASE_TABLET | Freq: Every day | ORAL | 3 refills | Status: DC

## 2022-12-06 NOTE — Therapy (Unsigned)
OUTPATIENT PHYSICAL THERAPY TREATMENT   Patient Name: Walter Campbell MRN: [id number] DOB:May 04, 1941, 82 y.o., male Today's Date: 12/07/2022    END OF SESSION:  PT End of Session - 12/07/22 1141     Visit Number 12    Date for PT Re-Evaluation 01/08/23    Authorization Type 16 visitis 10/05/22-11/30/22    Authorization - Visit Number 12    Authorization - Number of Visits 16    Progress Note Due on Visit 20    PT Start Time 0881    PT Stop Time 1220    PT Time Calculation (min) 39 min    Activity Tolerance Patient tolerated treatment well    Behavior During Therapy [facility] for tasks assessed/performed                     Past Medical History:  Diagnosis Date   Arthritis    Diabetes mellitus    Hypertension    Palpitations 06/10/2010   Pancreatitis    Pneumonia    renal ca 05/20/2022   Past Surgical History:  Procedure Laterality Date   APPENDECTOMY  1957   COLONOSCOPY  12/19/2018   per Dr. 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   TONSILLECTOMY     Patient Active Problem List   Diagnosis Date Noted   BPH with urinary obstruction 12/03/2022   Right renal mass 05/20/2022   Type 2 diabetes mellitus without complication, without long-term current use of insulin ([place]) 03/10/2021   Piriformis syndrome of right side 04/26/2019   Anxiety state 11/27/2014   Hyperlipidemia 11/26/2014   GERD (gastroesophageal reflux disease) 11/14/2012   Essential hypertension 12/04/2009    PCP: Alysia Penna, MD   REFERRING PROVIDER: Earnie Larsson, MD  REFERRING DIAG: M43.10  (ICD-10-CM) - Spondylolisthesis, site unspecified   Rationale for Evaluation and Treatment: Rehabilitation  THERAPY DIAG:  Cramp and spasm  Other low back pain  Abnormal posture  ONSET DATE: 4-5 weeks   SUBJECTIVE:  SUBJECTIVE STATEMENT: I hurt my back moving some furniture over the weekend but I got through it doing my stretches.   PERTINENT HISTORY:  DM, HTN, renal cancer (05/2022)  PAIN:  Are you having pain? Yes: NPRS scale: 1-2/10  (aching) Pain location: Multiple joint aches today Pain description: aching  Aggravating factors: lifting, sitting too long Relieving factors: change of position, not lifting   PRECAUTIONS: Other: history of cancer   WEIGHT BEARING RESTRICTIONS: No  FALLS:  Has patient fallen in last 6 months? No  LIVING ENVIRONMENT: Lives with: lives with their spouse Lives in: House/apartment, has chair lift if needed  OCCUPATION: retired   PLOF: Independent and Leisure: golf 2x/wk, walk with wife  PATIENT GOALS: reduce LBP, prevent sciatic nerve pain from returning   NEXT MD VISIT: none   OBJECTIVE:   DIAGNOSTIC FINDINGS:  MRI: 08/16/22 IMPRESSION: 1. Transitional lumbosacral anatomy. 2. Degenerative changes of the lumbar spine, more pronounced at L5-S1 where there is moderate spinal canal stenosis with prominent narrowing of the bilateral subarticular zones and mild right neural foraminal narrowing. 3. Mild spinal canal stenosis with mild narrowing of the bilateral subarticular zones and mild bilateral neural foraminal narrowing at L3-4 and L4-5.  PATIENT SURVEYS:  10/05/22: FOTO 67 (goal is 90) 11/23/22: 53 (goal is 72)    SCREENING FOR RED FLAGS: Bowel or bladder incontinence: No Spinal tumors: No Cauda equina syndrome: No Compression fracture:  No Abdominal aneurysm: No  COGNITION: Overall cognitive status: Within functional limits for tasks assessed     SENSATION: WFL  MUSCLE LENGTH: Limited by 50-60% bilaterally  POSTURE: rounded shoulders, forward head, decreased lumbar lordosis, and flexed trunk   PALPATION: Reduced segmental mobility, tension in bil lumbar musculature and gluteals   LUMBAR ROM:  Lumbar A/ROM limited by 50% with limited segmental mobility in the lumbar spine  LOWER EXTREMITY ROM:    Hip IR limited by 50-60%, hamstring length limited by 50-60%, all other hip flexibility limited by 50%.    LOWER EXTREMITY MMT:    Hips 4/5, knees 4+/5 (limited knee extension due to shortened hamstrings)   LUMBAR SPECIAL TESTS:  Straight leg raise test: Negative  FUNCTIONAL TESTS:  Sit to stand without UE support  GAIT: Distance walked: 50 Assistive device utilized: None Level of assistance: Modified independence Comments: flexed trunk, wide base of support   TODAY'S TREATMENT:     12/07/22:  NuStep Level 4 x 10 minutes- PTA monitored for pain SKC stretch 1x 30 sec Bil Yellow loop clamshells 15x with PPT Lower trunk rotation with Pilates breath 4x Bil 2x Hip flexor stretch standing at second step 2x 3 breaths Standing hip abduction & ext 15x Bil Manual: addaday to bil LT low back and gluteals x 7 min  11/25/22:  NuStep Level 4 x 9 minutes- PT monitored for pain Hip stretching in supine with strap Bil: frontal & sagittal planes Standing hip extension: 2x10 bil each Seated horizontal abduction: red 2x10 Supine bridge 2x10 with 5 second hold Red band clamshells 2x15 with TA contraction: slight PPT Single knee to chest RT/LT/RT 20 sec  Sit to stand: holding 5# kettle bell 2x10   11/23/22:  NuStep Level 4 x 9 minutes- PT monitored for pain Hip stretching in supine with strap Bil: frontal & sagittal planes Standing hip extension: 2x10 bil each Supine bridge 2x10 with 5 second hold Red band clamshells  2x15 with TA contraction: slight PPT Single knee to chest RT/LT/RT 20 sec  Sit to stand: holding 5#  kettle bell 2x10 Manual: addaday to bil RTlow back and gluteals x 7 min  PATIENT EDUCATION:  Education details: Access Code: [email or web address] Person educated: Patient Education method: Explanation, Demonstration, and Handouts Education comprehension: verbalized understanding and returned demonstration  HOME EXERCISE PROGRAM: Access Code: [facility] URL: [email or web address] Date: 10/08/2022 Prepared by: Claiborne Billings  Exercises - Seated Hamstring Stretch  - 3 x daily - 7 x weekly - 1 sets - 3 reps - 20 hold - Hooklying Single Knee to Chest Stretch  - 3 x daily - 7 x weekly - 1 sets - 3 reps - 20 hold - Supine Lower Trunk Rotation  - 3 x daily - 7 x weekly - 1 sets - 3 reps - 2 hold - Seated Figure 4 Piriformis Stretch  - 3 x daily - 7 x weekly - 1 sets - 3 reps - 20 hold - Clamshell  - 1 x daily - 7 x weekly - 1-2 sets - 10 reps - Supine Hip Adduction Isometric with Ball  - 1 x daily - 7 x weekly - 1-2 sets - 10 reps - 5 hold  ASSESSMENT:  CLINICAL IMPRESSION: Pt doing well overall despite great home challenges. Pt had injured his back over the weekend moving furniture but reports doing his stretches he has learned helped him get through it and it is feeling much better now. Pt was able to get on his Reformer 1x this weekend.      OBJECTIVE IMPAIRMENTS: decreased activity tolerance, decreased balance, decreased endurance, difficulty walking, decreased ROM, decreased strength, hypomobility, increased muscle spasms, impaired flexibility, improper body mechanics, postural dysfunction, and pain.   ACTIVITY LIMITATIONS: sitting, standing, transfers, locomotion level, and caring for others  PARTICIPATION LIMITATIONS: meal prep, cleaning, laundry, shopping, and community activity  PERSONAL FACTORS: Age, Social background, and 1-2 comorbidities: lumbar DDD, cares for wife  are also affecting  patient's functional outcome.   REHAB POTENTIAL: Good  CLINICAL DECISION MAKING: Evolving/moderate complexity  EVALUATION COMPLEXITY: Moderate   GOALS: Goals reviewed with patient? Yes  SHORT TERM GOALS: Target date: 11/03/2022   Be independent in initial HEP Baseline: Goal status: Goal me t 10/30/22  2.  Report > or = to 30% reduction in LBP with standing and walking  Baseline:  30-40% (11/23/22) Goal status: Goal met   3.  Initiate return to regular exercise routine including pilates  Baseline: doing stretches consistently (10/15/22) Goal status: in progress    LONG TERM GOALS: Target date: 01/08/23    Be independent in advanced HEP Baseline:  Goal status: In progress   2.  Improve FOTO to > or = to 72 Baseline: 53 (declined today although pt reports 30-40% improvement) Goal status: In progress   3.  Report > or = to 70% reduction in LBP with standing and walking  Baseline: 30-40% (11/23/22) Goal status: On going  4.  Verbalize and demonstrate body mechanics modifications with lifting and carrying for lumbar protection Baseline:  Goal status: Goal met: 10/30/22  5.  Perform exercise routine regularly and verbalize modifications for safe progression Baseline: doing exercises consistently Goal status: in progress    PLAN:  PT FREQUENCY: 2x/week  PT DURATION: 6 weeks  PLANNED INTERVENTIONS: Therapeutic exercises, Therapeutic activity, Neuromuscular re-education, Balance training, Gait training, Patient/Family education, Self Care, Joint mobilization, Aquatic Therapy, Dry Needling, Spinal mobilization, Cryotherapy, Moist heat, Taping, Traction, Manual therapy, and Re-evaluation.  PLAN FOR NEXT SESSION:  Lumbar flexibility, strength, manual as needed.   Myrene Galas, PTA 12/07/22 2:43 PM  [facility] [address], [place], [place] [address] Phone # [phone] Fax [phone]

## 2022-12-07 ENCOUNTER — Ambulatory Visit: Payer: Medicare PPO | Admitting: Physical Therapy

## 2022-12-07 ENCOUNTER — Encounter: Payer: Self-pay | Admitting: Physical Therapy

## 2022-12-07 DIAGNOSIS — M5459 Other low back pain: Secondary | ICD-10-CM | POA: Diagnosis not present

## 2022-12-07 DIAGNOSIS — R293 Abnormal posture: Secondary | ICD-10-CM

## 2022-12-07 DIAGNOSIS — R252 Cramp and spasm: Secondary | ICD-10-CM | POA: Diagnosis not present

## 2022-12-08 ENCOUNTER — Ambulatory Visit: Payer: Medicare PPO | Admitting: Family Medicine

## 2022-12-08 ENCOUNTER — Encounter: Payer: Self-pay | Admitting: Family Medicine

## 2022-12-08 VITALS — BP 124/68 | HR 71 | Temp 98.3°F | Wt 189.0 lb

## 2022-12-08 DIAGNOSIS — E538 Deficiency of other specified B group vitamins: Secondary | ICD-10-CM | POA: Diagnosis not present

## 2022-12-08 DIAGNOSIS — R200 Anesthesia of skin: Secondary | ICD-10-CM | POA: Diagnosis not present

## 2022-12-08 DIAGNOSIS — E876 Hypokalemia: Secondary | ICD-10-CM

## 2022-12-08 DIAGNOSIS — R202 Paresthesia of skin: Secondary | ICD-10-CM

## 2022-12-08 LAB — BASIC METABOLIC PANEL
BUN: 18 mg/dL (ref 6–23)
CO2: 33 mEq/L — ABNORMAL HIGH (ref 19–32)
Calcium: 8.6 mg/dL (ref 8.4–10.5)
Chloride: 100 mEq/L (ref 96–112)
Creatinine, Ser: 0.8 mg/dL (ref 0.40–1.50)
GFR: 83.75 mL/min (ref >60.00)
Glucose, Bld: 237 mg/dL — ABNORMAL HIGH (ref 70–99)
Potassium: 3.2 mEq/L — ABNORMAL LOW (ref 3.5–5.1)
Sodium: 141 mEq/L (ref 135–145)

## 2022-12-08 LAB — VITAMIN B12: Vitamin B-12: 175 pg/mL — ABNORMAL LOW (ref 211–911)

## 2022-12-08 NOTE — Progress Notes (Signed)
   Subjective:    Patient ID: Walter Campbell, male    DOB: January 17, 1941, 82 y.o.   MRN: [id number]  HPI Here for the onset 4 days ago of tingling in the left foot. He denies any numbness or pain. No swelling. No back pain. No recent trauma. He had full labs drawn a week ago and these were normal except for a potassium of 3.0. He has started taking a potassium pill since then.    Review of Systems  Constitutional: Negative.   Respiratory: Negative.    Cardiovascular: Negative.   Neurological:  Negative for weakness and numbness.       Objective:   Physical Exam Constitutional:      Appearance: Normal appearance.  Cardiovascular:     Rate and Rhythm: Normal rate and regular rhythm.     Pulses: Normal pulses.     Heart sounds: Normal heart sounds.  Pulmonary:     Effort: Pulmonary effort is normal.     Breath sounds: Normal breath sounds.  Musculoskeletal:     Comments: Left foot appears normal with no swelling. Skin is pink and warm. Full PT and DP pulses. Sensation to light touch is intact.   Neurological:     Mental Status: He is alert.           Assessment & Plan:  Tingling in the left foot, likely due to either a neuropathy or a tarsal tunnel syndrome. He will take the potassium as above. We will check a B12 level today and go from there. Alysia Penna, MD

## 2022-12-08 NOTE — Addendum Note (Signed)
Addended by: Rosalyn Gess D on: 12/08/2022 02:55 PM   Modules accepted: Orders

## 2022-12-11 ENCOUNTER — Other Ambulatory Visit: Payer: Self-pay

## 2022-12-11 DIAGNOSIS — E538 Deficiency of other specified B group vitamins: Secondary | ICD-10-CM

## 2022-12-11 DIAGNOSIS — E876 Hypokalemia: Secondary | ICD-10-CM

## 2022-12-11 MED ORDER — POTASSIUM CHLORIDE CRYS ER 10 MEQ PO TBCR: 10.0000 meq | EXTENDED_RELEASE_TABLET | Freq: Two times a day (BID) | ORAL | 2 refills | Status: DC

## 2022-12-15 ENCOUNTER — Ambulatory Visit: Payer: Medicare PPO | Attending: Neurosurgery

## 2022-12-15 ENCOUNTER — Ambulatory Visit (INDEPENDENT_AMBULATORY_CARE_PROVIDER_SITE_OTHER): Payer: Medicare PPO

## 2022-12-15 DIAGNOSIS — R252 Cramp and spasm: Secondary | ICD-10-CM

## 2022-12-15 DIAGNOSIS — E538 Deficiency of other specified B group vitamins: Secondary | ICD-10-CM | POA: Diagnosis not present

## 2022-12-15 DIAGNOSIS — M5459 Other low back pain: Secondary | ICD-10-CM

## 2022-12-15 DIAGNOSIS — R293 Abnormal posture: Secondary | ICD-10-CM

## 2022-12-15 MED ORDER — CYANOCOBALAMIN 1000 MCG/ML IJ SOLN
1000.0000 ug | Freq: Once | INTRAMUSCULAR | Status: AC
  Administered 2022-12-15: 1000 ug via INTRAMUSCULAR

## 2022-12-15 NOTE — Progress Notes (Signed)
Per orders of Dr. Sarajane Jews, injection of Cyanocobalamin Inj. 1000 mcg given by Encarnacion Slates on Right Deltoid.  Patient tolerated injection well.  Patient is scheduled on 12/22/2022 for his next B12 injection.

## 2022-12-15 NOTE — Therapy (Signed)
OUTPATIENT PHYSICAL THERAPY TREATMENT   Patient Name: Walter Campbell MRN: [id number] DOB:02-04-1941, 82 y.o., male Today's Date: 12/15/2022    END OF SESSION:  PT End of Session - 12/15/22 1143     Visit Number 13    Date for PT Re-Evaluation 01/08/23    Authorization Type 16 visitis 10/05/22-11/30/22    Authorization - Visit Number 13    Authorization - Number of Visits 16    Progress Note Due on Visit 20    PT Start Time 1102    PT Stop Time 1140    PT Time Calculation (min) 38 min    Activity Tolerance Patient tolerated treatment well    Behavior During Therapy [facility] for tasks assessed/performed                      Past Medical History:  Diagnosis Date   Arthritis    Diabetes mellitus    Hypertension    Palpitations 06/10/2010   Pancreatitis    Pneumonia    renal ca 05/20/2022   Past Surgical History:  Procedure Laterality Date   APPENDECTOMY  1957   COLONOSCOPY  12/19/2018   per Dr. 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   TONSILLECTOMY     Patient Active Problem List   Diagnosis Date Noted   BPH with urinary obstruction 12/03/2022   Right renal mass 05/20/2022   Type 2 diabetes mellitus without complication, without long-term current use of insulin ([place]) 03/10/2021   Piriformis syndrome of right side 04/26/2019   Anxiety state 11/27/2014   Hyperlipidemia 11/26/2014   GERD (gastroesophageal reflux disease) 11/14/2012   Essential hypertension 12/04/2009    PCP: Alysia Penna, MD   REFERRING PROVIDER: Earnie Larsson, MD  REFERRING DIAG: M43.10  (ICD-10-CM) - Spondylolisthesis, site unspecified   Rationale for Evaluation and Treatment: Rehabilitation  THERAPY DIAG:  Cramp and spasm  Other low back pain  Abnormal posture  ONSET DATE: 4-5 weeks   SUBJECTIVE:  SUBJECTIVE STATEMENT: I'm not sleeping well.  I went and did a Pilates refresher last week.  I am doing better with my breathing now.    PERTINENT HISTORY:  DM, HTN, renal cancer (05/2022)  PAIN:  Are you having pain? Yes: NPRS scale: 0-1/10  (aching) Pain location: Multiple joint aches today Pain description: aching  Aggravating factors: lifting, sitting too long Relieving factors: change of position, not lifting   PRECAUTIONS: Other: history of cancer   WEIGHT BEARING RESTRICTIONS: No  FALLS:  Has patient fallen in last 6 months? No  LIVING ENVIRONMENT: Lives with: lives with their spouse Lives in: House/apartment, has chair lift if needed  OCCUPATION: retired   PLOF: Independent and Leisure: golf 2x/wk, walk with wife  PATIENT GOALS: reduce LBP, prevent sciatic nerve pain from returning   NEXT MD VISIT: none   OBJECTIVE:   DIAGNOSTIC FINDINGS:  MRI: 08/16/22 IMPRESSION: 1. Transitional lumbosacral anatomy. 2. Degenerative changes of the lumbar spine, more pronounced at L5-S1 where there is moderate spinal canal stenosis with prominent narrowing of the bilateral subarticular zones and mild right neural foraminal narrowing. 3. Mild spinal canal stenosis with mild narrowing of the bilateral subarticular zones and mild bilateral neural foraminal narrowing at L3-4 and L4-5.  PATIENT SURVEYS:  10/05/22: FOTO 67 (goal is 35) 11/23/22: 53 (goal is 72)    SCREENING FOR RED FLAGS: Bowel or bladder incontinence: No Spinal tumors: No Cauda equina syndrome:  No Compression fracture: No Abdominal aneurysm: No  COGNITION: Overall cognitive status: Within functional limits for tasks assessed     SENSATION: WFL  MUSCLE LENGTH: Limited by 50-60% bilaterally  POSTURE: rounded shoulders, forward head, decreased lumbar lordosis, and flexed trunk   PALPATION: Reduced segmental mobility, tension in bil lumbar musculature and gluteals   LUMBAR ROM:  Lumbar A/ROM limited by 50% with limited segmental mobility in the lumbar spine  LOWER EXTREMITY ROM:    Hip IR limited by 50-60%, hamstring length limited by 50-60%, all other hip flexibility limited by 50%.    LOWER EXTREMITY MMT:    Hips 4/5, knees 4+/5 (limited knee extension due to shortened hamstrings)   LUMBAR SPECIAL TESTS:  Straight leg raise test: Negative  FUNCTIONAL TESTS:  Sit to stand without UE support  GAIT: Distance walked: 50 Assistive device utilized: None Level of assistance: Modified independence Comments: flexed trunk, wide base of support   TODAY'S TREATMENT:    12/15/22:  NuStep Level 4 x 10 minutes- PTA monitored for pain SKC stretch 1x 30 sec Bil Yellow loop clamshells 2x10 with PPT Bridge with ball squeeze 5" x20 Lower trunk rotation with Pilates breath 4x Bil 2x Hip flexor stretch standing at second step 2x 3 breaths Standing hip abduction & ext 15x Bil- standing on balance pad Manual: addaday to bil Lt low back and gluteals x 10 min  12/07/22:  NuStep Level 4 x 10 minutes- PTA monitored for pain SKC stretch 1x 30 sec Bil Yellow loop clamshells 15x with PPT Lower trunk rotation with Pilates breath 4x Bil 2x Hip flexor stretch standing at second step 2x 3 breaths Standing hip abduction & ext 15x Bil Manual: addaday to bil LT low back and gluteals x 7 min  11/25/22:  NuStep Level 4 x 9 minutes- PT monitored for pain Hip stretching in supine with strap Bil: frontal & sagittal planes Standing hip extension: 2x10 bil each Seated horizontal abduction: red  2x10 Supine bridge 2x10 with 5 second hold Red band clamshells 2x15 with TA contraction:  slight PPT Single knee to chest RT/LT/RT 20 sec  Sit to stand: holding 5# kettle bell 2x10  PATIENT EDUCATION:  Education details: Access Code: [email or web address] Person educated: Patient Education method: Explanation, Demonstration, and Handouts Education comprehension: verbalized understanding and returned demonstration  HOME EXERCISE PROGRAM: Access Code: [facility] URL: [email or web address] Date: 10/08/2022 Prepared by: Claiborne Billings  Exercises - Seated Hamstring Stretch  - 3 x daily - 7 x weekly - 1 sets - 3 reps - 20 hold - Hooklying Single Knee to Chest Stretch  - 3 x daily - 7 x weekly - 1 sets - 3 reps - 20 hold - Supine Lower Trunk Rotation  - 3 x daily - 7 x weekly - 1 sets - 3 reps - 2 hold - Seated Figure 4 Piriformis Stretch  - 3 x daily - 7 x weekly - 1 sets - 3 reps - 20 hold - Clamshell  - 1 x daily - 7 x weekly - 1-2 sets - 10 reps - Supine Hip Adduction Isometric with Ball  - 1 x daily - 7 x weekly - 1-2 sets - 10 reps - 5 hold  ASSESSMENT:  CLINICAL IMPRESSION: Pt has returned to regular Pilates after doing a refresher class last week.  He is more consistent with his breathing now. Pt reports 1/10 lumbar pain.  Pt required minor tactile and verbal cues for alignment throughout session.  Pt responds well to Addaday for tissue mobility.  Patient will benefit from skilled PT to address the below impairments and improve overall function.      OBJECTIVE IMPAIRMENTS: decreased activity tolerance, decreased balance, decreased endurance, difficulty walking, decreased ROM, decreased strength, hypomobility, increased muscle spasms, impaired flexibility, improper body mechanics, postural dysfunction, and pain.   ACTIVITY LIMITATIONS: sitting, standing, transfers, locomotion level, and caring for others  PARTICIPATION LIMITATIONS: meal prep, cleaning, laundry, shopping, and community  activity  PERSONAL FACTORS: Age, Social background, and 1-2 comorbidities: lumbar DDD, cares for wife  are also affecting patient's functional outcome.   REHAB POTENTIAL: Good  CLINICAL DECISION MAKING: Evolving/moderate complexity  EVALUATION COMPLEXITY: Moderate   GOALS: Goals reviewed with patient? Yes  SHORT TERM GOALS: Target date: 11/03/2022   Be independent in initial HEP Baseline: Goal status: Goal me t 10/30/22  2.  Report > or = to 30% reduction in LBP with standing and walking  Baseline:  30-40% (11/23/22) Goal status: Goal met   3.  Initiate return to regular exercise routine including pilates  Baseline: doing stretches consistently (10/15/22) Goal status: in progress    LONG TERM GOALS: Target date: 01/08/23    Be independent in advanced HEP Baseline:  Goal status: In progress   2.  Improve FOTO to > or = to 72 Baseline: 53 (declined today although pt reports 30-40% improvement) Goal status: In progress   3.  Report > or = to 70% reduction in LBP with standing and walking  Baseline: 30-40% (11/23/22) Goal status: On going  4.  Verbalize and demonstrate body mechanics modifications with lifting and carrying for lumbar protection Baseline:  Goal status: Goal met: 10/30/22  5.  Perform exercise routine regularly and verbalize modifications for safe progression Baseline: doing exercises consistently Goal status: in progress    PLAN:  PT FREQUENCY: 2x/week  PT DURATION: 6 weeks  PLANNED INTERVENTIONS: Therapeutic exercises, Therapeutic activity, Neuromuscular re-education, Balance training, Gait training, Patient/Family education, Self Care, Joint mobilization, Aquatic Therapy, Dry Needling, Spinal mobilization, Cryotherapy, Moist heat, Taping, Traction, Manual therapy, and Re-evaluation.  PLAN  FOR NEXT SESSION:  Lumbar flexibility, strength, manual as needed.   Sigurd Sos, PT 12/15/22 11:58 AM   [facility] [address] [place], [place] [address] Phone # [phone] Fax [phone]

## 2022-12-22 ENCOUNTER — Ambulatory Visit (INDEPENDENT_AMBULATORY_CARE_PROVIDER_SITE_OTHER): Payer: Medicare PPO

## 2022-12-22 DIAGNOSIS — E538 Deficiency of other specified B group vitamins: Secondary | ICD-10-CM | POA: Diagnosis not present

## 2022-12-22 MED ORDER — CYANOCOBALAMIN 1000 MCG/ML IJ SOLN
1000.0000 ug | Freq: Once | INTRAMUSCULAR | Status: AC
  Administered 2022-12-22: 1000 ug via INTRAMUSCULAR

## 2022-12-22 NOTE — Progress Notes (Signed)
Per orders of Laurey Morale, MD, injection of B12 given in  Left  deltoid by Franco Collet. Patient tolerated injection well.  Lab Results  Component Value Date   VITAMINB12 175 (L) 12/08/2022

## 2022-12-22 NOTE — Patient Instructions (Signed)
Health Maintenance Due  Topic Date Due   Diabetic kidney evaluation - Urine ACR  02/20/2015   FOOT EXAM  07/14/2018      Row Labels 11/19/2022    3:59 PM 08/07/2022    9:25 AM 07/09/2022    9:58 AM  Depression screen PHQ 2/9   Section Header. No data exists in this row.     Decreased Interest   0 0 0  Down, Depressed, Hopeless   1 0 0  PHQ - 2 Score   1 0 0  Altered sleeping   0 1   Tired, decreased energy   0 0   Change in appetite   0 0   Feeling bad or failure about yourself    0 0   Trouble concentrating   0 0   Moving slowly or fidgety/restless   0 0   Suicidal thoughts   0 0   PHQ-9 Score   1 1   Difficult doing work/chores   Not difficult at all Not difficult at all

## 2022-12-23 ENCOUNTER — Ambulatory Visit: Payer: Medicare PPO

## 2022-12-23 DIAGNOSIS — M5459 Other low back pain: Secondary | ICD-10-CM

## 2022-12-23 DIAGNOSIS — R252 Cramp and spasm: Secondary | ICD-10-CM | POA: Diagnosis not present

## 2022-12-23 DIAGNOSIS — R293 Abnormal posture: Secondary | ICD-10-CM | POA: Diagnosis not present

## 2022-12-23 NOTE — Therapy (Signed)
OUTPATIENT PHYSICAL THERAPY TREATMENT   Patient Name: Walter Campbell MRN: [id number] DOB:1941-06-10, 82 y.o., male Today's Date: 12/23/2022    END OF SESSION:  PT End of Session - 12/23/22 1134     Visit Number 14    Date for PT Re-Evaluation 01/08/23    Authorization Type 28 visits, 10/05/2022-01/08/23    Authorization - Visit Number 14    Authorization - Number of Visits 28    Progress Note Due on Visit 20    PT Start Time 1100    PT Stop Time 1134    PT Time Calculation (min) 34 min    Activity Tolerance Patient tolerated treatment well    Behavior During Therapy [facility] for tasks assessed/performed                       Past Medical History:  Diagnosis Date   Arthritis    Diabetes mellitus    Hypertension    Palpitations 06/10/2010   Pancreatitis    Pneumonia    renal ca 05/20/2022   Past Surgical History:  Procedure Laterality Date   APPENDECTOMY  1957   COLONOSCOPY  12/19/2018   per Dr. 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   TONSILLECTOMY     Patient Active Problem List   Diagnosis Date Noted   BPH with urinary obstruction 12/03/2022   Right renal mass 05/20/2022   Type 2 diabetes mellitus without complication, without long-term current use of insulin ([place]) 03/10/2021   Piriformis syndrome of right side 04/26/2019   Anxiety state 11/27/2014   Hyperlipidemia 11/26/2014   GERD (gastroesophageal reflux disease) 11/14/2012   Essential hypertension 12/04/2009    PCP: Alysia Penna, MD   REFERRING PROVIDER: Earnie Larsson, MD  REFERRING DIAG:  M43.10 (ICD-10-CM) - Spondylolisthesis, site unspecified   Rationale for Evaluation and Treatment: Rehabilitation  THERAPY DIAG:  Cramp and spasm  Other low back pain  Abnormal posture  ONSET DATE: 4-5 weeks   SUBJECTIVE:  SUBJECTIVE STATEMENT: I am sleeping better.  I went to [facility] over the weekend to see my grand kids.     PERTINENT HISTORY:  DM, HTN, renal cancer (05/2022)  PAIN:  Are you having pain? Yes: NPRS scale: 0-1/10  (aching) Pain location: Multiple joint aches today Pain description: aching  Aggravating factors: lifting, sitting too long Relieving factors: change of position, not lifting   PRECAUTIONS: Other: history of cancer   WEIGHT BEARING RESTRICTIONS: No  FALLS:  Has patient fallen in last 6 months? No  LIVING ENVIRONMENT: Lives with: lives with their spouse Lives in: House/apartment, has chair lift if needed  OCCUPATION: retired   PLOF: Independent and Leisure: golf 2x/wk, walk with wife  PATIENT GOALS: reduce LBP, prevent sciatic nerve pain from returning   NEXT MD VISIT: none   OBJECTIVE:   DIAGNOSTIC FINDINGS:  MRI: 08/16/22 IMPRESSION: 1. Transitional lumbosacral anatomy. 2. Degenerative changes of the lumbar spine, more pronounced at L5-S1 where there is moderate spinal canal stenosis with prominent narrowing of the bilateral subarticular zones and mild right neural foraminal narrowing. 3. Mild spinal canal stenosis with mild narrowing of the bilateral subarticular zones and mild bilateral neural foraminal narrowing at L3-4 and L4-5.  PATIENT SURVEYS:  10/05/22: FOTO 67 (goal is 24) 11/23/22: 53 (goal is 72)    SCREENING FOR RED FLAGS: Bowel or bladder incontinence: No Spinal tumors: No Cauda equina syndrome: No Compression fracture:  No Abdominal aneurysm: No  COGNITION: Overall cognitive status: Within functional limits for tasks assessed     SENSATION: WFL  MUSCLE LENGTH: Limited by 50-60% bilaterally  POSTURE: rounded shoulders, forward head, decreased lumbar lordosis, and flexed trunk   PALPATION: Reduced segmental mobility, tension in bil lumbar musculature and gluteals   LUMBAR ROM:  Lumbar A/ROM limited by 50% with limited segmental mobility in the lumbar spine  LOWER EXTREMITY ROM:    Hip IR limited by 50-60%, hamstring length limited by 50-60%, all other hip flexibility limited by 50%.    LOWER EXTREMITY MMT:    Hips 4/5, knees 4+/5 (limited knee extension due to shortened hamstrings)   LUMBAR SPECIAL TESTS:  Straight leg raise test: Negative  FUNCTIONAL TESTS:  Sit to stand without UE support  GAIT: Distance walked: 50 Assistive device utilized: None Level of assistance: Modified independence Comments: flexed trunk, wide base of support   TODAY'S TREATMENT:    12/23/22:  NuStep Level 4 x 10 minutes- PT monitored for pain SKC stretch 1x 30 sec Bil red loop clamshells 2x10 with PPT Bridge with ball squeeze 5" x20 Sit to stand: 5# kettle bell 2x10 Lower trunk rotation with Pilates breath 4x Bil 2x Hip flexor stretch standing at second step 2x 3 breaths Standing hip abduction & ext 15x Bil- standing on balance pad Manual: addaday to bil Lt low back and gluteals x 10 min  12/15/22:  NuStep Level 4 x 10 minutes- PTA monitored for pain SKC stretch 1x 30 sec Bil Yellow loop clamshells 2x10 with PPT Bridge with ball squeeze 5" x20 Lower trunk rotation with Pilates breath 4x Bil 2x Hip flexor stretch standing at second step 2x 3 breaths Standing hip abduction & ext 15x Bil- standing on balance pad Manual: addaday to bil Lt low back and gluteals x 10 min  12/07/22:  NuStep Level 4 x 10 minutes- PTA monitored for pain SKC stretch 1x 30 sec Bil Yellow loop clamshells 15x with PPT Lower  trunk rotation with Pilates breath 4x Bil 2x Hip flexor stretch  standing at second step 2x 3 breaths Standing hip abduction & ext 15x Bil Manual: addaday to bil LT low back and gluteals x 7 min   PATIENT EDUCATION:  Education details: Access Code: [email or web address] Person educated: Patient Education method: Explanation, Demonstration, and Handouts Education comprehension: verbalized understanding and returned demonstration  HOME EXERCISE PROGRAM: Access Code: [facility] URL: [email or web address] Date: 10/08/2022 Prepared by: Claiborne Billings  Exercises - Seated Hamstring Stretch  - 3 x daily - 7 x weekly - 1 sets - 3 reps - 20 hold - Hooklying Single Knee to Chest Stretch  - 3 x daily - 7 x weekly - 1 sets - 3 reps - 20 hold - Supine Lower Trunk Rotation  - 3 x daily - 7 x weekly - 1 sets - 3 reps - 2 hold - Seated Figure 4 Piriformis Stretch  - 3 x daily - 7 x weekly - 1 sets - 3 reps - 20 hold - Clamshell  - 1 x daily - 7 x weekly - 1-2 sets - 10 reps - Supine Hip Adduction Isometric with Ball  - 1 x daily - 7 x weekly - 1-2 sets - 10 reps - 5 hold  ASSESSMENT:  CLINICAL IMPRESSION:  Pt arrived without significant pain and denies any significant pain over the past week.  He was traveling so hasn't done any Pilates. Pt has been modifying his mechanics for lumbar protection.  He requires minor tactile cues for core engagement with exercise.    Pt responds well to Addaday for tissue mobility.  Patient will benefit from skilled PT to address the below impairments and improve overall function.      OBJECTIVE IMPAIRMENTS: decreased activity tolerance, decreased balance, decreased endurance, difficulty walking, decreased ROM, decreased strength, hypomobility, increased muscle spasms, impaired flexibility, improper body mechanics, postural dysfunction, and pain.   ACTIVITY LIMITATIONS: sitting, standing, transfers, locomotion level, and caring for others  PARTICIPATION LIMITATIONS: meal prep,  cleaning, laundry, shopping, and community activity  PERSONAL FACTORS: Age, Social background, and 1-2 comorbidities: lumbar DDD, cares for wife  are also affecting patient's functional outcome.   REHAB POTENTIAL: Good  CLINICAL DECISION MAKING: Evolving/moderate complexity  EVALUATION COMPLEXITY: Moderate   GOALS: Goals reviewed with patient? Yes  SHORT TERM GOALS: Target date: 11/03/2022   Be independent in initial HEP Baseline: Goal status: Goal me t 10/30/22  2.  Report > or = to 30% reduction in LBP with standing and walking  Baseline:  30-40% (11/23/22) Goal status: Goal met   3.  Initiate return to regular exercise routine including pilates  Baseline: doing stretches consistently (10/15/22) Goal status: in progress    LONG TERM GOALS: Target date: 01/08/23    Be independent in advanced HEP Baseline:  Goal status: In progress   2.  Improve FOTO to > or = to 72 Baseline: 53 (declined today although pt reports 30-40% improvement) Goal status: In progress   3.  Report > or = to 70% reduction in LBP with standing and walking  Baseline: 30-40% (11/23/22) Goal status: On going  4.  Verbalize and demonstrate body mechanics modifications with lifting and carrying for lumbar protection Baseline:  Goal status: Goal met: 10/30/22  5.  Perform exercise routine regularly and verbalize modifications for safe progression Baseline: doing exercises consistently Goal status: in progress    PLAN:  PT FREQUENCY: 2x/week  PT DURATION: 6 weeks  PLANNED INTERVENTIONS: Therapeutic exercises, Therapeutic activity, Neuromuscular re-education, Balance training, Gait training, Patient/Family education, Self Care, Joint mobilization, Aquatic  Therapy, Dry Needling, Spinal mobilization, Cryotherapy, Moist heat, Taping, Traction, Manual therapy, and Re-evaluation.  PLAN FOR NEXT SESSION:  Lumbar flexibility, strength, manual as needed.   Sigurd Sos, PT 12/23/22 11:44 AM    [facility] [address] [place], [place] [address] Phone # [phone] Fax [phone]

## 2022-12-24 NOTE — Therapy (Signed)
OUTPATIENT PHYSICAL THERAPY TREATMENT   Patient Name: Walter Campbell MRN: [id number] DOB:Nov 11, 1940, 82 y.o., male Today's Date: 12/25/2022    END OF SESSION:  PT End of Session - 12/25/22 0845     Visit Number 15    Date for PT Re-Evaluation 01/08/23    Authorization Type 28 visits, 10/05/2022-01/08/23    Authorization - Visit Number 15    Authorization - Number of Visits 28    Progress Note Due on Visit 20    PT Start Time 0845    PT Stop Time 0923    PT Time Calculation (min) 38 min    Activity Tolerance Patient tolerated treatment well    Behavior During Therapy [facility] for tasks assessed/performed                        Past Medical History:  Diagnosis Date   Arthritis    Diabetes mellitus    Hypertension    Palpitations 06/10/2010   Pancreatitis    Pneumonia    renal ca 05/20/2022   Past Surgical History:  Procedure Laterality Date   APPENDECTOMY  1957   COLONOSCOPY  12/19/2018   per Dr. 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   TONSILLECTOMY     Patient Active Problem List   Diagnosis Date Noted   BPH with urinary obstruction 12/03/2022   Right renal mass 05/20/2022   Type 2 diabetes mellitus without complication, without long-term current use of insulin ([place]) 03/10/2021   Piriformis syndrome of right side 04/26/2019   Anxiety state 11/27/2014   Hyperlipidemia 11/26/2014   GERD (gastroesophageal reflux disease) 11/14/2012   Essential hypertension 12/04/2009    PCP: Alysia Penna, MD   REFERRING PROVIDER: Earnie Larsson, MD  REFERRING DIAG:  M43.10 (ICD-10-CM) - Spondylolisthesis, site unspecified   Rationale for Evaluation and Treatment: Rehabilitation  THERAPY DIAG:  Cramp and spasm  Other low back pain  Abnormal posture  ONSET DATE: 4-5 weeks   SUBJECTIVE:  SUBJECTIVE STATEMENT: I got on my Reformer 2x this week.   PERTINENT HISTORY:  DM, HTN, renal cancer (05/2022)  PAIN:  Are you having pain? No: NPRS scale: 0/10  (aching) Pain location: Multiple joint aches today Pain description: aching  Aggravating factors: lifting, sitting too long Relieving factors: change of position, not lifting   PRECAUTIONS: Other: history of cancer   WEIGHT BEARING RESTRICTIONS: No  FALLS:  Has patient fallen in last 6 months? No  LIVING ENVIRONMENT: Lives with: lives with their spouse Lives in: House/apartment, has chair lift if needed  OCCUPATION: retired   PLOF: Independent and Leisure: golf 2x/wk, walk with wife  PATIENT GOALS: reduce LBP, prevent sciatic nerve pain from returning   NEXT MD VISIT: none   OBJECTIVE:   DIAGNOSTIC FINDINGS:  MRI: 08/16/22 IMPRESSION: 1. Transitional lumbosacral anatomy. 2. Degenerative changes of the lumbar spine, more pronounced at L5-S1 where there is moderate spinal canal stenosis with prominent narrowing of the bilateral subarticular zones and mild right neural foraminal narrowing. 3. Mild spinal canal stenosis with mild narrowing of the bilateral subarticular zones and mild bilateral neural foraminal narrowing at L3-4 and L4-5.  PATIENT SURVEYS:  10/05/22: FOTO 67 (goal is 52) 11/23/22: 53 (goal is 72)    SCREENING FOR RED FLAGS: Bowel or bladder incontinence: No Spinal tumors: No Cauda equina syndrome: No Compression fracture: No Abdominal aneurysm: No  COGNITION: Overall cognitive  status: Within functional limits for tasks assessed     SENSATION: WFL  MUSCLE LENGTH: Limited by 50-60% bilaterally  POSTURE: rounded shoulders, forward head, decreased lumbar lordosis, and flexed trunk   PALPATION: Reduced segmental mobility, tension in bil lumbar musculature and gluteals   LUMBAR ROM:  Lumbar A/ROM limited by 50% with limited segmental mobility in the lumbar spine  LOWER EXTREMITY ROM:    Hip IR limited by 50-60%, hamstring length limited by 50-60%, all other hip flexibility limited by 50%.    LOWER EXTREMITY MMT:    Hips 4/5, knees 4+/5 (limited knee extension due to shortened hamstrings)   LUMBAR SPECIAL TESTS:  Straight leg raise test: Negative  FUNCTIONAL TESTS:  Sit to stand without UE support  GAIT: Distance walked: 50 Assistive device utilized: None Level of assistance: Modified independence Comments: flexed trunk, wide base of support   TODAY'S TREATMENT:     12/25/22:  NuStep Level 4 x 10 minutes- PTA monitored for pain Bil hip flexor stretch at second step 10x Standing hip abduction & ext 15x Bil- standing on balance pad Sit to stand: 5# kettle bell 2x12 Bridge with ball squeeze 5" x20 red loop clamshells 2x15 with PPT Sidelyig open book stretch 10x Bil Manual: addaday to bil Lt low back and gluteals x 10 min   12/23/22:  NuStep Level 4 x 10 minutes- PT monitored for pain SKC stretch 1x 30 sec Bil red loop clamshells 2x10 with PPT Bridge with ball squeeze 5" x20 Sit to stand: 5# kettle bell 2x10 Lower trunk rotation with Pilates breath 4x Bil 2x Hip flexor stretch standing at second step 2x 3 breaths Standing hip abduction & ext 15x Bil- standing on balance pad Manual: addaday to bil Lt low back and gluteals x 10 min  12/15/22:  NuStep Level 4 x 10 minutes- PTA monitored for pain SKC stretch 1x 30 sec Bil Yellow loop clamshells 2x10 with PPT Bridge with ball squeeze 5" x20 Lower trunk rotation with Pilates breath 4x Bil 2x Hip  flexor stretch standing at second step 2x 3 breaths  Standing hip abduction & ext 15x Bil- standing on balance pad Manual: addaday to bil Lt low back and gluteals x 10 min   PATIENT EDUCATION:  Education details: Access Code: [email or web address] Person educated: Patient Education method: Explanation, Demonstration, and Handouts Education comprehension: verbalized understanding and returned demonstration  HOME EXERCISE PROGRAM: Access Code: [facility] URL: [email or web address] Date: 10/08/2022 Prepared by: Claiborne Billings  Exercises - Seated Hamstring Stretch  - 3 x daily - 7 x weekly - 1 sets - 3 reps - 20 hold - Hooklying Single Knee to Chest Stretch  - 3 x daily - 7 x weekly - 1 sets - 3 reps - 20 hold - Supine Lower Trunk Rotation  - 3 x daily - 7 x weekly - 1 sets - 3 reps - 2 hold - Seated Figure 4 Piriformis Stretch  - 3 x daily - 7 x weekly - 1 sets - 3 reps - 20 hold - Clamshell  - 1 x daily - 7 x weekly - 1-2 sets - 10 reps - Supine Hip Adduction Isometric with Ball  - 1 x daily - 7 x weekly - 1-2 sets - 10 reps - 5 hold  ASSESSMENT:  CLINICAL IMPRESSION: Pt reports he is doing very well, arrives with no pain. He thinks he feels "looser" as he releases his emotions post the passing of his wife.      OBJECTIVE IMPAIRMENTS: decreased activity tolerance, decreased balance, decreased endurance, difficulty walking, decreased ROM, decreased strength, hypomobility, increased muscle spasms, impaired flexibility, improper body mechanics, postural dysfunction, and pain.   ACTIVITY LIMITATIONS: sitting, standing, transfers, locomotion level, and caring for others  PARTICIPATION LIMITATIONS: meal prep, cleaning, laundry, shopping, and community activity  PERSONAL FACTORS: Age, Social background, and 1-2 comorbidities: lumbar DDD, cares for wife  are also affecting patient's functional outcome.   REHAB POTENTIAL: Good  CLINICAL DECISION MAKING: Evolving/moderate complexity  EVALUATION  COMPLEXITY: Moderate   GOALS: Goals reviewed with patient? Yes  SHORT TERM GOALS: Target date: 11/03/2022   Be independent in initial HEP Baseline: Goal status: Goal me t 10/30/22  2.  Report > or = to 30% reduction in LBP with standing and walking  Baseline:  30-40% (11/23/22) Goal status: Goal met   3.  Initiate return to regular exercise routine including pilates  Baseline: doing stretches consistently (10/15/22) Goal status: in progress    LONG TERM GOALS: Target date: 01/08/23    Be independent in advanced HEP Baseline:  Goal status: In progress   2.  Improve FOTO to > or = to 72 Baseline: 53 (declined today although pt reports 30-40% improvement) Goal status: In progress   3.  Report > or = to 70% reduction in LBP with standing and walking  Baseline: 30-40% (11/23/22) Goal status: On going  4.  Verbalize and demonstrate body mechanics modifications with lifting and carrying for lumbar protection Baseline:  Goal status: Goal met: 10/30/22  5.  Perform exercise routine regularly and verbalize modifications for safe progression Baseline: doing exercises consistently Goal status: in progress    PLAN:  PT FREQUENCY: 2x/week  PT DURATION: 6 weeks  PLANNED INTERVENTIONS: Therapeutic exercises, Therapeutic activity, Neuromuscular re-education, Balance training, Gait training, Patient/Family education, Self Care, Joint mobilization, Aquatic Therapy, Dry Needling, Spinal mobilization, Cryotherapy, Moist heat, Taping, Traction, Manual therapy, and Re-evaluation.  PLAN FOR NEXT SESSION:  Lumbar flexibility, strength, manual as needed.   Myrene Galas, PTA 12/25/22 9:24 AM   [facility] [address] [place], [place]  [place] Phone # [phone] Fax [phone]

## 2022-12-25 ENCOUNTER — Ambulatory Visit: Payer: Medicare PPO | Admitting: Physical Therapy

## 2022-12-25 ENCOUNTER — Encounter: Payer: Self-pay | Admitting: Physical Therapy

## 2022-12-25 DIAGNOSIS — L300 Nummular dermatitis: Secondary | ICD-10-CM | POA: Diagnosis not present

## 2022-12-25 DIAGNOSIS — L578 Other skin changes due to chronic exposure to nonionizing radiation: Secondary | ICD-10-CM | POA: Diagnosis not present

## 2022-12-25 DIAGNOSIS — R252 Cramp and spasm: Secondary | ICD-10-CM | POA: Diagnosis not present

## 2022-12-25 DIAGNOSIS — M5459 Other low back pain: Secondary | ICD-10-CM | POA: Diagnosis not present

## 2022-12-25 DIAGNOSIS — R293 Abnormal posture: Secondary | ICD-10-CM

## 2022-12-25 DIAGNOSIS — L570 Actinic keratosis: Secondary | ICD-10-CM | POA: Diagnosis not present

## 2022-12-30 ENCOUNTER — Ambulatory Visit: Payer: Medicare PPO

## 2022-12-30 ENCOUNTER — Ambulatory Visit (INDEPENDENT_AMBULATORY_CARE_PROVIDER_SITE_OTHER): Payer: Medicare PPO | Admitting: *Deleted

## 2022-12-30 DIAGNOSIS — R252 Cramp and spasm: Secondary | ICD-10-CM

## 2022-12-30 DIAGNOSIS — M5459 Other low back pain: Secondary | ICD-10-CM

## 2022-12-30 DIAGNOSIS — R293 Abnormal posture: Secondary | ICD-10-CM

## 2022-12-30 DIAGNOSIS — E538 Deficiency of other specified B group vitamins: Secondary | ICD-10-CM

## 2022-12-30 MED ORDER — CYANOCOBALAMIN 1000 MCG/ML IJ SOLN
1000.0000 ug | Freq: Once | INTRAMUSCULAR | Status: AC
  Administered 2022-12-30: 1000 ug via INTRAMUSCULAR

## 2022-12-30 NOTE — Therapy (Signed)
OUTPATIENT PHYSICAL THERAPY TREATMENT   Patient Name: Walter Campbell MRN: [id number] DOB:08-Sep-1941, 82 y.o., male Today's Date: 12/30/2022    END OF SESSION:  PT End of Session - 12/30/22 1313     Visit Number 16    Date for PT Re-Evaluation 01/08/23    Authorization Type 28 visits, 10/05/2022-01/08/23    Authorization - Visit Number 16    Authorization - Number of Visits 28    Progress Note Due on Visit 20    PT Start Time 1232    PT Stop Time 1311    PT Time Calculation (min) 39 min    Activity Tolerance Patient tolerated treatment well    Behavior During Therapy [facility] for tasks assessed/performed                         Past Medical History:  Diagnosis Date   Arthritis    Diabetes mellitus    Hypertension    Palpitations 06/10/2010   Pancreatitis    Pneumonia    renal ca 05/20/2022   Past Surgical History:  Procedure Laterality Date   APPENDECTOMY  1957   COLONOSCOPY  12/19/2018   per Dr. 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   TONSILLECTOMY     Patient Active Problem List   Diagnosis Date Noted   BPH with urinary obstruction 12/03/2022   Right renal mass 05/20/2022   Type 2 diabetes mellitus without complication, without long-term current use of insulin ([place]) 03/10/2021   Piriformis syndrome of right side 04/26/2019   Anxiety state 11/27/2014   Hyperlipidemia 11/26/2014   GERD (gastroesophageal reflux disease) 11/14/2012   Essential hypertension 12/04/2009    PCP: Alysia Penna, MD   REFERRING PROVIDER: Earnie Larsson, MD  REFERRING DIAG:  M43.10 (ICD-10-CM) - Spondylolisthesis, site unspecified   Rationale for Evaluation and Treatment: Rehabilitation  THERAPY DIAG:  Cramp and spasm  Other low back pain  Abnormal posture  ONSET DATE: 4-5 weeks   SUBJECTIVE:  SUBJECTIVE STATEMENT:  I walked a mile on Saturday. I booked a trip so I want to get in shape.  I've been doing pilates and my leg stretches.  My back is much better.  80% overall improvement since the start of care.    PERTINENT HISTORY:  DM, HTN, renal cancer (05/2022)  PAIN:  Are you having pain? No: NPRS scale: 0/10  (aching) Pain location: Multiple joint aches today Pain description: aching  Aggravating factors: lifting, sitting too long Relieving factors: change of position, not lifting   PRECAUTIONS: Other: history of cancer   WEIGHT BEARING RESTRICTIONS: No  FALLS:  Has patient fallen in last 6 months? No  LIVING ENVIRONMENT: Lives with: lives with their spouse Lives in: House/apartment, has chair lift if needed  OCCUPATION: retired   PLOF: Independent and Leisure: golf 2x/wk, walk with wife  PATIENT GOALS: reduce LBP, prevent sciatic nerve pain from returning   NEXT MD VISIT: none   OBJECTIVE:   DIAGNOSTIC FINDINGS:  MRI: 08/16/22 IMPRESSION: 1. Transitional lumbosacral anatomy. 2. Degenerative changes of the lumbar spine, more pronounced at L5-S1 where there is moderate spinal canal stenosis with prominent narrowing of the bilateral subarticular zones and mild right neural foraminal narrowing. 3. Mild spinal canal stenosis with mild narrowing of the bilateral subarticular zones and mild bilateral neural foraminal narrowing at L3-4 and L4-5.  PATIENT SURVEYS:  10/05/22: FOTO 67 (goal is 43) 11/23/22: 53 (goal is 72)    SCREENING FOR RED  FLAGS: Bowel or bladder incontinence: No Spinal tumors: No Cauda equina syndrome: No Compression fracture: No Abdominal aneurysm: No  COGNITION: Overall cognitive status: Within functional limits for tasks assessed     SENSATION: WFL  MUSCLE LENGTH: Limited by 50-60% bilaterally  POSTURE: rounded shoulders, forward head, decreased lumbar lordosis, and flexed trunk   PALPATION: Reduced segmental mobility, tension in bil lumbar musculature and gluteals   LUMBAR ROM:  Lumbar A/ROM limited by 50% with limited segmental mobility in the lumbar spine  LOWER EXTREMITY ROM:    Hip IR limited by 50-60%, hamstring length limited by 50-60%, all other hip flexibility limited by 50%.    LOWER EXTREMITY MMT:    Hips 4/5, knees 4+/5 (limited knee extension due to shortened hamstrings)   LUMBAR SPECIAL TESTS:  Straight leg raise test: Negative  FUNCTIONAL TESTS:  Sit to stand without UE support  GAIT: Distance walked: 50 Assistive device utilized: None Level of assistance: Modified independence Comments: flexed trunk, wide base of support   TODAY'S TREATMENT:    12/30/22:  NuStep Level 4 x 10 minutes- PTA monitored for pain Bil hip flexor stretch at second step 10x Standing hip abduction & ext 15x Bil- standing on balance pad Sit to stand: 5# kettle bell 2x10 Bridge with ball squeeze 5" x20 red loop clamshells 2x15 with PPT Manual: addaday to bil Lt low back and gluteals x 10 min  12/25/22:  NuStep Level 4 x 10 minutes- PTA monitored for pain Bil hip flexor stretch at second step 10x Standing hip abduction & ext 15x Bil- standing on balance pad Sit to stand: 5# kettle bell 2x12 Bridge with ball squeeze 5" x20 red loop clamshells 2x15 with PPT Sidelyig open book stretch 10x Bil Manual: addaday to bil Lt low back and gluteals x 10 min   12/23/22:  NuStep Level 4 x 10 minutes- PT monitored for pain SKC stretch 1x 30 sec Bil red loop clamshells 2x10 with PPT Bridge with  ball squeeze 5" x20 Sit to  stand: 5# kettle bell 2x10 Lower trunk rotation with Pilates breath 4x Bil 2x Hip flexor stretch standing at second step 2x 3 breaths Standing hip abduction & ext 15x Bil- standing on balance pad Manual: addaday to bil Lt low back and gluteals x 10 min  PATIENT EDUCATION:  Education details: Access Code: [email or web address] Person educated: Patient Education method: Explanation, Demonstration, and Handouts Education comprehension: verbalized understanding and returned demonstration  HOME EXERCISE PROGRAM: Access Code: [facility] URL: [email or web address] Date: 10/08/2022 Prepared by: Claiborne Billings  Exercises - Seated Hamstring Stretch  - 3 x daily - 7 x weekly - 1 sets - 3 reps - 20 hold - Hooklying Single Knee to Chest Stretch  - 3 x daily - 7 x weekly - 1 sets - 3 reps - 20 hold - Supine Lower Trunk Rotation  - 3 x daily - 7 x weekly - 1 sets - 3 reps - 2 hold - Seated Figure 4 Piriformis Stretch  - 3 x daily - 7 x weekly - 1 sets - 3 reps - 20 hold - Clamshell  - 1 x daily - 7 x weekly - 1-2 sets - 10 reps - Supine Hip Adduction Isometric with Ball  - 1 x daily - 7 x weekly - 1-2 sets - 10 reps - 5 hold  ASSESSMENT:  CLINICAL IMPRESSION:  Pt reports 80% overall improvement in symptoms since the start of care.  Pt is using his reformer at home and stretching his legs.  Pt is walking to help build endurance for an upcoming trip.  Pt with good control of his core in supine.  PT monitored for pain, technique and fatigue.  Patient will benefit from skilled PT to address the below impairments and improve overall function.     OBJECTIVE IMPAIRMENTS: decreased activity tolerance, decreased balance, decreased endurance, difficulty walking, decreased ROM, decreased strength, hypomobility, increased muscle spasms, impaired flexibility, improper body mechanics, postural dysfunction, and pain.   ACTIVITY LIMITATIONS: sitting, standing, transfers, locomotion level, and  caring for others  PARTICIPATION LIMITATIONS: meal prep, cleaning, laundry, shopping, and community activity  PERSONAL FACTORS: Age, Social background, and 1-2 comorbidities: lumbar DDD, cares for wife  are also affecting patient's functional outcome.   REHAB POTENTIAL: Good  CLINICAL DECISION MAKING: Evolving/moderate complexity  EVALUATION COMPLEXITY: Moderate   GOALS: Goals reviewed with patient? Yes  SHORT TERM GOALS: Target date: 11/03/2022   Be independent in initial HEP Baseline: Goal status: Goal me t 10/30/22  2.  Report > or = to 30% reduction in LBP with standing and walking  Baseline:  30-40% (11/23/22) Goal status: Goal met   3.  Initiate return to regular exercise routine including pilates  Baseline: doing stretches consistently (10/15/22) Goal status: in progress    LONG TERM GOALS: Target date: 01/08/23    Be independent in advanced HEP Baseline:  Goal status: In progress   2.  Improve FOTO to > or = to 72 Baseline: 53 (declined today although pt reports 30-40% improvement) Goal status: In progress   3.  Report > or = to 70% reduction in LBP with standing and walking  Baseline: 80% (12/30/22) Goal status: On going  4.  Verbalize and demonstrate body mechanics modifications with lifting and carrying for lumbar protection Baseline:  Goal status: Goal met: 10/30/22  5.  Perform exercise routine regularly and verbalize modifications for safe progression Baseline: doing exercises consistently Goal status: in progress    PLAN:  PT FREQUENCY: 2x/week  PT DURATION: 6 weeks  PLANNED INTERVENTIONS: Therapeutic exercises, Therapeutic activity, Neuromuscular re-education, Balance training, Gait training, Patient/Family education, Self Care, Joint mobilization, Aquatic Therapy, Dry Needling, Spinal mobilization, Cryotherapy, Moist heat, Taping, Traction, Manual therapy, and Re-evaluation.  PLAN FOR NEXT SESSION:  Lumbar flexibility, strength, manual as  needed. POC ends 01/08/23, probable D/C   Sigurd Sos, PT 12/30/22 1:14 PM   [facility] [address] [place], [place] [address] Phone # [phone] Fax [phone]

## 2022-12-30 NOTE — Progress Notes (Signed)
cy

## 2022-12-30 NOTE — Progress Notes (Signed)
Per orders of Cory Nafziger, NP, injection of Cyanocobalamin 1000mcg given by Case Vassell A. Patient tolerated injection well. 

## 2023-01-04 ENCOUNTER — Ambulatory Visit: Payer: Medicare PPO

## 2023-01-04 DIAGNOSIS — R252 Cramp and spasm: Secondary | ICD-10-CM

## 2023-01-04 DIAGNOSIS — M5459 Other low back pain: Secondary | ICD-10-CM | POA: Diagnosis not present

## 2023-01-04 DIAGNOSIS — R293 Abnormal posture: Secondary | ICD-10-CM | POA: Diagnosis not present

## 2023-01-04 NOTE — Therapy (Signed)
OUTPATIENT PHYSICAL THERAPY TREATMENT   Patient Name: Walter Campbell MRN: [id number] DOB:[birth year], 82 y.o., male Today's Date: 01/04/2023    END OF SESSION:  PT End of Session - 01/04/23 0839     Visit Number 17    Date for PT Re-Evaluation 01/08/23    Authorization Type 28 visits, 10/05/2022-01/08/23    Authorization - Visit Number 54    Authorization - Number of Visits 28    Progress Note Due on Visit 20    PT Start Time 0800    PT Stop Time 0839    PT Time Calculation (min) 39 min    Activity Tolerance Patient tolerated treatment well    Behavior During Therapy [facility] for tasks assessed/performed                          Past Medical History:  Diagnosis Date   Arthritis    Diabetes mellitus    Hypertension    Palpitations 06/10/2010   Pancreatitis    Pneumonia    renal ca 05/20/2022   Past Surgical History:  Procedure Laterality Date   APPENDECTOMY  1957   COLONOSCOPY  12/19/2018   per Dr. 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   TONSILLECTOMY     Patient Active Problem List   Diagnosis Date Noted   BPH with urinary obstruction 12/03/2022   Right renal mass 05/20/2022   Type 2 diabetes mellitus without complication, without long-term current use of insulin ([place]) 03/10/2021   Piriformis syndrome of right side 04/26/2019   Anxiety state 11/27/2014   Hyperlipidemia 11/26/2014   GERD (gastroesophageal reflux disease) 11/14/2012   Essential hypertension 12/04/2009    PCP: Alysia Penna, MD   REFERRING PROVIDER: Earnie Larsson, MD  REFERRING DIAG:  M43.10 (ICD-10-CM) - Spondylolisthesis, site unspecified   Rationale for Evaluation and Treatment: Rehabilitation  THERAPY DIAG:  Cramp and spasm  Other low back pain  Abnormal posture  ONSET DATE: 4-5 weeks   SUBJECTIVE:  SUBJECTIVE STATEMENT: I am still doing ok.    PERTINENT HISTORY:  DM, HTN, renal cancer (05/2022)  PAIN:  Are you having pain? No: NPRS scale: 0/10  (aching) Pain location: Multiple joint aches today Pain description: aching  Aggravating factors: lifting, sitting too long Relieving factors: change of position, not lifting   PRECAUTIONS: Other: history of cancer   WEIGHT BEARING RESTRICTIONS: No  FALLS:  Has patient fallen in last 6 months? No  LIVING ENVIRONMENT: Lives with: lives with their spouse Lives in: House/apartment, has chair lift if needed  OCCUPATION: retired   PLOF: Independent and Leisure: golf 2x/wk, walk with wife  PATIENT GOALS: reduce LBP, prevent sciatic nerve pain from returning   NEXT MD VISIT: none   OBJECTIVE:   DIAGNOSTIC FINDINGS:  MRI: 08/16/22 IMPRESSION: 1. Transitional lumbosacral anatomy. 2. Degenerative changes of the lumbar spine, more pronounced at L5-S1 where there is moderate spinal canal stenosis with prominent narrowing of the bilateral subarticular zones and mild right neural foraminal narrowing. 3. Mild spinal canal stenosis with mild narrowing of the bilateral subarticular zones and mild bilateral neural foraminal narrowing at L3-4 and L4-5.  PATIENT SURVEYS:  10/05/22: FOTO 67 (goal is 65) 11/23/22: 53 (goal is 72)    SCREENING FOR RED FLAGS: Bowel or bladder incontinence: No Spinal tumors: No Cauda equina syndrome: No Compression fracture: No Abdominal aneurysm: No  COGNITION: Overall cognitive status:  Within functional limits for tasks assessed     SENSATION: WFL  MUSCLE LENGTH: Limited by 50-60% bilaterally  POSTURE: rounded shoulders, forward head, decreased lumbar lordosis, and flexed trunk   PALPATION: Reduced segmental mobility, tension in bil lumbar musculature and gluteals   LUMBAR ROM:  Lumbar A/ROM limited by 50% with limited segmental mobility in the lumbar spine  LOWER EXTREMITY ROM:    Hip IR limited by 50-60%, hamstring length limited by 50-60%, all other hip flexibility limited by 50%.    LOWER EXTREMITY MMT:    Hips 4/5, knees 4+/5 (limited knee extension due to shortened hamstrings)   LUMBAR SPECIAL TESTS:  Straight leg raise test: Negative  FUNCTIONAL TESTS:  Sit to stand without UE support  GAIT: Distance walked: 50 Assistive device utilized: None Level of assistance: Modified independence Comments: flexed trunk, wide base of support   TODAY'S TREATMENT:    01/04/23:  NuStep Level 4 x 8 minutes- PT monitored for pain Bil hip flexor stretch at second step 10x Standing hip abduction & ext 15x Bil- standing on balance pad Sit to stand: 5# kettle bell 2x10 Bridge with ball squeeze 5" x20 red loop clamshells 2x15 with PPT Manual: addaday to bil Lt low back and gluteals x 10 min  12/30/22:  NuStep Level 4 x 10 minutes- PTA monitored for pain Bil hip flexor stretch at second step 10x Standing hip abduction & ext 15x Bil- standing on balance pad Sit to stand: 5# kettle bell 2x10 Bridge with ball squeeze 5" x20 red loop clamshells 2x15 with PPT Manual: addaday to bil Lt low back and gluteals x 10 min  12/25/22:  NuStep Level 4 x 10 minutes- PTA monitored for pain Bil hip flexor stretch at second step 10x Standing hip abduction & ext 15x Bil- standing on balance pad Sit to stand: 5# kettle bell 2x12 Bridge with ball squeeze 5" x20 red loop clamshells 2x15 with PPT Sidelyig open book stretch 10x Bil Manual: addaday to bil Lt low back and gluteals x  10 min   PATIENT EDUCATION:  Education details: Access  Code: [email or web address] Person educated: Patient Education method: Explanation, Demonstration, and Handouts Education comprehension: verbalized understanding and returned demonstration  HOME EXERCISE PROGRAM: Access Code: [facility] URL: [email or web address] Date: 10/08/2022 Prepared by: Claiborne Billings  Exercises - Seated Hamstring Stretch  - 3 x daily - 7 x weekly - 1 sets - 3 reps - 20 hold - Hooklying Single Knee to Chest Stretch  - 3 x daily - 7 x weekly - 1 sets - 3 reps - 20 hold - Supine Lower Trunk Rotation  - 3 x daily - 7 x weekly - 1 sets - 3 reps - 2 hold - Seated Figure 4 Piriformis Stretch  - 3 x daily - 7 x weekly - 1 sets - 3 reps - 20 hold - Clamshell  - 1 x daily - 7 x weekly - 1-2 sets - 10 reps - Supine Hip Adduction Isometric with Ball  - 1 x daily - 7 x weekly - 1-2 sets - 10 reps - 5 hold  ASSESSMENT:  CLINICAL IMPRESSION:  Pt continues to report 80% overall improvement in symptoms since the start of care.  Pt is using his reformer at home and stretching his legs.  Pt is working on building his endurance for walking to prepare for an upcoming trip.  Pt with improved control of his core.  PT monitored for pain, technique and fatigue.  Patient will benefit from skilled PT to address the below impairments and improve overall function.     OBJECTIVE IMPAIRMENTS: decreased activity tolerance, decreased balance, decreased endurance, difficulty walking, decreased ROM, decreased strength, hypomobility, increased muscle spasms, impaired flexibility, improper body mechanics, postural dysfunction, and pain.   ACTIVITY LIMITATIONS: sitting, standing, transfers, locomotion level, and caring for others  PARTICIPATION LIMITATIONS: meal prep, cleaning, laundry, shopping, and community activity  PERSONAL FACTORS: Age, Social background, and 1-2 comorbidities: lumbar DDD, cares for wife  are also affecting patient's functional  outcome.   REHAB POTENTIAL: Good  CLINICAL DECISION MAKING: Evolving/moderate complexity  EVALUATION COMPLEXITY: Moderate   GOALS: Goals reviewed with patient? Yes  SHORT TERM GOALS: Target date: 11/03/2022   Be independent in initial HEP Baseline: Goal status: Goal me t 10/30/22  2.  Report > or = to 30% reduction in LBP with standing and walking  Baseline:  30-40% (11/23/22) Goal status: Goal met   3.  Initiate return to regular exercise routine including pilates  Baseline: doing stretches consistently (10/15/22) Goal status: in progress    LONG TERM GOALS: Target date: 01/08/23    Be independent in advanced HEP Baseline:  Goal status: In progress   2.  Improve FOTO to > or = to 72 Baseline: 53 (declined today although pt reports 30-40% improvement) Goal status: In progress   3.  Report > or = to 70% reduction in LBP with standing and walking  Baseline: 80% (12/30/22) Goal status: On going  4.  Verbalize and demonstrate body mechanics modifications with lifting and carrying for lumbar protection Baseline:  Goal status: Goal met: 10/30/22  5.  Perform exercise routine regularly and verbalize modifications for safe progression Baseline: doing exercises consistently Goal status: in progress    PLAN:  PT FREQUENCY: 2x/week  PT DURATION: 6 weeks  PLANNED INTERVENTIONS: Therapeutic exercises, Therapeutic activity, Neuromuscular re-education, Balance training, Gait training, Patient/Family education, Self Care, Joint mobilization, Aquatic Therapy, Dry Needling, Spinal mobilization, Cryotherapy, Moist heat, Taping, Traction, Manual therapy, and Re-evaluation.  PLAN FOR NEXT SESSION:  FOTO next, D/C to HEP   Sigurd Sos, PT 01/04/23  8:39 AM   [facility] [address], [place], [place] [address] Phone # [phone] Fax [phone]

## 2023-01-06 ENCOUNTER — Ambulatory Visit (INDEPENDENT_AMBULATORY_CARE_PROVIDER_SITE_OTHER): Payer: Medicare PPO

## 2023-01-06 ENCOUNTER — Ambulatory Visit: Payer: Medicare PPO

## 2023-01-06 DIAGNOSIS — M5459 Other low back pain: Secondary | ICD-10-CM

## 2023-01-06 DIAGNOSIS — R293 Abnormal posture: Secondary | ICD-10-CM | POA: Diagnosis not present

## 2023-01-06 DIAGNOSIS — R252 Cramp and spasm: Secondary | ICD-10-CM

## 2023-01-06 DIAGNOSIS — E538 Deficiency of other specified B group vitamins: Secondary | ICD-10-CM | POA: Diagnosis not present

## 2023-01-06 MED ORDER — CYANOCOBALAMIN 1000 MCG/ML IJ SOLN
1000.0000 ug | Freq: Once | INTRAMUSCULAR | Status: AC
  Administered 2023-01-06: 1000 ug via INTRAMUSCULAR

## 2023-01-06 NOTE — Progress Notes (Signed)
Per orders of Dr. Sarajane Jews, injection of Cyanocobalamin inj. 1078mg given by KEncarnacion Slateson Left deltoid Patient tolerated injection well.

## 2023-01-06 NOTE — Therapy (Signed)
OUTPATIENT PHYSICAL THERAPY TREATMENT   Patient Name: Walter Campbell MRN: [id number] DOB:Apr 19, 1941, 82 y.o., male Today's Date: 01/06/2023    END OF SESSION:  PT End of Session - 01/06/23 1107     Visit Number 18    Authorization - Visit Number 18    Authorization - Number of Visits 28    PT Start Time [id number]    PT Stop Time 1141    PT Time Calculation (min) 39 min    Activity Tolerance Patient tolerated treatment well    Behavior During Therapy [facility] for tasks assessed/performed                           Past Medical History:  Diagnosis Date   Arthritis    Diabetes mellitus    Hypertension    Palpitations 06/10/2010   Pancreatitis    Pneumonia    renal ca 05/20/2022   Past Surgical History:  Procedure Laterality Date   APPENDECTOMY  1957   COLONOSCOPY  12/19/2018   per Dr. 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   TONSILLECTOMY     Patient Active Problem List   Diagnosis Date Noted   BPH with urinary obstruction 12/03/2022   Right renal mass 05/20/2022   Type 2 diabetes mellitus without complication, without long-term current use of insulin ([place]) 03/10/2021   Piriformis syndrome of right side 04/26/2019   Anxiety state 11/27/2014   Hyperlipidemia 11/26/2014   GERD (gastroesophageal reflux disease) 11/14/2012   Essential hypertension 12/04/2009    PCP: Alysia Penna, MD   REFERRING PROVIDER: Earnie Larsson, MD  REFERRING DIAG: M43.10 (ICD-10-CM) - Spondylolisthesis, site unspecified   Rationale for Evaluation and Treatment:  Rehabilitation  THERAPY DIAG:  Cramp and spasm  Other low back pain  Abnormal posture  ONSET DATE: 4-5 weeks   SUBJECTIVE:                                                                                                                                                                                           SUBJECTIVE STATEMENT:I have a lot of travel coming up.    PERTINENT HISTORY:  DM, HTN, renal cancer (05/2022)  PAIN:  Are you having pain? No: NPRS scale: 0/10  (aching) Pain location: Multiple joint aches today Pain description: aching  Aggravating factors: lifting, sitting too long Relieving factors: change of position, not lifting   PRECAUTIONS: Other: history of cancer   WEIGHT BEARING RESTRICTIONS: No  FALLS:  Has patient fallen in last 6 months? No  LIVING ENVIRONMENT: Lives with: lives with their spouse Lives in: House/apartment, has chair lift if needed  OCCUPATION: retired   PLOF: Independent and Leisure: golf 2x/wk, walk with wife  PATIENT GOALS: reduce LBP, prevent sciatic nerve pain from returning   NEXT MD VISIT: none   OBJECTIVE:   DIAGNOSTIC FINDINGS:  MRI: 08/16/22 IMPRESSION: 1. Transitional lumbosacral anatomy. 2. Degenerative changes of the lumbar spine, more pronounced at L5-S1 where there is moderate spinal canal stenosis with prominent narrowing of the bilateral subarticular zones and mild right neural foraminal narrowing. 3. Mild spinal canal stenosis with mild narrowing of the bilateral subarticular zones and mild bilateral neural foraminal narrowing at L3-4 and L4-5.  PATIENT SURVEYS:  10/05/22: FOTO 67 (goal is 51) 11/23/22: 53 (goal is 72) 01/06/23: 63     SCREENING FOR RED FLAGS: Bowel or bladder incontinence: No Spinal tumors: No Cauda equina syndrome: No Compression fracture: No Abdominal aneurysm: No  COGNITION: Overall cognitive status: Within functional limits for tasks assessed     SENSATION: WFL  MUSCLE  LENGTH: Limited by 50-60% bilaterally  POSTURE: rounded shoulders, forward head, decreased lumbar lordosis, and flexed trunk   PALPATION: Reduced segmental mobility, tension in bil lumbar musculature and gluteals   LUMBAR ROM:  Lumbar A/ROM limited by 50% with limited segmental mobility in the lumbar spine  LOWER EXTREMITY ROM:    Hip IR limited by 50-60%, hamstring length limited by 50-60%, all other hip flexibility limited by 50%.    LOWER EXTREMITY MMT:   Hips 4/5, knees 4+/5 (limited knee extension due to shortened hamstrings)   LUMBAR SPECIAL TESTS:  Straight leg raise test: Negative  FUNCTIONAL TESTS:  Sit to stand without UE support  GAIT: Distance walked: 50 Assistive device utilized: None Level of assistance: Modified independence Comments: flexed trunk, wide base of support   TODAY'S TREATMENT:     01/06/23:  NuStep Level 4 x 8 minutes- PT monitored for pain Seated hamstring stretch 3x20 seconds  Standing hip abduction & ext 15x Bil- standing on balance pad Sit to stand: 5# kettle bell 2x10 Bridge with ball squeeze 5" x20 Manual: addaday to bil Lt low back and gluteals x 10 min  01/04/23:  NuStep Level 4 x 8 minutes- PT monitored for pain Bil hip flexor stretch at second step 10x Standing hip abduction & ext 15x Bil- standing on balance pad Sit to stand: 5# kettle bell 2x10 Bridge with ball squeeze 5" x20 red loop clamshells 2x15 with PPT Manual: addaday to bil Lt low back and gluteals x 10 min  12/30/22:  NuStep Level 4 x 10 minutes- PTA monitored for pain Bil hip flexor stretch at second step 10x Standing hip abduction & ext 15x Bil- standing on balance pad Sit to stand: 5# kettle bell 2x10 Bridge with ball squeeze 5" x20 red loop clamshells 2x15 with PPT Manual: addaday to bil Lt low back and gluteals x 10 min  PATIENT EDUCATION:  Education details: Access Code: [email or web address] Person educated: Patient Education method: Explanation, Demonstration, and  Handouts Education comprehension: verbalized understanding and returned demonstration  HOME EXERCISE PROGRAM: Access Code: [facility]  URL: [email or web address] Date: 10/08/2022 Prepared by: Claiborne Billings  Exercises - Seated Hamstring Stretch  - 3 x daily - 7 x weekly - 1 sets - 3 reps - 20 hold - Hooklying Single Knee to Chest Stretch  - 3 x daily - 7 x weekly - 1 sets - 3 reps - 20 hold - Supine Lower Trunk Rotation  - 3 x daily - 7 x weekly - 1 sets - 3 reps - 2 hold - Seated Figure 4 Piriformis Stretch  - 3 x daily - 7 x weekly - 1 sets - 3 reps - 20 hold - Clamshell  - 1 x daily - 7 x weekly - 1-2 sets - 10 reps - Supine Hip Adduction Isometric with Ball  - 1 x daily - 7 x weekly - 1-2 sets - 10 reps - 5 hold  ASSESSMENT:  CLINICAL IMPRESSION:  Pt continues to report 80% overall improvement in symptoms since the start of care.  Pt is using his reformer at home and stretching his legs.  Pt is working on building his endurance for walking to prepare for an upcoming trip. Pt limits his heavy lifting and is somewhat limited with recreation.    PT monitored for pain, technique and fatigue.  Patient will benefit from skilled PT to address the below impairments and improve overall function.     OBJECTIVE IMPAIRMENTS: decreased activity tolerance, decreased balance, decreased endurance, difficulty walking, decreased ROM, decreased strength, hypomobility, increased muscle spasms, impaired flexibility, improper body mechanics, postural dysfunction, and pain.   ACTIVITY LIMITATIONS: sitting, standing, transfers, locomotion level, and caring for others  PARTICIPATION LIMITATIONS: meal prep, cleaning, laundry, shopping, and community activity  PERSONAL FACTORS: Age, Social background, and 1-2 comorbidities: lumbar DDD, cares for wife  are also affecting patient's functional outcome.   REHAB POTENTIAL: Good  CLINICAL DECISION MAKING: Evolving/moderate complexity  EVALUATION COMPLEXITY:  Moderate   GOALS: Goals reviewed with patient? Yes  SHORT TERM GOALS: Target date: 11/03/2022   Be independent in initial HEP Baseline: Goal status: Goal me t 10/30/22  2.  Report > or = to 30% reduction in LBP with standing and walking  Baseline:  30-40% (11/23/22) Goal status: Goal met   3.  Initiate return to regular exercise routine including pilates  Baseline: doing stretches consistently (10/15/22) Goal status: in progress    LONG TERM GOALS: Target date: 01/08/23    Be independent in advanced HEP Baseline:  Goal status: MET (01/06/23)  2.  Improve FOTO to > or = to 72 Baseline:63 (01/06/23) Goal status:not met  3.  Report > or = to 70% reduction in LBP with standing and walking  Baseline: 80% (12/30/22) Goal status: MET  4.  Verbalize and demonstrate body mechanics modifications with lifting and carrying for lumbar protection Baseline:  Goal status: Goal met: 10/30/22  5.  Perform exercise routine regularly and verbalize modifications for safe progression Baseline: walking, doing pilates/reformer, and HEP (01/06/23) Goal status: MET   PLAN: D/C PT to HEP  PHYSICAL THERAPY DISCHARGE SUMMARY  Visits from Start of Care: 18  Current functional level related to goals / functional outcomes: See above.  Pt will D/C to HEP today.    Remaining deficits: Some intermittent LBP and limited heavy lifting due to this.    Education / Equipment: HEP, posture/mechanics    Patient agrees to discharge. Patient goals were partially met. Patient is being discharged due to being pleased with the current functional level.   Sigurd Sos, PT 01/06/23 11:43  AM   [facility] [address] [place], [place] [address] Phone # [phone] Fax [phone]

## 2023-01-11 ENCOUNTER — Other Ambulatory Visit: Payer: Self-pay

## 2023-01-11 MED ORDER — FREESTYLE LIBRE 2 SENSOR MISC: 2 refills | Status: DC

## 2023-01-14 ENCOUNTER — Ambulatory Visit (INDEPENDENT_AMBULATORY_CARE_PROVIDER_SITE_OTHER): Payer: Medicare PPO | Admitting: *Deleted

## 2023-01-14 DIAGNOSIS — E538 Deficiency of other specified B group vitamins: Secondary | ICD-10-CM | POA: Diagnosis not present

## 2023-01-14 MED ORDER — CYANOCOBALAMIN 1000 MCG/ML IJ SOLN
1000.0000 ug | Freq: Once | INTRAMUSCULAR | Status: AC
  Administered 2023-01-14: 1000 ug via INTRAMUSCULAR

## 2023-01-14 NOTE — Progress Notes (Signed)
Per orders of Dr. Fry, injection of Cyanocobalamin 1000mcg given by Nagee Goates A. Patient tolerated injection well.  

## 2023-01-19 ENCOUNTER — Ambulatory Visit (INDEPENDENT_AMBULATORY_CARE_PROVIDER_SITE_OTHER): Payer: Medicare PPO | Admitting: *Deleted

## 2023-01-19 DIAGNOSIS — E538 Deficiency of other specified B group vitamins: Secondary | ICD-10-CM | POA: Diagnosis not present

## 2023-01-19 MED ORDER — CYANOCOBALAMIN 1000 MCG/ML IJ SOLN
1000.0000 ug | Freq: Once | INTRAMUSCULAR | Status: AC
  Administered 2023-01-19: 1000 ug via INTRAMUSCULAR

## 2023-01-19 NOTE — Progress Notes (Signed)
Per orders of Dr. Sarajane Jews, injection of b12 given by Westley Hummer. Patient tolerated injection well.

## 2023-01-23 ENCOUNTER — Other Ambulatory Visit: Payer: Self-pay | Admitting: Family Medicine

## 2023-01-26 ENCOUNTER — Ambulatory Visit (INDEPENDENT_AMBULATORY_CARE_PROVIDER_SITE_OTHER): Payer: Medicare PPO | Admitting: *Deleted

## 2023-01-26 DIAGNOSIS — E538 Deficiency of other specified B group vitamins: Secondary | ICD-10-CM

## 2023-01-26 MED ORDER — CYANOCOBALAMIN 1000 MCG/ML IJ SOLN
1000.0000 ug | Freq: Once | INTRAMUSCULAR | Status: AC
  Administered 2023-01-26: 1000 ug via INTRAMUSCULAR

## 2023-01-26 NOTE — Progress Notes (Signed)
Per orders of Dr. Fry, injection of Cyanocobalamin 1000mcg given by Jaaliyah Lucatero A. Patient tolerated injection well.  

## 2023-02-09 ENCOUNTER — Ambulatory Visit (INDEPENDENT_AMBULATORY_CARE_PROVIDER_SITE_OTHER): Payer: Medicare PPO | Admitting: *Deleted

## 2023-02-09 DIAGNOSIS — E538 Deficiency of other specified B group vitamins: Secondary | ICD-10-CM

## 2023-02-09 MED ORDER — CYANOCOBALAMIN 1000 MCG/ML IJ SOLN
1000.0000 ug | Freq: Once | INTRAMUSCULAR | Status: AC
  Administered 2023-02-09: 1000 ug via INTRAMUSCULAR

## 2023-02-09 NOTE — Progress Notes (Unsigned)
Per orders of Dr. Fry, injection of Cyanocobalamin 1000mcg given by Hershall Benkert A. Patient tolerated injection well.  

## 2023-02-17 ENCOUNTER — Ambulatory Visit: Payer: Medicare PPO

## 2023-02-22 ENCOUNTER — Ambulatory Visit (INDEPENDENT_AMBULATORY_CARE_PROVIDER_SITE_OTHER): Payer: Medicare PPO

## 2023-02-22 DIAGNOSIS — E538 Deficiency of other specified B group vitamins: Secondary | ICD-10-CM

## 2023-02-22 MED ORDER — CYANOCOBALAMIN 1000 MCG/ML IJ SOLN
1000.0000 ug | Freq: Once | INTRAMUSCULAR | Status: AC
  Administered 2023-02-22: 1000 ug via INTRAMUSCULAR

## 2023-02-22 NOTE — Progress Notes (Signed)
Per orders of Dr. Clent Ridges, injection of Cyanocobalamin Inj. 1000 mcg/mL given by Vickii Chafe Left Deltoid. Patient tolerated injection well.  Next inject is scheduled on April 22,2024.

## 2023-03-01 ENCOUNTER — Ambulatory Visit (INDEPENDENT_AMBULATORY_CARE_PROVIDER_SITE_OTHER): Payer: Medicare PPO

## 2023-03-01 DIAGNOSIS — E538 Deficiency of other specified B group vitamins: Secondary | ICD-10-CM

## 2023-03-01 MED ORDER — CYANOCOBALAMIN 1000 MCG/ML IJ SOLN
1000.0000 ug | Freq: Once | INTRAMUSCULAR | Status: AC
  Administered 2023-03-01: 1000 ug via INTRAMUSCULAR

## 2023-03-01 NOTE — Progress Notes (Signed)
Per orders of Dr. Fry, injection of Cyanocobalamin 1000 mcg given by Chirstina Haan L Kienan Doublin. °Patient tolerated injection well.  °

## 2023-03-08 ENCOUNTER — Other Ambulatory Visit (INDEPENDENT_AMBULATORY_CARE_PROVIDER_SITE_OTHER): Payer: Medicare PPO

## 2023-03-08 DIAGNOSIS — E538 Deficiency of other specified B group vitamins: Secondary | ICD-10-CM | POA: Diagnosis not present

## 2023-03-08 LAB — VITAMIN B12: Vitamin B-12: 649 pg/mL (ref 211–911)

## 2023-03-08 LAB — BASIC METABOLIC PANEL
BUN: 21 mg/dL (ref 6–23)
CO2: 28 mEq/L (ref 19–32)
Calcium: 8.8 mg/dL (ref 8.4–10.5)
Chloride: 102 mEq/L (ref 96–112)
Creatinine, Ser: 0.87 mg/dL (ref 0.40–1.50)
GFR: 81.51 mL/min (ref >60.00)
Glucose, Bld: 132 mg/dL — ABNORMAL HIGH (ref 70–99)
Potassium: 3.7 mEq/L (ref 3.5–5.1)
Sodium: 139 mEq/L (ref 135–145)

## 2023-03-10 ENCOUNTER — Ambulatory Visit (INDEPENDENT_AMBULATORY_CARE_PROVIDER_SITE_OTHER): Payer: Medicare PPO

## 2023-03-10 DIAGNOSIS — L720 Epidermal cyst: Secondary | ICD-10-CM | POA: Diagnosis not present

## 2023-03-10 DIAGNOSIS — D225 Melanocytic nevi of trunk: Secondary | ICD-10-CM | POA: Diagnosis not present

## 2023-03-10 DIAGNOSIS — L281 Prurigo nodularis: Secondary | ICD-10-CM | POA: Diagnosis not present

## 2023-03-10 DIAGNOSIS — D2262 Melanocytic nevi of left upper limb, including shoulder: Secondary | ICD-10-CM | POA: Diagnosis not present

## 2023-03-10 DIAGNOSIS — L570 Actinic keratosis: Secondary | ICD-10-CM | POA: Diagnosis not present

## 2023-03-10 DIAGNOSIS — E538 Deficiency of other specified B group vitamins: Secondary | ICD-10-CM

## 2023-03-10 DIAGNOSIS — L821 Other seborrheic keratosis: Secondary | ICD-10-CM | POA: Diagnosis not present

## 2023-03-10 DIAGNOSIS — L578 Other skin changes due to chronic exposure to nonionizing radiation: Secondary | ICD-10-CM | POA: Diagnosis not present

## 2023-03-10 DIAGNOSIS — I872 Venous insufficiency (chronic) (peripheral): Secondary | ICD-10-CM | POA: Diagnosis not present

## 2023-03-10 DIAGNOSIS — D2272 Melanocytic nevi of left lower limb, including hip: Secondary | ICD-10-CM | POA: Diagnosis not present

## 2023-03-10 MED ORDER — CYANOCOBALAMIN 1000 MCG/ML IJ SOLN
1000.0000 ug | Freq: Once | INTRAMUSCULAR | Status: AC
Start: 2023-03-10 — End: 2023-03-10
  Administered 2023-03-10: 1000 ug via INTRAMUSCULAR

## 2023-03-10 NOTE — Progress Notes (Signed)
Per orders of Dr. Fry, injection of Cyanocobalamin 1000 mcg given by Tahje Borawski L Chanya Chrisley. °Patient tolerated injection well.  °

## 2023-03-11 ENCOUNTER — Other Ambulatory Visit: Payer: Self-pay | Admitting: Internal Medicine

## 2023-03-28 IMAGING — MR MR ABDOMEN WO/W CM
18 of 20 series · 44 of 48 positions shown · IV contrast (17 ml multihance)
Comparison: CT abdomen and pelvis 02/28/2021

CLINICAL DATA: Chronic pancreatitis

EXAM:
MRI ABDOMEN WITHOUT AND WITH CONTRAST
TECHNIQUE: Multiplanar multisequence MR imaging of the abdomen was performed
both before and after the administration of intravenous contrast.
CONTRAST:  17mL MULTIHANCE GADOBENATE DIMEGLUMINE 529 MG/ML IV SOLN

[Series 3: T2 · coronal · 5.0mm · 1.56mm/px · 2 of 36 slices shown (1 of 5)]
[im 1/36]
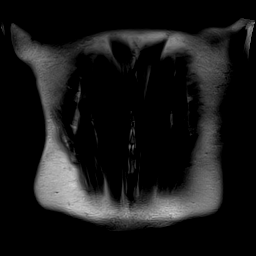
[im 36/36]
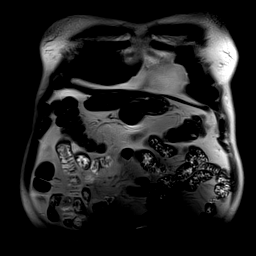

[Series 5: T2 · axial · 5.0mm · 1.48mm/px · z∈[-92,+190]mm · 2 of 48 slices shown (2 of 5)]
[im 1/48]
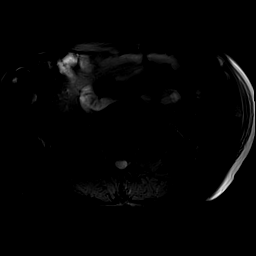
[im 48/48]
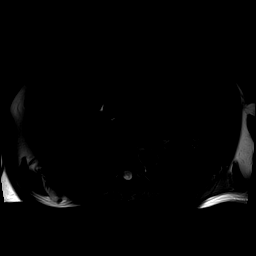

[Series 6: DWI · axial · 5.0mm · 1.42mm/px · z∈[-122,+160]mm · 5 of 144 slices shown (1 of 2)]
[im 1/144]
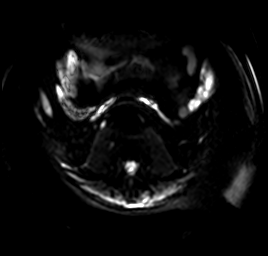
[im 36/144]
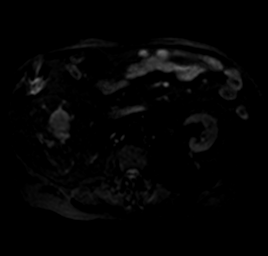
[im 72/144]
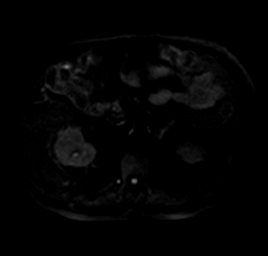
[im 108/144]
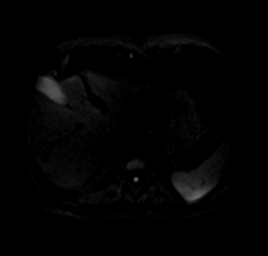
[im 144/144]
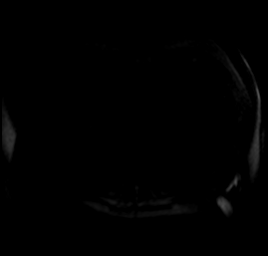

[Series 7: DWI · axial · 5.0mm · 1.42mm/px · z∈[-122,+160]mm · 2 of 48 slices shown (2 of 2)]
[im 1/48]
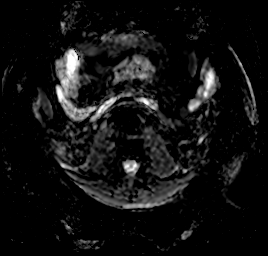
[im 48/48]
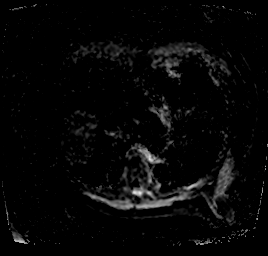

[Series 8: T2 · coronal · 5.0mm · 1.56mm/px · 1 of 36 slices shown (3 of 5)]
[im 1/36]
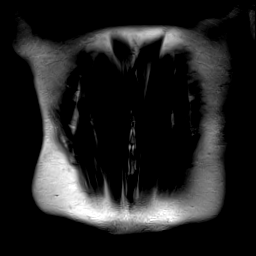

[Series 9: MRCP · coronal · 1.0mm · 0.49mm/px · 2 of 72 slices shown]
[im 1/72]
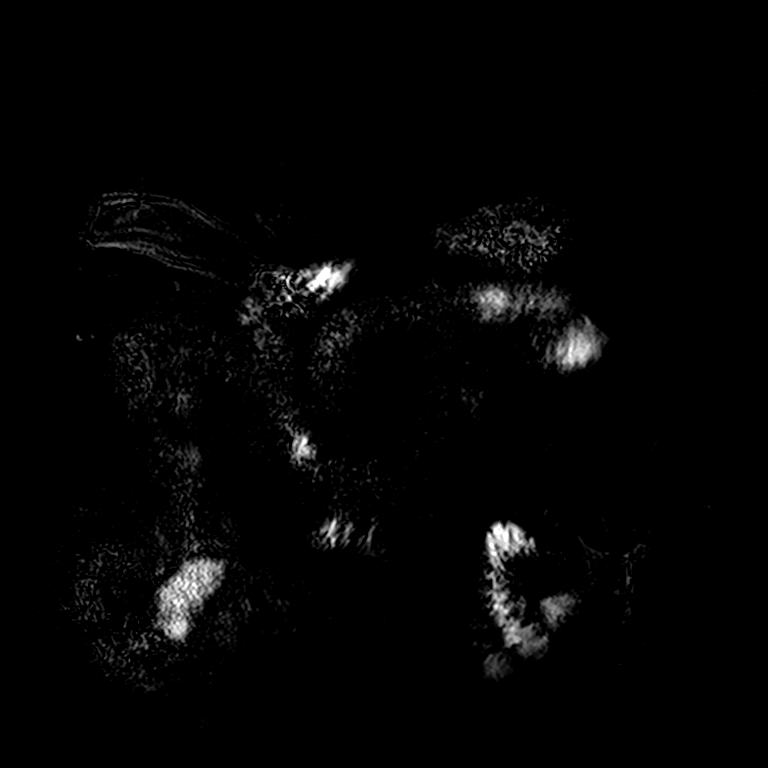
[im 72/72]
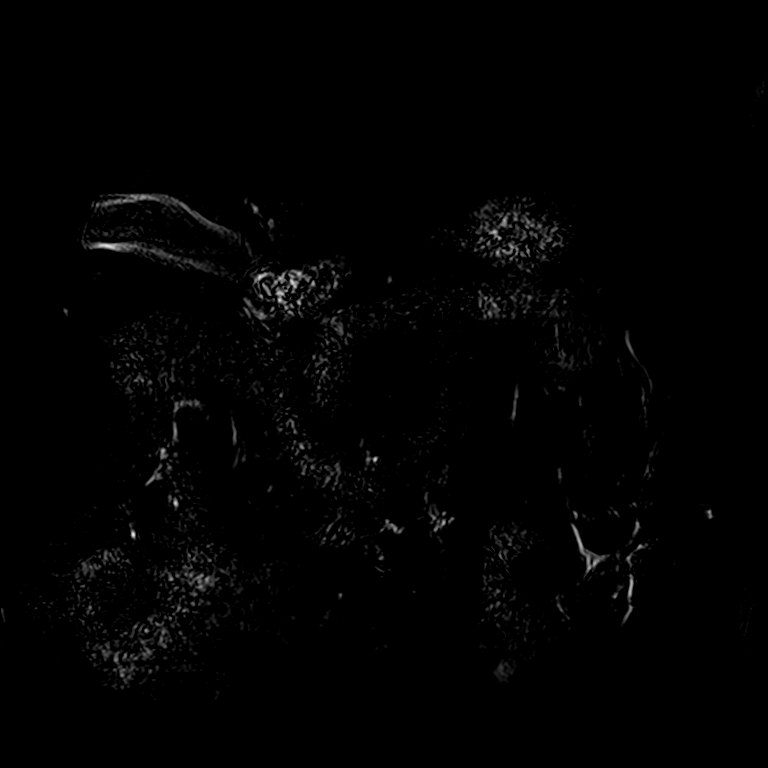

[Series 29: T2 · axial · 6.0mm · 1.22mm/px · 1 of 39 slices shown (4 of 5)]
[im 1/39]
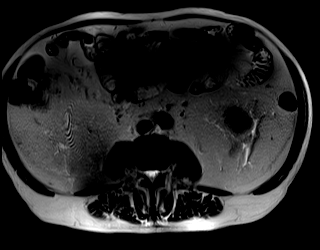

[Series 30: bSSFP · axial · 5.0mm · 1.25mm/px · z∈[-112,+164]mm · 2 of 47 slices shown]
[im 1/47]
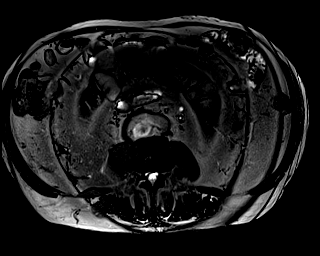
[im 47/47]
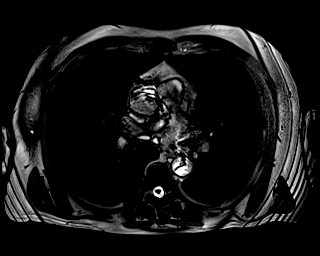

[Series 31: T2 · coronal · 3.0mm · 1.19mm/px · 1 of 19 slices shown (5 of 5)]
[im 1/19]
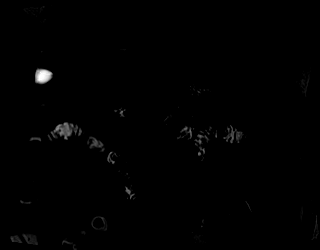

[Series 35: T1 dynamic · axial · non-contrast · 3.0mm · 1.25mm/px · z∈[-121,+164]mm · 3 of 96 slices shown]
[im 1/96]
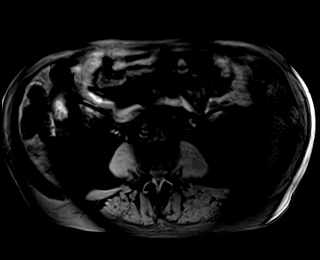
[im 48/96]
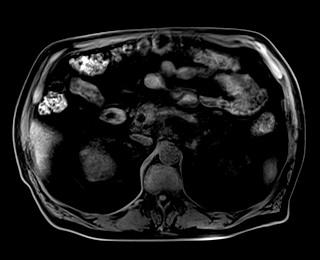
[im 96/96]
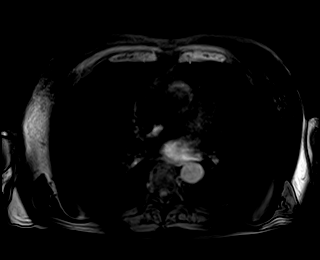

[Series 36: T1 dynamic post-contrast · axial · 3.0mm · 1.25mm/px · z∈[-121,+164]mm · 3 of 96 slices shown (1 of 8)]
[im 1/96]
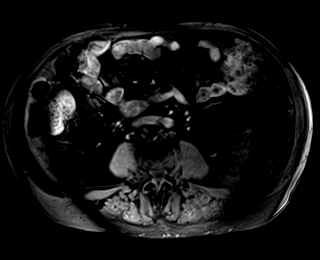
[im 48/96]
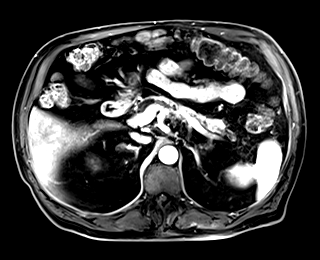
[im 96/96]
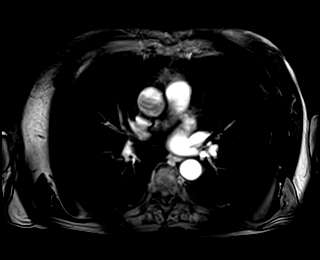

[Series 37: T1 dynamic post-contrast · axial · 3.0mm · 1.25mm/px · z∈[-121,+164]mm · 3 of 96 slices shown (2 of 8)]
[im 1/96]
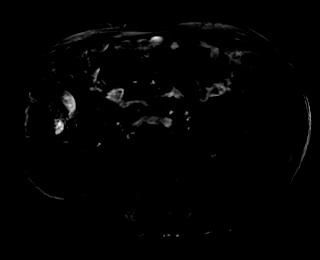
[im 48/96]
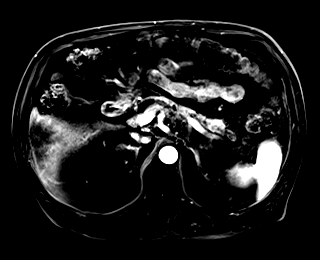
[im 96/96]
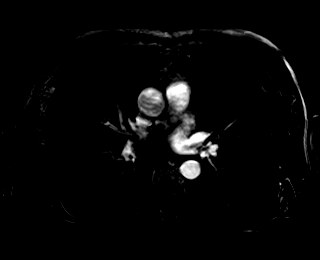

[Series 38: T1 dynamic post-contrast · axial · 3.0mm · 1.25mm/px · z∈[-121,+164]mm · 3 of 96 slices shown (3 of 8)]
[im 1/96]
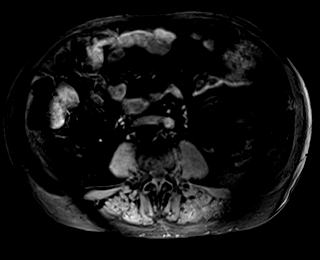
[im 48/96]
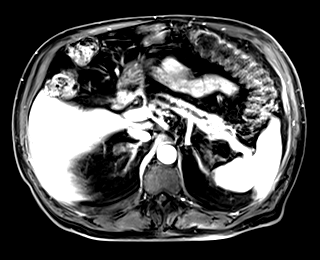
[im 96/96]
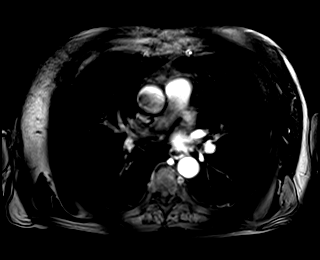

[Series 39: T1 dynamic post-contrast · axial · 3.0mm · 1.25mm/px · z∈[-121,+164]mm · 3 of 96 slices shown (4 of 8)]
[im 1/96]
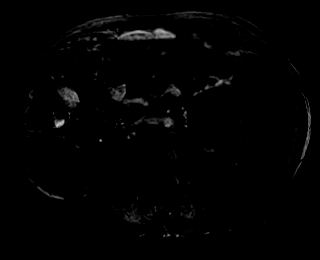
[im 48/96]
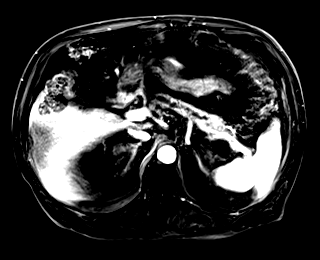
[im 96/96]
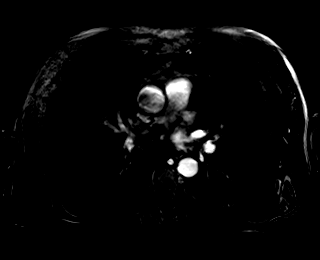

[Series 40: T1 dynamic post-contrast · axial · 3.0mm · 1.25mm/px · z∈[-121,+164]mm · 3 of 96 slices shown (5 of 8)]
[im 1/96]
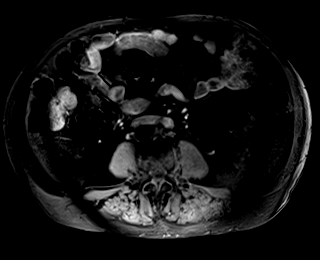
[im 48/96]
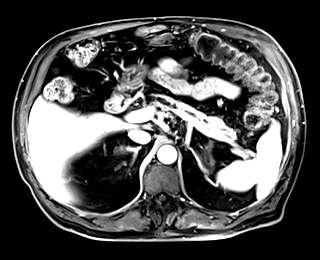
[im 96/96]
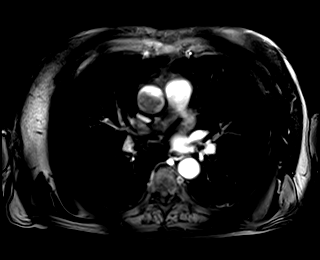

[Series 41: T1 dynamic post-contrast · axial · 3.0mm · 1.25mm/px · z∈[-121,+164]mm · 3 of 96 slices shown (6 of 8)]
[im 1/96]
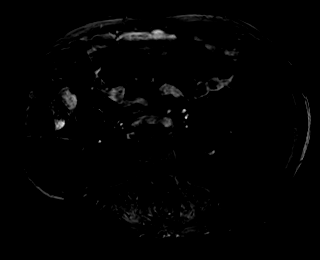
[im 48/96]
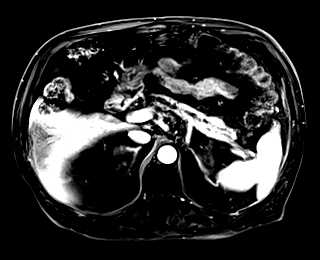
[im 96/96]
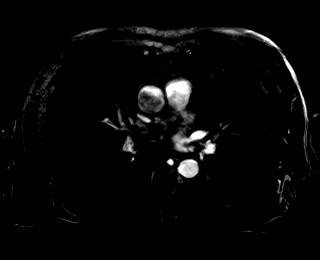

[Series 42: T1 dynamic post-contrast · coronal · 3.0mm · 1.25mm/px · 2 of 72 slices shown (7 of 8)]
[im 1/72]
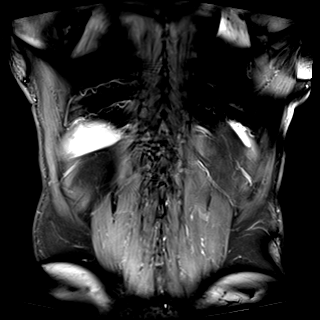
[im 72/72]
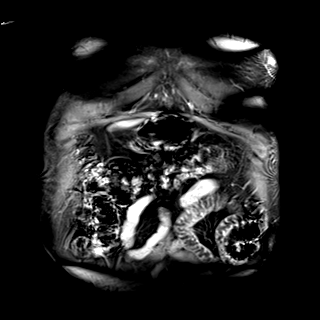

[Series 43: T1 dynamic post-contrast · axial · 3.0mm · 1.25mm/px · z∈[-121,+164]mm · 3 of 96 slices shown (8 of 8)]
[im 1/96]
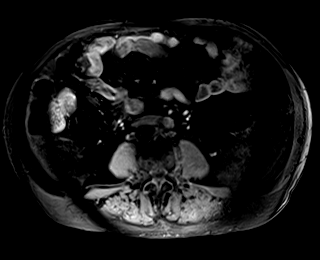
[im 48/96]
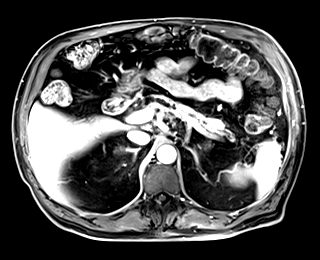
[im 96/96]
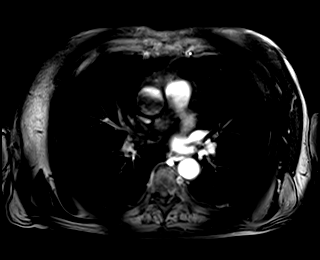

[44 of 48 positions shown; findings below may reference images not displayed]

FINDINGS: Study is limited due to motion.

Lower chest: No acute findings.

Hepatobiliary: Liver is normal in size and contour with no
suspicious mass identified. Gallbladder appears normal. No biliary
ductal dilatation identified.

Pancreas: Pancreatic duct is mildly tortuous and diffusely dilated
measuring up to 1.4 cm in diameter in the head of the pancreas near
the ampulla. There is an approximately 1.3 cm calculus within the
duct near the ampulla as well as smaller debris/calculi more
proximally in the pancreatic duct within the head of the pancreas.
Pancreas is moderately atrophic. No definite acute inflammatory
changes of the pancreas.

Spleen:  Within normal limits in size and appearance.

Adrenals/Urinary Tract: Adrenal glands appear normal. Interval
enlargement of a complex cystic mass in the mid right kidney which
demonstrates internal hypointense T2 nodular signal inferiorly with
apparent mild enhancement. This lesion appears retrospectively
enlarged since previous CT and ultrasound. No additional renal
lesions identified. No hydronephrosis.

Stomach/Bowel: Visualized portions within the abdomen are
unremarkable.

Vascular/Lymphatic: No pathologically enlarged lymph nodes
identified. No abdominal aortic aneurysm demonstrated.

Other:  No ascites.

Musculoskeletal: No suspicious bone lesions identified.
IMPRESSION: 1. Complex cystic mass in the mid right kidney with nodular apparent
enhancing component suspicious for neoplasm.
2. Evidence of chronic pancreatitis. Intraductal calculi and debris
including a prominent obstructing calculus near the ampulla.
Associated pancreatic ductal dilatation and tortuosity.

## 2023-04-01 ENCOUNTER — Ambulatory Visit: Payer: Medicare PPO | Admitting: Internal Medicine

## 2023-04-01 ENCOUNTER — Encounter: Payer: Self-pay | Admitting: Internal Medicine

## 2023-04-01 VITALS — BP 124/80 | HR 73 | Ht 66.0 in | Wt 188.0 lb

## 2023-04-01 DIAGNOSIS — K529 Noninfective gastroenteritis and colitis, unspecified: Secondary | ICD-10-CM | POA: Diagnosis not present

## 2023-04-01 DIAGNOSIS — K861 Other chronic pancreatitis: Secondary | ICD-10-CM | POA: Diagnosis not present

## 2023-04-01 DIAGNOSIS — Z86010 Personal history of colonic polyps: Secondary | ICD-10-CM | POA: Diagnosis not present

## 2023-04-01 DIAGNOSIS — E119 Type 2 diabetes mellitus without complications: Secondary | ICD-10-CM | POA: Diagnosis not present

## 2023-04-01 DIAGNOSIS — Z7984 Long term (current) use of oral hypoglycemic drugs: Secondary | ICD-10-CM | POA: Diagnosis not present

## 2023-04-01 LAB — POCT GLYCOSYLATED HEMOGLOBIN (HGB A1C): Hemoglobin A1C: 6.6 % — AB (ref 4.0–5.6)

## 2023-04-01 MED ORDER — GLIPIZIDE 5 MG PO TABS: 10.0000 mg | ORAL_TABLET | Freq: Every day | ORAL | 3 refills | Status: DC

## 2023-04-01 MED ORDER — METFORMIN HCL ER 500 MG PO TB24: 1000.0000 mg | ORAL_TABLET | Freq: Two times a day (BID) | ORAL | 3 refills | Status: DC

## 2023-04-01 NOTE — Patient Instructions (Signed)
-   Change  Glipizide 5 mg ,2 tablets before Breakfast  - Continue Metformin 500 mg, 2 tablet before Breakfast and 2 tablet before Supper     HOW TO TREAT LOW BLOOD SUGARS (Blood sugar LESS THAN 70 MG/DL) Please follow the RULE OF 15 for the treatment of hypoglycemia treatment (when your (blood sugars are less than 70 mg/dL)   STEP 1: Take 15 grams of carbohydrates when your blood sugar is low, which includes:  3-4 GLUCOSE TABS  OR 3-4 OZ OF JUICE OR REGULAR SODA OR ONE TUBE OF GLUCOSE GEL    STEP 2: RECHECK blood sugar in 15 MINUTES STEP 3: If your blood sugar is still low at the 15 minute recheck --> then, go back to STEP 1 and treat AGAIN with another 15 grams of carbohydes

## 2023-04-01 NOTE — Progress Notes (Signed)
Name: Walter Campbell  Age/ Sex: 82 y.o., male   MRN/ DOB: [id number], 11/22/1940     PCP: Nelwyn Salisbury, MD   Reason for Endocrinology Evaluation: Type 2 Diabetes Mellitus  Initial Endocrine Consultative Visit: 03/10/2021    PATIENT IDENTIFIER: Walter Campbell is a 82 y.o. male with a past medical history of T2Dm, HTN and dyslipidemia and Hx of pancreatitis  . The patient has followed with Endocrinology clinic since 03/10/2021 for consultative assistance with management of his diabetes.  DIABETIC HISTORY:  Walter Campbell was diagnosed with DM in 2010, januvia has been ineffective.Marland Kitchen His hemoglobin A1c has ranged from 6.7% in 2021, peaking at 7.6% in 2022.   On his initial visit to our clinic he had an A1c 7.6% , we decreased Glipizide due to hypoglycemia and continued Metformin   SUBJECTIVE:   During the last visit (09/30/2022): A1c 6.6 %     Today (04/01/2023): Walter Campbell is here for a follow up on diabetes management . He checks his blood sugars multiple times daily, through CGM. 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    HOME DIABETES REGIMEN:  Glipizide 5 mg , half a tablet before breakfast- takes 2 QAM and 1 QPM  Metformin 500 mg, 2 tablet before Breakfast and 2 tablet before Supper - has been taking 2 tabs QPM and 1 QPM      Statin: yes ACE-I/ARB: yes Prior Diabetic Education: yes    CONTINUOUS GLUCOSE  MONITORING RECORD INTERPRETATION    Dates of Recording:5/10-5/23/2024  Sensor description: freestyle libre 2  Results statistics:   CGM use % of time 53  Average and SD 135/32.2  Time in range 79 %  % Time Above 180 19  % Time above 250 0  % Time Below target 2    Glycemic patterns summary: Optimaly BG's through the night, with postprandial hyperglycemia   Hyperglycemic episodes  post prandial   Hypoglycemic episodes occurred at night   Overnight periods: trends down      DIABETIC COMPLICATIONS: Microvascular complications:   Denies: CKD, retinopathy, neuropathy Last Eye Exam: Completed 11/2021  Macrovascular complications:   Denies: CAD, CVA, PVD   HISTORY:  Past Medical History:  Past Medical History:  Diagnosis Date   Arthritis    Diabetes mellitus    Hypertension    Palpitations 06/10/2010   Pancreatitis    Pneumonia    renal ca 05/20/2022   Past Surgical History:  Past Surgical History:  Procedure Laterality Date   APPENDECTOMY  1957   COLONOSCOPY  12/19/2018   per Dr. Matthias Hughs, adenmatous polyps, repeat in 3 years    IR RADIOLOGIST EVAL & MGMT  04/02/2022   IR RADIOLOGIST EVAL & MGMT  06/15/2022   KNEE SURGERY  1990s, 2015, 2016   Torn meniscus L scope 2016, R 2015, L 1991 scope   RADIOFREQUENCY ABLATION Right 05/20/2022   Procedure:  RIGHT CRYOABLATION;  Surgeon: Berdine Dance, MD;  Location: [facility];  Service: Anesthesiology;  Laterality: Right;   SALIVARY GLAND SURGERY     as baby- removal- states tumor   TONSILLECTOMY     Social History:  reports that he has never smoked. He has never used smokeless tobacco. He reports current alcohol use of about 1.0 - 7.0 standard drink of alcohol per week. He reports current drug use. Family History:  Family History  Problem Relation Age of Onset   Stroke Mother    Diabetes Mother    Osteoporosis Mother    Dementia Mother    Heart disease Mother        pacemaker   Hyperlipidemia Father        diet  controlled   CAD Father        stents in 66s or 45s   Arthritis Sister    Hyperthyroidism Sister      HOME MEDICATIONS: Allergies as of 04/01/2023   No Known Allergies      Medication List        Accurate as of Apr 01, 2023 10:05 AM. If you have any questions, ask your nurse or doctor.          STOP taking these medications    ibuprofen 200 MG tablet Commonly known as: ADVIL Stopped by: Scarlette Shorts, MD       TAKE these medications    atorvastatin 20 MG tablet Commonly known as: LIPITOR TAKE 1 TABLET BY MOUTH ON MONDAYS, WEDNESDAYS AND FRIDAYS   benazepril-hydrochlorthiazide 20-25 MG tablet Commonly known as: LOTENSIN HCT Take 1 tablet by mouth daily.   colestipol 1 g tablet Commonly known as: COLESTID Take 1 g by mouth 2 (two) times daily.   Creon 36000 UNITS Cpep capsule Generic drug: lipase/protease/amylase Take 1-2 capsules by mouth See admin instructions. Take 2 capsule with each meal and 1 capsule with a snack   felodipine 5 MG 24 hr tablet Commonly known as: PLENDIL Take 1 tablet (5 mg total) by mouth daily.   FreeStyle Libre 2 Sensor Misc CHANGE EVERY 14 DAYS   glipiZIDE 5 MG tablet Commonly known as: GLUCOTROL Take 0.5 tablets (2.5 mg total) by mouth daily before breakfast. What changed:  how much to take additional instructions   loratadine 10 MG tablet Commonly known as: CLARITIN Take 10 mg by mouth daily as needed for allergies.   metFORMIN 500 MG 24 hr tablet Commonly known as: GLUCOPHAGE-XR TAKE 2 TABLETS BY MOUTH EVERY MORNING & AT BEDTIME What changed: See the new instructions.   OVER THE COUNTER MEDICATION Apply 1 Application topically daily as needed (pain). CBD cream   potassium chloride 10 MEQ tablet Commonly known as: KLOR-CON M Take 1 tablet (10 mEq total) by mouth 2 (two) times daily.   tamsulosin 0.4 MG Caps capsule Commonly known as: FLOMAX Take 1 capsule (0.4 mg total) by mouth daily.   triamcinolone  cream 0.1 % Commonly known as: KENALOG Apply 1 Application topically 2 (two) times daily.         OBJECTIVE:   Vital Signs: BP 124/80 (BP Location: Left Arm, Patient Position: Sitting, Cuff Size: Small)   Pulse 73   Ht 5\' 6"  (1.676 m)   Wt 188 lb (85.3 kg)   SpO2 97%   BMI 30.34 kg/m   Wt Readings from Last 3 Encounters:  04/01/23 188 lb (85.3 kg)  12/08/22 189 lb (85.7 kg)  12/03/22 187 lb (84.8 kg)  Exam: General: Pt appears well and is in NAD  Lungs: Clear with good BS bilat   Heart: RRR   Extremities: Trace pretibial edema.   Neuro: MS is good with appropriate affect, pt is alert and Ox3       DM foot exam: 04/01/2023   The skin of the feet is intact without sores or ulcerations. The pedal pulses are 2+ on right and 2+ on left. The sensation is intact to a screening 5.07, 10 gram monofilament bilaterally   DATA REVIEWED:  Lab Results  Component Value Date   HGBA1C 6.6 (A) 04/01/2023   HGBA1C 6.6 (A) 09/30/2022   HGBA1C 6.6 (H) 05/14/2022    Latest Reference Range & Units 03/08/23 09:00  Sodium 135 - 145 mEq/L 139  Potassium 3.5 - 5.1 mEq/L 3.7  Chloride 96 - 112 mEq/L 102  CO2 19 - 32 mEq/L 28  Glucose 70 - 99 mg/dL 960 (H)  BUN 6 - 23 mg/dL 21  Creatinine 4.54 - 0.98 mg/dL 1.19  Calcium 8.4 - 14.7 mg/dL 8.8  GFR >82.95 mL/min 81.51  (H): Data is abnormally high  ASSESSMENT / PLAN / RECOMMENDATIONS:   1) Type 2 Diabetes Mellitus, Optimally controlled, Without complications - Most recent A1c of 6.6%. Goal A1c < 7.0 %.     -A1c remains stable but patient has been noted with recurrent hypoglycemia at night -On his last visit to our office I had decreased his glipizide to half a tablet daily, but after losing his wife and due to stress and other circumstances he has been noted with hypoglycemia, and has self increased glipizide to 2 tablets in the morning, and 1 in the evening, he has also self decreased evening dose of metformin to 1 tablet -I  explained to the patient no need to decrease metformin as there is no risk of hypoglycemia but would rather decrease glipizide  - Will avoid GLp-1 agonists and DPP-4 inhibitors due to history of pancreatitis -We briefly discussed SGLT2 inhibitors if needed in the future -I will decrease glipizide as below, patient was Taking it in the evenings  MEDICATIONS: Change glipizide 5 mg , 2 tablet before breakfast Continue Metformin 500 mg, 2 tablet before Breakfast and 2 tablet before Supper    EDUCATION / INSTRUCTIONS: BG monitoring instructions: Patient is instructed to check his blood sugars 3 times a day, before meals . Call [facility] if: BG persistently < 70  I reviewed the Rule of 15 for the treatment of hypoglycemia in detail with the patient. Literature supplied.     2) Diabetic complications:  Eye: Does not have known diabetic retinopathy.  Neuro/ Feet: Does not have known diabetic peripheral neuropathy .  Renal: Patient does not have known baseline CKD. He   is  on an ACEI/ARB at present.      F/U in 6 months     I spent 29 minutes preparing to see the patient by review of recent labs, imaging and procedures, obtaining and reviewing separately obtained history, communicating with the patien, ordering medications,, and documenting clinical information in the EHR including the differential Dx, treatment, and any further evaluation and other management    Signed electronically by: Lyndle Herrlich, MD  [facility]  [facility] [address] [place], [place] [address] Phone: [phone] FAX: [phone]   CC: Nelwyn Salisbury, MD [address] [place] [address] Phone: [phone]  Fax: [phone]  Return to Endocrinology clinic as below:  Future Appointments  Date Time Provider Department Center  07/14/2023  1:00 PM [facility]-ANNUAL WELLNESS VISIT [facility]

## 2023-06-10 ENCOUNTER — Other Ambulatory Visit: Payer: Self-pay | Admitting: Interventional Radiology

## 2023-06-10 DIAGNOSIS — N2889 Other specified disorders of kidney and ureter: Secondary | ICD-10-CM

## 2023-06-24 ENCOUNTER — Ambulatory Visit (HOSPITAL_COMMUNITY)
Admission: RE | Admit: 2023-06-24 | Discharge: 2023-06-24 | Disposition: A | Discharge status: Home / Self Care | Payer: Medicare PPO | Source: Ambulatory Visit | Attending: Interventional Radiology | Admitting: Interventional Radiology

## 2023-06-24 ENCOUNTER — Encounter (INDEPENDENT_AMBULATORY_CARE_PROVIDER_SITE_OTHER): Payer: Self-pay

## 2023-06-24 DIAGNOSIS — N2889 Other specified disorders of kidney and ureter: Secondary | ICD-10-CM | POA: Diagnosis not present

## 2023-06-24 DIAGNOSIS — K8689 Other specified diseases of pancreas: Secondary | ICD-10-CM | POA: Diagnosis not present

## 2023-06-24 DIAGNOSIS — K861 Other chronic pancreatitis: Secondary | ICD-10-CM | POA: Diagnosis not present

## 2023-06-24 DIAGNOSIS — C641 Malignant neoplasm of right kidney, except renal pelvis: Secondary | ICD-10-CM | POA: Diagnosis not present

## 2023-06-24 LAB — POCT I-STAT CREATININE: Creatinine, Ser: 0.9 mg/dL (ref 0.61–1.24)

## 2023-06-24 MED ORDER — SODIUM CHLORIDE (PF) 0.9 % IJ SOLN
INTRAMUSCULAR | Status: AC
  Filled 2023-06-24: qty 50

## 2023-06-24 MED ORDER — IOHEXOL 300 MG/ML  SOLN
100.0000 mL | Freq: Once | INTRAMUSCULAR | Status: AC | PRN
  Administered 2023-06-24: 100 mL via INTRAVENOUS

## 2023-06-30 ENCOUNTER — Ambulatory Visit
Admission: RE | Admit: 2023-06-30 | Discharge: 2023-06-30 | Disposition: A | Discharge status: Home / Self Care | Payer: Medicare PPO | Source: Ambulatory Visit | Attending: Interventional Radiology | Admitting: Interventional Radiology

## 2023-06-30 DIAGNOSIS — N2889 Other specified disorders of kidney and ureter: Secondary | ICD-10-CM | POA: Diagnosis not present

## 2023-06-30 HISTORY — PX: IR RADIOLOGIST EVAL & MGMT: IMG5224

## 2023-06-30 NOTE — Progress Notes (Signed)
Patient ID: Walter Campbell, male   DOB: 23-May-1941, 82 y.o.   MRN: [id number]       Chief Complaint:  Right renal neoplasm by imaging  Referring Physician(s): Dr. Liliane Shi  History of Present Illness: Walter Campbell is a 82 y.o. male Who is now just over 1 year status post right renal neoplasm CT-guided cryoablation.  Procedure performed 05/20/2022 [facility].  Overall he continues to do well and is fully recovered.  No physical limitations.  Currently is asymptomatic.  No flank pain, abdominal pain, or other urinary tract symptoms.  No hematuria.  No recent illness or fevers.  Interval surveillance imaging performed 06/24/2023.  This confirms a stable right kidney midpole retracting ablation defect without evidence of residual or recurrent disease.  No additional renal abnormality or delay complication.  No acute process.  Past Medical History:  Diagnosis Date   Arthritis    Diabetes mellitus    Hypertension    Palpitations 06/10/2010   Pancreatitis    Pneumonia    renal ca 05/20/2022    Past Surgical History:  Procedure Laterality Date   APPENDECTOMY  1957   COLONOSCOPY  12/19/2018   per Dr. Matthias Hughs, adenmatous polyps, repeat in 3 years    IR RADIOLOGIST EVAL & MGMT  04/02/2022   IR RADIOLOGIST EVAL & MGMT  06/15/2022   KNEE SURGERY  1990s, 2015, 2016   Torn meniscus L scope 2016, R 2015, L 1991 scope   RADIOFREQUENCY ABLATION Right 05/20/2022   Procedure: RIGHT CRYOABLATION;  Surgeon: Berdine Dance, MD;  Location: [facility];  Service: Anesthesiology;  Laterality: Right;   SALIVARY GLAND SURGERY     as baby- removal- states tumor   TONSILLECTOMY      Allergies: Patient has no known allergies.  Medications: Prior to Admission medications   Medication Sig Start Date End Date Taking? Authorizing Provider  atorvastatin (LIPITOR) 20 MG tablet TAKE 1 TABLET BY MOUTH ON MONDAYS, [facility] AND FRIDAYS 12/03/22   Nelwyn Salisbury, MD  benazepril-hydrochlorthiazide  (LOTENSIN HCT) 20-25 MG tablet Take 1 tablet by mouth daily. 12/03/22   Nelwyn Salisbury, MD  colestipol (COLESTID) 1 g tablet Take 1 g by mouth 2 (two) times daily. 11/19/22   [provider]  Continuous Blood Gluc Sensor (FREESTYLE LIBRE 2 SENSOR) MISC CHANGE EVERY 14 DAYS 01/11/23   Shamleffer, Konrad Dolores, MD  CREON [phone] units CPEP capsule Take 1-2 capsules by mouth See admin instructions. Take 2 capsule with each meal and 1 capsule with a snack 02/03/22   [provider]  felodipine (PLENDIL) 5 MG 24 hr tablet Take 1 tablet (5 mg total) by mouth daily. 12/03/22   Nelwyn Salisbury, MD  glipiZIDE (GLUCOTROL) 5 MG tablet Take 2 tablets (10 mg total) by mouth daily before breakfast. 04/01/23   Shamleffer, Konrad Dolores, MD  loratadine (CLARITIN) 10 MG tablet Take 10 mg by mouth daily as needed for allergies.    [provider]  metFORMIN (GLUCOPHAGE-XR) 500 MG 24 hr tablet Take 2 tablets (1,000 mg total) by mouth 2 (two) times daily with a meal. 04/01/23   Shamleffer, Konrad Dolores, MD  OVER THE COUNTER MEDICATION Apply 1 Application topically daily as needed (pain). CBD cream    [provider]  potassium chloride (KLOR-CON M) 10 MEQ tablet Take 1 tablet (10 mEq total) by mouth 2 (two) times daily. 12/11/22   Nelwyn Salisbury, MD  tamsulosin (FLOMAX) 0.4 MG CAPS capsule Take 1 capsule (0.4 mg  total) by mouth daily. 12/03/22   Nelwyn Salisbury, MD  triamcinolone cream (KENALOG) 0.1 % Apply 1 Application topically 2 (two) times daily. 11/19/22   Philip Aspen, Limmie Patricia, MD     Family History  Problem Relation Age of Onset   Stroke Mother    Diabetes Mother    Osteoporosis Mother    Dementia Mother    Heart disease Mother        pacemaker   Hyperlipidemia Father        diet controlled   CAD Father        stents in 47s or 90s   Arthritis Sister    Hyperthyroidism Sister     Social History   Socioeconomic History   Marital status: Married    Spouse  name: Not on file   Number of children: Not on file   Years of education: Not on file   Highest education level: Not on file  Occupational History   Not on file  Tobacco Use   Smoking status: Never   Smokeless tobacco: Never  Vaping Use   Vaping status: Never Used  Substance and Sexual Activity   Alcohol use: Yes    Alcohol/week: 1.0 - 7.0 standard drink of alcohol    Types: 1 - 7 Standard drinks or equivalent per week    Comment: occas.   Drug use: Yes    Comment: cbd ointment   Sexual activity: Not on file  Other Topics Concern   Not on file  Social History Narrative   Married. 1 daughter, 2 step sons. 8 grandkids. No greatgrandkids.       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: golf, bridge, travel, reading   Social Determinants of Corporate investment banker Strain: Low Risk  (07/09/2022)   Overall Financial Resource Strain (CARDIA)    Difficulty of Paying Living Expenses: Not hard at all  Food Insecurity: No Food Insecurity (07/09/2022)   Hunger Vital Sign    Worried About Running Out of Food in the Last Year: Never true    Ran Out of Food in the Last Year: Never true  Transportation Needs: No Transportation Needs (07/09/2022)   PRAPARE - Administrator, Civil Service (Medical): No    Lack of Transportation (Non-Medical): No  Physical Activity: Sufficiently Active (07/09/2022)   Exercise Vital Sign    Days of Exercise per Week: 5 days    Minutes of Exercise per Session: 40 min  Stress: No Stress Concern Present (07/09/2022)   [employer] of Occupational Health - Occupational Stress Questionnaire    Feeling of Stress : Not at all  Social Connections: Moderately Isolated (07/09/2022)   Social Connection and Isolation Panel [NHANES]    Frequency of Communication with Friends and Family: More than three times a week    Frequency of Social Gatherings with Friends and Family: More than three times a week    Attends Religious  Services: Never    Database administrator or Organizations: No    Attends Banker Meetings: Never    Marital Status: Married       Review of Systems  Review of Systems: A 12 point ROS discussed and pertinent positives are indicated in the HPI above.  All other systems are negative.      Physical Exam No direct physical exam was performed   Telephone health visit only today to review interval surveillance imaging.  Vital Signs:  There were no vitals taken for this visit.  Imaging: No results found.  Labs:  CBC: Recent Labs    12/03/22 0844  WBC 6.1  HGB 13.2  HCT 39.5  PLT 279.0    COAGS: No results for input(s): "INR", "APTT" in the last 8760 hours.  BMP: Recent Labs    09/30/22 1135 12/03/22 0844 12/08/22 1455 03/08/23 0900 06/24/23 1530  NA 140 143 141 139  --   K 3.8 3.0* 3.2* 3.7  --   CL 102 99 100 102  --   CO2 32 33* 33* 28  --   GLUCOSE 183* 131* 237* 132*  --   BUN 18 18 18 21   --   CALCIUM 8.7 8.7 8.6 8.8  --   CREATININE 0.80 0.73 0.80 0.87 0.90    LIVER FUNCTION TESTS: Recent Labs    12/03/22 0844  BILITOT 0.5  AST 18  ALT 22  ALKPHOS 82  PROT 6.2  ALBUMIN 4.1       Assessment and Plan:  Over 1 year status post CT-guided cryoablation of a right kidney lateral midpole renal neoplasm by imaging.  Surveillance imaging demonstrates a retracting nonenhancing ablation defect.  No suspicious abnormality or residual disease by CT.  Imaging reviewed with the patient today by telephone.  All questions and concerns addressed.  Overall he continues to do very well.  Plan: Schedule for surveillance CT without and with contrast in 12 months (August 2025).  This can be performed at [facility].        Electronically Signed: Berdine Dance 06/30/2023, 12:25 PM   I spent a total of    25 Minutes in remote  clinical consultation, greater than 50% of which was counseling/coordinating care for This patient status post renal  cryoablation.    Visit type: Audio only (telephone). Audio (no video) only due to patient's lack of internet/smartphone capability. Alternative for in-person consultation at [facility], [address] [place], [place], [place].  his format is felt to be most appropriate for this patient at this time.  All issues noted in this document were discussed and addressed.

## 2023-07-14 ENCOUNTER — Telehealth: Payer: Self-pay

## 2023-07-14 NOTE — Telephone Encounter (Signed)
Unsuccessful attempt to reach patient on preferred number listed in notes for scheduled AWV. Left message on voicemail okay to reschedule. 

## 2023-07-20 ENCOUNTER — Encounter: Payer: Self-pay | Admitting: Family Medicine

## 2023-07-20 ENCOUNTER — Ambulatory Visit: Payer: Medicare PPO | Admitting: Family Medicine

## 2023-07-20 VITALS — BP 108/60 | HR 94 | Temp 98.1°F | Wt 188.0 lb

## 2023-07-20 DIAGNOSIS — Z23 Encounter for immunization: Secondary | ICD-10-CM | POA: Diagnosis not present

## 2023-07-20 DIAGNOSIS — M436 Torticollis: Secondary | ICD-10-CM

## 2023-07-20 DIAGNOSIS — I779 Disorder of arteries and arterioles, unspecified: Secondary | ICD-10-CM | POA: Diagnosis not present

## 2023-07-20 NOTE — Progress Notes (Signed)
   Subjective:    Patient ID: Walter Campbell, male    DOB: 04/21/1941, 82 y.o.   MRN: [id number]  HPI Here for 2 issues. First he woke up one week ago with pain in the left side of his posterior neck that has spread up into a generalized headache. No recent trauma. No radiation to the arms. He has applied heat and taken Ibuprofen. Also, he asks to check his carotid arteries. He had a [facility] screening test around 10 years ago, and he was told he had a minor blockage on one side. He is concerned that this may be related to his current symptoms.    Review of Systems  Constitutional: Negative.   Respiratory: Negative.    Cardiovascular: Negative.   Musculoskeletal:  Positive for neck pain and neck stiffness.  Neurological:  Positive for headaches.       Objective:   Physical Exam Constitutional:      General: He is not in acute distress.    Appearance: Normal appearance.  Neck:     Comments: He is tender along the left posterior neck and over the cervical spine. His ROM is full but he has pain with this.  Cardiovascular:     Rate and Rhythm: Normal rate and regular rhythm.     Pulses: Normal pulses.     Heart sounds: Normal heart sounds.  Pulmonary:     Effort: Pulmonary effort is normal.     Breath sounds: Normal breath sounds.  Neurological:     General: No focal deficit present.     Mental Status: He is alert and oriented to person, place, and time. Mental status is at baseline.           Assessment & Plan:  He has torticollis, and this has been causing tension headaches. He will continue with heat and Ibuprofen. He can do gentle stretches. He can also get a massage if he wants. I reassured him that this is not caused by any carotid blockages he may have. We will set up carotid dopplers to evaluate for this.  Gershon Crane, MD

## 2023-07-26 ENCOUNTER — Ambulatory Visit (INDEPENDENT_AMBULATORY_CARE_PROVIDER_SITE_OTHER): Payer: Medicare PPO

## 2023-07-26 VITALS — Ht 66.0 in | Wt 184.0 lb

## 2023-07-26 DIAGNOSIS — Z0000 Encounter for general adult medical examination without abnormal findings: Secondary | ICD-10-CM

## 2023-07-26 NOTE — Progress Notes (Signed)
Subjective:   Walter Campbell is a 82 y.o. male who presents for Medicare Annual/Subsequent preventive examination.  Visit Complete: Virtual  I connected with  Walter Campbell on 07/28/23 by a audio enabled telemedicine application and verified that I am speaking with the correct person using two identifiers.  Patient Location: Home  Provider Location: Home Office  I discussed the limitations of evaluation and management by telemedicine. The patient expressed understanding and agreed to proceed.  Vital Signs: Unable to obtain new vitals due to this being a telehealth visit.  .  Cardiac Risk Factors include: advanced age (>44men, >48 women);diabetes mellitus;male gender;hypertension     Objective:    Today's Vitals   07/26/23 1547  Weight: 184 lb (83.5 kg)  Height: 5\' 6"  (1.676 m)   Body mass index is 29.7 kg/m.     07/26/2023    3:56 PM 10/05/2022    2:57 PM 07/09/2022   10:02 AM 05/20/2022    7:17 AM 05/14/2022   10:19 AM 07/08/2021    2:44 PM 07/17/2020    2:06 PM  Advanced Directives  Does Patient Have a Medical Advance Directive? Yes Yes Yes Yes Yes Yes Yes  Type of Estate agent of [place];Living will Living will;Healthcare Power of [employer] Power of [place];Living will Healthcare Power of [place];Living will Living will;Healthcare Power of [employer] Power of [place];Living will Healthcare Power of [place];Living will  Does patient want to make changes to medical advance directive? No - Patient declined No - Patient declined No - Patient declined    No - Patient declined  Copy of Healthcare Power of Attorney in Chart? Yes - validated most recent copy scanned in chart (See row information) No - copy requested Yes - validated most recent copy scanned in chart (See row information)   No - copy requested No - copy requested    Current Medications (verified) Outpatient Encounter Medications as of 07/26/2023  Medication Sig    atorvastatin (LIPITOR) 20 MG tablet TAKE 1 TABLET BY MOUTH ON MONDAYS, WEDNESDAYS AND FRIDAYS   benazepril-hydrochlorthiazide (LOTENSIN HCT) 20-25 MG tablet Take 1 tablet by mouth daily.   colestipol (COLESTID) 1 g tablet Take 1 g by mouth 2 (two) times daily.   Continuous Blood Gluc Sensor (FREESTYLE LIBRE 2 SENSOR) MISC CHANGE EVERY 14 DAYS   CREON [phone] units CPEP capsule Take 1-2 capsules by mouth See admin instructions. Take 2 capsule with each meal and 1 capsule with a snack   felodipine (PLENDIL) 5 MG 24 hr tablet Take 1 tablet (5 mg total) by mouth daily.   glipiZIDE (GLUCOTROL) 5 MG tablet Take 2 tablets (10 mg total) by mouth daily before breakfast.   loratadine (CLARITIN) 10 MG tablet Take 10 mg by mouth daily as needed for allergies.   metFORMIN (GLUCOPHAGE-XR) 500 MG 24 hr tablet Take 2 tablets (1,000 mg total) by mouth 2 (two) times daily with a meal.   OVER THE COUNTER MEDICATION Apply 1 Application topically daily as needed (pain). CBD cream   potassium chloride (KLOR-CON M) 10 MEQ tablet Take 1 tablet (10 mEq total) by mouth 2 (two) times daily.   tamsulosin (FLOMAX) 0.4 MG CAPS capsule Take 1 capsule (0.4 mg total) by mouth daily.   triamcinolone cream (KENALOG) 0.1 % Apply 1 Application topically 2 (two) times daily.   No facility-administered encounter medications on file as of 07/26/2023.    Allergies (verified) Patient has no known allergies.   History: Past Medical History:  Diagnosis Date   Arthritis    Diabetes mellitus    Hypertension    Palpitations 06/10/2010   Pancreatitis    Pneumonia    renal ca 05/20/2022   Past Surgical History:  Procedure Laterality Date   APPENDECTOMY  1957   COLONOSCOPY  12/19/2018   per Dr. 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   TONSILLECTOMY     Family History  Problem Relation Age of Onset   Stroke Mother    Diabetes Mother    Osteoporosis Mother    Dementia Mother    Heart disease Mother        pacemaker   Hyperlipidemia Father        diet controlled   CAD Father        stents in 78s or 44s   Arthritis Sister    Hyperthyroidism Sister    Social History   Socioeconomic History   Marital status: Married    Spouse name: Not on file   Number of children: Not on file   Years of education: Not on file   Highest education level: Not on file  Occupational History   Not on file  Tobacco Use   Smoking status: Never   Smokeless tobacco: Never  Vaping Use   Vaping status: Never Used  Substance and Sexual Activity   Alcohol use: Yes    Alcohol/week: 1.0 - 7.0 standard drink of alcohol    Types: 1 - 7 Standard drinks or equivalent per week    Comment: occas.   Drug use: Yes    Comment: cbd ointment   Sexual activity: Not on file  Other Topics Concern   Not on file  Social History Narrative   Married. 1 daughter, 2 step sons. 8 grandkids. No greatgrandkids.       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: golf, bridge, travel, reading   Social Determinants of Corporate investment banker Strain: Low Risk  (07/26/2023)   Overall Financial Resource Strain (CARDIA)    Difficulty of Paying Living Expenses: Not hard at all  Food Insecurity: No Food Insecurity (07/26/2023)   Hunger Vital Sign    Worried About Running Out of Food in the Last Year: Never true    Ran Out of Food in the Last Year: Never true  Transportation Needs: No Transportation Needs (07/26/2023)   PRAPARE - Administrator, Civil Service (Medical): No     Lack of Transportation (Non-Medical): No  Physical Activity: Sufficiently Active (07/26/2023)   Exercise Vital Sign    Days of Exercise per Week: 4 days    Minutes of Exercise per Session: 50 min  Stress: No Stress Concern Present (07/26/2023)   [employer] of Occupational Health - Occupational Stress Questionnaire    Feeling of Stress : Not at all  Social Connections: Socially Isolated (07/26/2023)   Social Connection and Isolation Panel [NHANES]    Frequency of Communication with  Friends and Family: More than three times a week    Frequency of Social Gatherings with Friends and Family: More than three times a week    Attends Religious Services: Never    Database administrator or Organizations: No    Attends Banker Meetings: Never    Marital Status: Widowed    Tobacco Counseling Counseling given: Not Answered   Clinical Intake:  Pre-visit preparation completed: Yes  Pain : No/denies pain     BMI - recorded: 29.7 Nutritional Status: BMI 25 -29 Overweight Nutritional Risks: None Diabetes: Yes CBG done?: Yes (CBG 117 Per patient) CBG resulted in Enter/ Edit results?: Yes Did pt. bring in CBG monitor from home?: No  How often do you need to have someone help you when you read instructions, pamphlets, or other written materials from your doctor or pharmacy?: 1 - Never  Interpreter Needed?: No  Information entered by :: [facility]   Activities of Daily Living    07/26/2023    3:54 PM  In your present state of health, do you have any difficulty performing the following activities:  Hearing? 0  Vision? 0  Difficulty concentrating or making decisions? 0  Walking or climbing stairs? 0  Dressing or bathing? 0  Doing errands, shopping? 0  Preparing Food and eating ? N  Using the Toilet? N  In the past six months, have you accidently leaked urine? N  Do you have problems with loss of bowel control? N  Managing your Medications? N  Managing  your Finances? N  Housekeeping or managing your Housekeeping? N    Patient Care Team: Nelwyn Salisbury, MD as PCP - General (Family Medicine) Venancio Poisson, MD as Consulting Physician (Dermatology) Teryl Lucy, MD as Consulting Physician (Orthopedic Surgery)  Indicate any recent Medical Services you may have received from other than [facility] providers in the past year (date may be approximate).     Assessment:   This is a routine wellness examination for [place].  Hearing/Vision screen Hearing Screening - Comments:: Denies hearing difficulties   Vision Screening - Comments:: Wears rx glasses - up to date with routine eye exams with  Dr Dione Booze   Goals Addressed               This Visit's Progress     Maintain weight and exercise (pt-stated)        Find purpose in life       Depression Screen    07/26/2023    3:52 PM 11/19/2022    3:59 PM 08/07/2022    9:25 AM 07/09/2022    9:58 AM 12/01/2021    2:15 PM 07/08/2021    2:46 PM 07/08/2021    2:42 PM  PHQ 2/9 Scores  PHQ - 2 Score 0 1 0 0 0 0 0  PHQ- 9 Score  1 1  2       Fall Risk    07/26/2023    3:54 PM 12/08/2022    2:28 PM 11/19/2022    4:00 PM 08/07/2022    9:24 AM 07/09/2022   10:01 AM  Fall Risk   Falls in the past year? 0 0 0 0 0  Number falls in past yr: 0 0 0 0 0  Injury with Fall? 0 0 0 0 0  Risk for fall due to : No Fall Risks No Fall Risks No Fall Risks No Fall Risks No Fall Risks  Follow up Falls prevention discussed Falls evaluation completed  Falls evaluation completed Falls evaluation completed Falls prevention discussed    MEDICARE RISK AT HOME: Medicare Risk at Home Any stairs in or around the home?: Yes If so, are there any without handrails?: No Home free of loose throw rugs in walkways, pet beds, electrical cords, etc?: Yes Adequate lighting in your home to reduce risk of falls?: Yes Life alert?: Yes Use of a cane, walker or w/c?: No Grab bars in the bathroom?: Yes Shower chair or bench in  shower?: Yes Elevated toilet seat or a handicapped toilet?: Yes  TIMED UP AND GO:  Was the test performed?  No    Cognitive Function:        07/26/2023    3:56 PM 07/09/2022   10:02 AM  6CIT Screen  What Year? 0 points 0 points  What month? 0 points 0 points  What time? 0 points 0 points  Count back from 20 0 points 0 points  Months in reverse 0 points 0 points  Repeat phrase 0 points 0 points  Total Score 0 points 0 points    Immunizations Immunization History  Administered Date(s) Administered   Covid-19, Mrna,Vaccine(Spikevax)87yrs and older 08/25/2022   Fluad Quad(high Dose 65+) 07/28/2019, 07/31/2020   Fluad Trivalent(High Dose 65+) 07/20/2023   Influenza Split 08/10/2011, 08/29/2012   Influenza Whole 12/04/2009, 09/10/2010   Influenza, High Dose Seasonal PF 07/10/2016, 08/20/2017, 09/12/2018   Influenza,inj,Quad PF,6+ Mos 07/19/2013, 10/02/2014, 09/06/2015   Influenza-Unspecified 08/12/2021, 08/11/2022   PFIZER(Purple Top)SARS-COV-2 Vaccination 12/14/2019, 01/08/2020, 08/18/2020   Pfizer Covid-19 Vaccine Bivalent Booster 25yrs & up 07/23/2021   Pneumococcal Conjugate-13 11/27/2014   Pneumococcal Polysaccharide-23 12/04/2009   Td 11/09/2006   Tdap 12/30/2016   Zoster, Live 10/15/2011    TDAP status: Up to date  Flu Vaccine status: Up to date  Pneumococcal vaccine status: Up to date  Covid-19 vaccine status: Declined, Education has been provided regarding the importance of this vaccine but patient still declined. Advised may receive this vaccine at local pharmacy or Health Dept.or vaccine clinic. Aware to provide a copy of the vaccination record if obtained from local pharmacy or Health Dept. Verbalized acceptance and understanding.  Qualifies for Shingles Vaccine? Yes   Zostavax completed Yes   Shingrix Completed?: Yes  Screening Tests Health Maintenance  Topic Date Due   Diabetic kidney evaluation - Urine ACR  02/20/2015   COVID-19 Vaccine (6 - 2023-24  season) 07/11/2023   OPHTHALMOLOGY EXAM  08/26/2023   HEMOGLOBIN A1C  10/02/2023   Colonoscopy  12/20/2023   Diabetic kidney evaluation - eGFR measurement  03/07/2024   FOOT EXAM  03/31/2024   Medicare Annual Wellness (AWV)  07/25/2024   DTaP/Tdap/Td (3 - Td or Tdap) 12/30/2026   Pneumonia Vaccine 10+ Years old  Completed   INFLUENZA VACCINE  Completed   Zoster Vaccines- Shingrix  Completed   HPV VACCINES  Aged Out    Health Maintenance  Health Maintenance Due  Topic Date Due   Diabetic kidney evaluation - Urine ACR  02/20/2015   COVID-19 Vaccine (6 - 2023-24 season) 07/11/2023    Colorectal cancer screening: Type of screening: Colonoscopy. Completed 12/19/18. Repeat every 5 years   Additional Screening:    Vision Screening: Recommended annual ophthalmology exams for early detection of glaucoma and other disorders of the eye. Is the patient up to date with their annual eye exam?  Yes  Who is the provider or what is the name of the office in which the patient attends annual eye exams? Dr Dione Booze  If pt is not established with a provider, would they like to be referred to a provider to establish care? No .   Dental Screening: Recommended annual dental exams for proper oral hygiene  Diabetic Foot Exam: Diabetic Foot Exam: Completed 04/01/23  Community Resource Referral / Chronic Care Management:  CRR required this visit?  No   CCM required this visit?  No     Plan:     I have personally reviewed and noted the following in the patient's chart:   Medical and social history Use of alcohol, tobacco or illicit drugs  Current medications and supplements including opioid prescriptions. Patient is not currently taking opioid prescriptions. Functional ability and status Nutritional status Physical activity Advanced directives List of other physicians Hospitalizations, surgeries, and ER visits in previous 12 months Vitals Screenings to include cognitive, depression, and  falls Referrals and appointments  In addition, I have reviewed and discussed with patient certain preventive protocols, quality metrics, and best practice recommendations. A written personalized care plan for preventive services as well as general preventive health recommendations were provided to patient.     Tillie Rung, LPN   1/61/0960   After Visit Summary: (MyChart) Due to this being a telephonic visit, the after visit summary with patients personalized plan was offered to patient via MyChart   Nurse Notes: None

## 2023-07-26 NOTE — Patient Instructions (Addendum)
Walter Campbell , Thank you for taking time to come for your Medicare Wellness Visit. I appreciate your ongoing commitment to your health goals. Please review the following plan we discussed and let me know if I can assist you in the future.   Referrals/Orders/Follow-Ups/Clinician Recommendations:   This is a list of the screening recommended for you and due dates:  Health Maintenance  Topic Date Due   Yearly kidney health urinalysis for diabetes  02/20/2015   COVID-19 Vaccine (6 - 2023-24 season) 07/11/2023   Eye exam for diabetics  08/26/2023   Hemoglobin A1C  10/02/2023   Colon Cancer Screening  12/20/2023   Yearly kidney function blood test for diabetes  03/07/2024   Complete foot exam   03/31/2024   Medicare Annual Wellness Visit  07/25/2024   DTaP/Tdap/Td vaccine (3 - Td or Tdap) 12/30/2026   Pneumonia Vaccine  Completed   Flu Shot  Completed   Zoster (Shingles) Vaccine  Completed   HPV Vaccine  Aged Out    Advanced directives: (In Chart) A copy of your advanced directives are scanned into your chart should your provider ever need it.  Next Medicare Annual Wellness Visit scheduled for next year: Yes

## 2023-08-04 ENCOUNTER — Other Ambulatory Visit: Payer: Self-pay

## 2023-08-04 ENCOUNTER — Emergency Department (HOSPITAL_BASED_OUTPATIENT_CLINIC_OR_DEPARTMENT_OTHER)
Admission: EM | Admit: 2023-08-04 | Discharge: 2023-08-04 | Disposition: A | Discharge status: Home / Self Care | Payer: Medicare PPO | Attending: Emergency Medicine | Admitting: Emergency Medicine

## 2023-08-04 ENCOUNTER — Emergency Department (HOSPITAL_BASED_OUTPATIENT_CLINIC_OR_DEPARTMENT_OTHER): Payer: Medicare PPO | Admitting: Radiology

## 2023-08-04 ENCOUNTER — Encounter (HOSPITAL_BASED_OUTPATIENT_CLINIC_OR_DEPARTMENT_OTHER): Payer: Self-pay | Admitting: Emergency Medicine

## 2023-08-04 DIAGNOSIS — Z1152 Encounter for screening for COVID-19: Secondary | ICD-10-CM | POA: Diagnosis not present

## 2023-08-04 DIAGNOSIS — R42 Dizziness and giddiness: Secondary | ICD-10-CM | POA: Insufficient documentation

## 2023-08-04 DIAGNOSIS — Z79899 Other long term (current) drug therapy: Secondary | ICD-10-CM | POA: Insufficient documentation

## 2023-08-04 DIAGNOSIS — E119 Type 2 diabetes mellitus without complications: Secondary | ICD-10-CM | POA: Diagnosis not present

## 2023-08-04 DIAGNOSIS — I10 Essential (primary) hypertension: Secondary | ICD-10-CM | POA: Diagnosis not present

## 2023-08-04 DIAGNOSIS — K6389 Other specified diseases of intestine: Secondary | ICD-10-CM | POA: Diagnosis not present

## 2023-08-04 DIAGNOSIS — R0789 Other chest pain: Secondary | ICD-10-CM | POA: Insufficient documentation

## 2023-08-04 DIAGNOSIS — Z85528 Personal history of other malignant neoplasm of kidney: Secondary | ICD-10-CM | POA: Insufficient documentation

## 2023-08-04 DIAGNOSIS — Z7984 Long term (current) use of oral hypoglycemic drugs: Secondary | ICD-10-CM | POA: Diagnosis not present

## 2023-08-04 LAB — RESP PANEL BY RT-PCR (RSV, FLU A&B, COVID)  RVPGX2
Influenza A by PCR: NEGATIVE
Influenza B by PCR: NEGATIVE
Resp Syncytial Virus by PCR: NEGATIVE
SARS Coronavirus 2 by RT PCR: NEGATIVE

## 2023-08-04 LAB — CBC
HCT: 41.1 % (ref 39.0–52.0)
Hemoglobin: 13.8 g/dL (ref 13.0–17.0)
MCH: 29.7 pg (ref 26.0–34.0)
MCHC: 33.6 g/dL (ref 30.0–36.0)
MCV: 88.6 fL (ref 80.0–100.0)
Platelets: 266 10*3/uL (ref 150–400)
RBC: 4.64 MIL/uL (ref 4.22–5.81)
RDW: 13.3 % (ref 11.5–15.5)
WBC: 9 10*3/uL (ref 4.0–10.5)
nRBC: 0 % (ref 0.0–0.2)

## 2023-08-04 LAB — BASIC METABOLIC PANEL
Anion gap: 13 (ref 5–15)
BUN: 19 mg/dL (ref 8–23)
CO2: 23 mmol/L (ref 22–32)
Calcium: 9.1 mg/dL (ref 8.9–10.3)
Chloride: 104 mmol/L (ref 98–111)
Creatinine, Ser: 0.91 mg/dL (ref 0.61–1.24)
GFR, Estimated: >60 mL/min (ref >60)
Glucose, Bld: 123 mg/dL — ABNORMAL HIGH (ref 70–99)
Potassium: 4.2 mmol/L (ref 3.5–5.1)
Sodium: 140 mmol/L (ref 135–145)

## 2023-08-04 LAB — TROPONIN I (HIGH SENSITIVITY)
Troponin I (High Sensitivity): 7 ng/L (ref <18)
Troponin I (High Sensitivity): 7 ng/L (ref <18)

## 2023-08-04 NOTE — ED Notes (Signed)
Pt discharged to home using teachback Method. Discharge instructions have been discussed with patient and/or family members. Pt verbally acknowledges understanding d/c instructions, has been given opportunity for questions to be answered, and endorses comprehension to checkout at registration before leaving.  

## 2023-08-04 NOTE — ED Provider Notes (Signed)
[place] EMERGENCY DEPARTMENT AT [facility] Provider Note   CSN: [id number] Arrival date & time: 08/04/23  1346     History {Add pertinent medical, surgical, social history, OB history to HPI:1} Chief Complaint  Patient presents with   Hypertension    Walter Campbell is a 82 y.o. male.  He has a history of renal cancer, pancreatitis, hypertension, diabetes.  He said he was golfing today when he felt like his head was fuzzy.  He ultimately stopped early and went home and checked his blood pressure and found it to be elevated along with an elevated heart rate up to 106.  He wanted to come here to get checked out make sure there was not anything serious going on.  He said he feels a little bit better although still feels a little funny in his head.  No blurry vision double vision numbness weakness nausea vomiting chest pain shortness of breath.  No difficulty with balance.  Has been taking his medications regularly.  Said his blood pressure is usually 130s over 80s.  The history is provided by the patient.  Hypertension This is a new problem. The current episode started 3 to 5 hours ago. The problem has been gradually improving. Pertinent negatives include no chest pain, no abdominal pain, no headaches and no shortness of breath. Nothing aggravates the symptoms. Nothing relieves the symptoms. He has tried nothing for the symptoms. The treatment provided mild relief.       Home Medications Prior to Admission medications   Medication Sig Start Date End Date Taking? Authorizing Provider  atorvastatin (LIPITOR) 20 MG tablet TAKE 1 TABLET BY MOUTH ON MONDAYS, [facility] AND FRIDAYS 12/03/22   Nelwyn Salisbury, MD  benazepril-hydrochlorthiazide (LOTENSIN HCT) 20-25 MG tablet Take 1 tablet by mouth daily. 12/03/22   Nelwyn Salisbury, MD  colestipol (COLESTID) 1 g tablet Take 1 g by mouth 2 (two) times daily. 11/19/22   [provider]  Continuous Blood Gluc Sensor (FREESTYLE LIBRE  2 SENSOR) MISC CHANGE EVERY 14 DAYS 01/11/23   Shamleffer, Konrad Dolores, MD  CREON [phone] units CPEP capsule Take 1-2 capsules by mouth See admin instructions. Take 2 capsule with each meal and 1 capsule with a snack 02/03/22   [provider]  felodipine (PLENDIL) 5 MG 24 hr tablet Take 1 tablet (5 mg total) by mouth daily. 12/03/22   Nelwyn Salisbury, MD  glipiZIDE (GLUCOTROL) 5 MG tablet Take 2 tablets (10 mg total) by mouth daily before breakfast. 04/01/23   Shamleffer, Konrad Dolores, MD  loratadine (CLARITIN) 10 MG tablet Take 10 mg by mouth daily as needed for allergies.    [provider]  metFORMIN (GLUCOPHAGE-XR) 500 MG 24 hr tablet Take 2 tablets (1,000 mg total) by mouth 2 (two) times daily with a meal. 04/01/23   Shamleffer, Konrad Dolores, MD  OVER THE COUNTER MEDICATION Apply 1 Application topically daily as needed (pain). CBD cream    [provider]  potassium chloride (KLOR-CON M) 10 MEQ tablet Take 1 tablet (10 mEq total) by mouth 2 (two) times daily. 12/11/22   Nelwyn Salisbury, MD  tamsulosin (FLOMAX) 0.4 MG CAPS capsule Take 1 capsule (0.4 mg total) by mouth daily. 12/03/22   Nelwyn Salisbury, MD  triamcinolone cream (KENALOG) 0.1 % Apply 1 Application topically 2 (two) times daily. 11/19/22   Philip Aspen, Limmie Patricia, MD      Allergies    Patient has no known allergies.  Review of Systems   Review of Systems  Constitutional:  Negative for fever.  Eyes:  Negative for visual disturbance.  Respiratory:  Negative for shortness of breath.   Cardiovascular:  Negative for chest pain.  Gastrointestinal:  Negative for abdominal pain.  Genitourinary:  Negative for dysuria.  Neurological:  Positive for dizziness and light-headedness. Negative for syncope, speech difficulty, weakness and headaches.    Physical Exam Updated Vital Signs BP 139/82 (BP Location: Right Arm)   Pulse 99   Temp 98.6 F (37 C)   Resp 17   SpO2 94%  Physical Exam Vitals  and nursing note reviewed.  Constitutional:      General: He is not in acute distress.    Appearance: Normal appearance. He is well-developed.  HENT:     Head: Normocephalic and atraumatic.  Eyes:     Conjunctiva/sclera: Conjunctivae normal.  Cardiovascular:     Rate and Rhythm: Normal rate and regular rhythm.     Heart sounds: No murmur heard. Pulmonary:     Effort: Pulmonary effort is normal. No respiratory distress.     Breath sounds: Normal breath sounds.  Abdominal:     Palpations: Abdomen is soft.     Tenderness: There is no abdominal tenderness.  Musculoskeletal:        General: No deformity.     Cervical back: Neck supple.  Skin:    General: Skin is warm and dry.     Capillary Refill: Capillary refill takes less than 2 seconds.  Neurological:     General: No focal deficit present.     Mental Status: He is alert and oriented to person, place, and time.     Cranial Nerves: No cranial nerve deficit.     Sensory: No sensory deficit.     Motor: No weakness.     ED Results / Procedures / Treatments   Labs (all labs ordered are listed, but only abnormal results are displayed) Labs Reviewed  BASIC METABOLIC PANEL - Abnormal; Notable for the following components:      Result Value   Glucose, Bld 123 (*)    All other components within normal limits  RESP PANEL BY RT-PCR (RSV, FLU A&B, COVID)  [id number]  CBC  TROPONIN I (HIGH SENSITIVITY)  TROPONIN I (HIGH SENSITIVITY)    EKG EKG Interpretation Date/Time:  Wednesday August 04 2023 14:00:47 EDT Ventricular Rate:  96 PR Interval:  220 QRS Duration:  114 QT Interval:  376 QTC Calculation: 475 R Axis:   -16  Text Interpretation: Sinus rhythm with 1st degree A-V block with Premature atrial complexes Incomplete right bundle branch block Borderline ECG Confirmed by Zadie Rhine ([id number]) on 08/04/2023 2:08:32 PM  Radiology No results found.  Procedures Procedures  {Document cardiac monitor, telemetry assessment  procedure when appropriate:1}  Medications Ordered in ED Medications - No data to display  ED Course/ Medical Decision Making/ A&P Clinical Course as of 08/04/23 1609  Wed Aug 04, 2023  1552 Patient not in room [MB]    Clinical Course User Index [MB] Terrilee Files, MD   {   Click here for ABCD2, HEART and other calculatorsREFRESH Note before signing :1}                              Medical Decision Making Amount and/or Complexity of Data Reviewed Labs: ordered. Radiology: ordered.   This patient complains of ***; this involves an extensive number of treatment  Options and is a complaint that carries with it a high risk of complications and morbidity. The differential includes ***  I ordered, reviewed and interpreted labs, which included *** I ordered medication *** and reviewed PMP when indicated. I ordered imaging studies which included *** and I independently    visualized and interpreted imaging which showed *** Additional history obtained from *** Previous records obtained and reviewed *** I consulted *** and discussed lab and imaging findings and discussed disposition.  Cardiac monitoring reviewed, *** Social determinants considered, *** Critical Interventions: ***  After the interventions stated above, I reevaluated the patient and found *** Admission and further testing considered, ***   {Document critical care time when appropriate:1} {Document review of labs and clinical decision tools ie heart score, Chads2Vasc2 etc:1}  {Document your independent review of radiology images, and any outside records:1} {Document your discussion with family members, caretakers, and with consultants:1} {Document social determinants of health affecting pt's care:1} {Document your decision making why or why not admission, treatments were needed:1} Final Clinical Impression(s) / ED Diagnoses Final diagnoses:  None    Rx / DC Orders ED Discharge Orders     None

## 2023-08-04 NOTE — ED Notes (Signed)
ED Provider at bedside. 

## 2023-08-04 NOTE — ED Triage Notes (Signed)
Started feeling woozy after golfing today. BP 200/something at home Just wants to get it all checked out Denies cp, no sob

## 2023-08-04 NOTE — ED Notes (Signed)
ED provider at bedside.

## 2023-08-04 NOTE — Discharge Instructions (Signed)
You were seen in the emergency department for feeling unusual in your head.  You had blood work EKG and chest x-ray that did not show any significant abnormalities.  Please continue your regular medications and monitor your symptoms at home.  Follow-up with your primary care doctor.  Return to the emergency department if any worsening or concerning symptoms.

## 2023-08-05 ENCOUNTER — Ambulatory Visit (HOSPITAL_COMMUNITY)
Admission: RE | Admit: 2023-08-05 | Discharge: 2023-08-05 | Disposition: A | Discharge status: Home / Self Care | Payer: Medicare PPO | Source: Ambulatory Visit | Attending: Cardiology | Admitting: Cardiology

## 2023-08-05 DIAGNOSIS — I779 Disorder of arteries and arterioles, unspecified: Secondary | ICD-10-CM | POA: Insufficient documentation

## 2023-08-06 ENCOUNTER — Ambulatory Visit: Payer: Medicare PPO | Admitting: Family Medicine

## 2023-08-06 ENCOUNTER — Encounter: Payer: Self-pay | Admitting: Family Medicine

## 2023-08-06 VITALS — BP 118/64 | HR 76 | Temp 97.4°F | Wt 187.0 lb

## 2023-08-06 DIAGNOSIS — I10 Essential (primary) hypertension: Secondary | ICD-10-CM | POA: Diagnosis not present

## 2023-08-06 DIAGNOSIS — R42 Dizziness and giddiness: Secondary | ICD-10-CM | POA: Diagnosis not present

## 2023-08-06 NOTE — Progress Notes (Signed)
Subjective:    Patient ID: Walter Campbell, male    DOB: 02/16/1941, 82 y.o.   MRN: [id number]  HPI Here to follow up on an ED visit on 08-04-23. That morning he was playing golf when he began to feel a little "fuzzy" in his head. He was able to finish and drive home. After getting home however the dizziness got a little worse and he developed a mild headache. No chest pain or SOB. He took his BP and it was 200/110 (he normally stays in the 130's over 80's). He then went to the ED, and on arrival the BP was 150/80. His exam was normal. His EKG showed no changes from baseline. His labs and CXR were all normal. By the end of his workup his BP was down to 139/82. He was sent home and told to follow up with [place]. No medications were changed. Since then he has felt fine and his BP has been well controlled. He says he did nothing out of the ordinary that morning, no extra caffeine use, etc.    Review of Systems  Constitutional: Negative.   Respiratory: Negative.    Cardiovascular: Negative.   Gastrointestinal: Negative.   Genitourinary: Negative.   Neurological: Negative.        Objective:   Physical Exam Constitutional:      Appearance: Normal appearance. He is not ill-appearing.  Cardiovascular:     Rate and Rhythm: Normal rate and regular rhythm.     Pulses: Normal pulses.     Heart sounds: Normal heart sounds.  Pulmonary:     Effort: Pulmonary effort is normal.     Breath sounds: Normal breath sounds.  Neurological:     General: No focal deficit present.     Mental Status: He is alert and oriented to person, place, and time.           Assessment & Plan:  Transient high BP with dizziness. The etiology of this remains unclear. We agreed to keep his medications the same. He will check the BP several times a day, and he will report back to [place] next week. We will also set up a contrasted head CT for next week. We spent a total of (33   ) minutes reviewing records and discussing these  issues.  Gershon Crane, MD

## 2023-08-06 NOTE — Telephone Encounter (Addendum)
Walter Campbell with [facility] is requesting revised order for CT Head - Must say "with" &/or "without" contrast . [id number]  Please place revised order in Epic.

## 2023-08-09 NOTE — Addendum Note (Signed)
Addended by: Gershon Crane A on: 08/09/2023 05:36 PM   Modules accepted: Orders

## 2023-08-09 NOTE — Telephone Encounter (Signed)
I put in the new order as described

## 2023-08-10 ENCOUNTER — Encounter: Payer: Self-pay | Admitting: Family Medicine

## 2023-08-10 NOTE — Telephone Encounter (Signed)
Spoke with [facility] verbalized that the new referral was received

## 2023-08-23 ENCOUNTER — Ambulatory Visit
Admission: RE | Admit: 2023-08-23 | Discharge: 2023-08-23 | Disposition: A | Discharge status: Home / Self Care | Payer: Medicare PPO | Source: Ambulatory Visit | Attending: Family Medicine | Admitting: Family Medicine

## 2023-08-23 DIAGNOSIS — R42 Dizziness and giddiness: Secondary | ICD-10-CM

## 2023-08-30 DIAGNOSIS — E119 Type 2 diabetes mellitus without complications: Secondary | ICD-10-CM | POA: Diagnosis not present

## 2023-08-30 DIAGNOSIS — Z961 Presence of intraocular lens: Secondary | ICD-10-CM | POA: Diagnosis not present

## 2023-08-30 DIAGNOSIS — H0102A Squamous blepharitis right eye, upper and lower eyelids: Secondary | ICD-10-CM | POA: Diagnosis not present

## 2023-08-30 DIAGNOSIS — H0102B Squamous blepharitis left eye, upper and lower eyelids: Secondary | ICD-10-CM | POA: Diagnosis not present

## 2023-08-30 DIAGNOSIS — H353131 Nonexudative age-related macular degeneration, bilateral, early dry stage: Secondary | ICD-10-CM | POA: Diagnosis not present

## 2023-08-30 DIAGNOSIS — H43813 Vitreous degeneration, bilateral: Secondary | ICD-10-CM | POA: Diagnosis not present

## 2023-08-30 LAB — HM DIABETES EYE EXAM

## 2023-09-02 ENCOUNTER — Telehealth: Payer: Self-pay | Admitting: Family Medicine

## 2023-09-02 NOTE — Telephone Encounter (Signed)
Pt is calling and would like ct scan of head result

## 2023-09-03 NOTE — Telephone Encounter (Signed)
Spoke with pt verbalized understanding that CT results are not back yet, pt is aware that the office will call him when they are back

## 2023-09-07 ENCOUNTER — Telehealth: Payer: Self-pay

## 2023-09-07 NOTE — Telephone Encounter (Signed)
Transition Care Management Follow-up Telephone Call Date of discharge and from where: 08/04/2023 [facility] How have you been since you were released from the hospital? Patient stated he is feeling better. Any questions or concerns? No  Items Reviewed: Did the pt receive and understand the discharge instructions provided? Yes  Medications obtained and verified?  No medication prescribed. Other? No  Any new allergies since your discharge? No  Dietary orders reviewed? Yes Do you have support at home? Yes   Follow up appointments reviewed:  PCP Hospital f/u appt confirmed? Yes  Scheduled to see Jeannett Senior A. Clent Ridges, MD on 08/06/2023 @ [facility] at [place]. Specialist Hospital f/u appt confirmed? No  Scheduled to see  on  @ . Are transportation arrangements needed? No  If their condition worsens, is the pt aware to call PCP or go to the Emergency Dept.? Yes Was the patient provided with contact information for the PCP's office or ED? Yes Was to pt encouraged to call back with questions or concerns? Yes   Mose Colaizzi [facility]  [facility], [facility] Guide Direct Dial: [phone]  Website: [email or web address]

## 2023-09-16 ENCOUNTER — Other Ambulatory Visit: Payer: Self-pay | Admitting: Internal Medicine

## 2023-09-30 DIAGNOSIS — L570 Actinic keratosis: Secondary | ICD-10-CM | POA: Diagnosis not present

## 2023-10-13 DIAGNOSIS — K219 Gastro-esophageal reflux disease without esophagitis: Secondary | ICD-10-CM | POA: Diagnosis not present

## 2023-10-13 DIAGNOSIS — H3530 Unspecified macular degeneration: Secondary | ICD-10-CM | POA: Diagnosis not present

## 2023-10-13 DIAGNOSIS — E785 Hyperlipidemia, unspecified: Secondary | ICD-10-CM | POA: Diagnosis not present

## 2023-10-13 DIAGNOSIS — I10 Essential (primary) hypertension: Secondary | ICD-10-CM | POA: Diagnosis not present

## 2023-10-13 DIAGNOSIS — N400 Enlarged prostate without lower urinary tract symptoms: Secondary | ICD-10-CM | POA: Diagnosis not present

## 2023-10-13 DIAGNOSIS — M4800 Spinal stenosis, site unspecified: Secondary | ICD-10-CM | POA: Diagnosis not present

## 2023-10-13 DIAGNOSIS — F411 Generalized anxiety disorder: Secondary | ICD-10-CM | POA: Diagnosis not present

## 2023-10-13 DIAGNOSIS — E1136 Type 2 diabetes mellitus with diabetic cataract: Secondary | ICD-10-CM | POA: Diagnosis not present

## 2023-10-13 DIAGNOSIS — M1990 Unspecified osteoarthritis, unspecified site: Secondary | ICD-10-CM | POA: Diagnosis not present

## 2023-10-18 ENCOUNTER — Encounter: Payer: Self-pay | Admitting: Internal Medicine

## 2023-10-18 ENCOUNTER — Ambulatory Visit: Payer: Medicare PPO | Admitting: Internal Medicine

## 2023-10-18 VITALS — BP 122/74 | HR 77 | Ht 66.0 in | Wt 185.0 lb

## 2023-10-18 DIAGNOSIS — E119 Type 2 diabetes mellitus without complications: Secondary | ICD-10-CM | POA: Diagnosis not present

## 2023-10-18 DIAGNOSIS — Z7984 Long term (current) use of oral hypoglycemic drugs: Secondary | ICD-10-CM | POA: Diagnosis not present

## 2023-10-18 LAB — POCT GLYCOSYLATED HEMOGLOBIN (HGB A1C): Hemoglobin A1C: 6.6 % — AB (ref 4.0–5.6)

## 2023-10-18 MED ORDER — REPAGLINIDE 0.5 MG PO TABS: 0.5000 mg | ORAL_TABLET | Freq: Two times a day (BID) | ORAL | 3 refills | Status: DC

## 2023-10-18 NOTE — Patient Instructions (Signed)
-   Stop Glipizide 5 mg  - Start Repaglinide 0.5 mg , 1 tablet before Breakfast , if sugars are high ( over 200) after supper, please take 1 tablet before Supper as well  - Continue Metformin 500 mg, 2 tablet before Breakfast and 2 tablet before Supper     HOW TO TREAT LOW BLOOD SUGARS (Blood sugar LESS THAN 70 MG/DL) Please follow the RULE OF 15 for the treatment of hypoglycemia treatment (when your (blood sugars are less than 70 mg/dL)   STEP 1: Take 15 grams of carbohydrates when your blood sugar is low, which includes:  3-4 GLUCOSE TABS  OR 3-4 OZ OF JUICE OR REGULAR SODA OR ONE TUBE OF GLUCOSE GEL    STEP 2: RECHECK blood sugar in 15 MINUTES STEP 3: If your blood sugar is still low at the 15 minute recheck --> then, go back to STEP 1 and treat AGAIN with another 15 grams of carbohydes

## 2023-10-18 NOTE — Progress Notes (Signed)
Name: Walter Campbell  Age/ Sex: 82 y.o., male   MRN/ DOB: [id number], [birth year]     PCP: Nelwyn Salisbury, MD   Reason for Endocrinology Evaluation: Type 2 Diabetes Mellitus  Initial Endocrine Consultative Visit: 03/10/2021    PATIENT IDENTIFIER: [name] is a 82 y.o. male with a past medical history of T2Dm, HTN and dyslipidemia and Hx of pancreatitis  . The patient has followed with Endocrinology clinic since 03/10/2021 for consultative assistance with management of his diabetes.  DIABETIC HISTORY:  [name] was diagnosed with DM in 2010, januvia has been ineffective.Marland Kitchen His hemoglobin A1c has ranged from 6.7% in 2021, peaking at 7.6% in 2022.   On his initial visit to our clinic he had an A1c 7.6% , we decreased Glipizide due to hypoglycemia and continued Metformin   SUBJECTIVE:   During the last visit (04/01/2023): A1c 6.6 %     Today (10/18/2023): [name] is here for a follow up on diabetes management . He checks his blood sugars multiple times daily, through CGM. The patient has had hypoglycemic episodes since the last clinic visit. He is rarely symptomatic with these episodes. 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 On Creon - follows with GI  Denies nausea or vomiting  Has chronic loose stools due to hx of pancreatitis , which is well controlled with imodium    HOME DIABETES REGIMEN:  Glipizide 5 mg ,2 tabs before breakfast- takes 1 tablet  Metformin 500 mg, 2 tablet before Breakfast and 2 tablet before Supper      Statin: yes ACE-I/ARB: yes Prior Diabetic Education: yes    CONTINUOUS GLUCOSE MONITORING RECORD INTERPRETATION    Dates of Recording:11/25-12/06/2023  Sensor description: freestyle libre 2  Results statistics:   CGM use % of time 50  Average and SD 140/26.1  Time in range 85 %  % Time Above 180 14  % Time above 250 1  % Time Below target 0    Glycemic patterns summary: BGs have  been noted to be optimal overnight and in between the meals  Hyperglycemic episodes  post prandial   Hypoglycemic episodes occurred N/A  Overnight periods: Stable      DIABETIC COMPLICATIONS: Microvascular complications:   Denies: CKD, retinopathy, neuropathy Last Eye Exam: Completed 11/2021  Macrovascular complications:   Denies: CAD, CVA, PVD   HISTORY:  Past Medical History:  Past Medical History:  Diagnosis Date   Arthritis    Diabetes mellitus    Hypertension    Palpitations 06/10/2010   Pancreatitis    Pneumonia    renal ca 05/20/2022   Past Surgical History:  Past Surgical History:  Procedure Laterality Date   APPENDECTOMY  1957   COLONOSCOPY  12/19/2018   per Dr. 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 He has never  used smokeless tobacco. He reports current alcohol use of about 1.0 - 7.0 standard drink of alcohol per week. He reports current drug use. Family History:  Family History  Problem Relation Age of Onset   Stroke Mother    Diabetes Mother    Osteoporosis Mother    Dementia Mother    Heart disease Mother        pacemaker   Hyperlipidemia Father        diet controlled   CAD Father        stents in 42s or 27s   Arthritis Sister    Hyperthyroidism Sister      HOME MEDICATIONS: Allergies as of 10/18/2023   No Known Allergies      Medication List        Accurate as of October 18, 2023  9:34 AM. If you have any questions, ask  your nurse or doctor.          atorvastatin 20 MG tablet Commonly known as: LIPITOR TAKE 1 TABLET BY MOUTH ON MONDAYS, WEDNESDAYS AND FRIDAYS   benazepril-hydrochlorthiazide 20-25 MG tablet Commonly known as: LOTENSIN HCT Take 1 tablet by mouth daily.   Creon 36000-114000 units Cpep capsule Generic drug: lipase/protease/amylase Take 1-2 capsules by mouth See admin instructions. Take 2 capsule with each meal and 1 capsule with a snack   felodipine 5 MG 24 hr tablet Commonly known as: PLENDIL Take 1 tablet (5 mg total) by mouth daily.   FreeStyle Libre 2 Sensor Misc CHANGE EVERY 14 DAYS   glipiZIDE 5 MG tablet Commonly known as: GLUCOTROL Take 2 tablets (10 mg total) by mouth daily before breakfast.   loratadine 10 MG tablet Commonly known as: CLARITIN Take 10 mg by mouth daily as needed for allergies.   metFORMIN 500 MG 24 hr tablet Commonly known as: GLUCOPHAGE-XR Take 2 tablets (1,000 mg total) by mouth 2 (two) times daily with a meal.   OVER THE COUNTER MEDICATION Apply 1 Application topically daily as needed (pain). CBD cream   tamsulosin 0.4 MG Caps capsule Commonly known as: FLOMAX Take 1 capsule (0.4 mg total) by mouth daily.         OBJECTIVE:   Vital Signs: BP 122/74 (BP Location: Left Arm, Patient Position: Sitting, Cuff Size: Small)   Pulse 77   Ht 5\' 6"  (1.676 m)   Wt 185 lb (83.9 kg)   SpO2 99%   BMI 29.86 kg/m   Wt Readings from Last 3 Encounters:  10/18/23 185 lb (83.9 kg)  08/06/23 187 lb (84.8 kg)  07/26/23 184 lb (83.5 kg)     Exam: General: Pt appears well and is in NAD  Lungs: Clear with good BS bilat   Heart: RRR   Extremities: No  pretibial edema.   Neuro: MS is good with appropriate affect, pt is alert and Ox3       DM foot exam: 10/18/2023   The skin of the feet is intact without sores or ulcerations. The pedal pulses are 2+ on right and 2+ on left. The sensation is intact to a screening 5.07, 10 gram monofilament  bilaterally   DATA REVIEWED:  Lab Results  Component Value Date   HGBA1C 6.6 (A) 04/01/2023   HGBA1C 6.6 (A) 09/30/2022   HGBA1C 6.6 (H) 05/14/2022    Latest Reference Range & Units 08/04/23 14:03  Sodium 135 - 145 mmol/L 140  Potassium 3.5 - 5.1 mmol/L 4.2  Chloride 98 - 111 mmol/L 104  CO2 22 -  32 mmol/L 23  Glucose 70 - 99 mg/dL 147 (H)  BUN 8 - 23 mg/dL 19  Creatinine 8.29 - 5.62 mg/dL 1.30  Calcium 8.9 - 86.5 mg/dL 9.1  Anion gap 5 - 15  13  GFR, Estimated >60 mL/min >60  (H): Data is abnormally high  ASSESSMENT / PLAN / RECOMMENDATIONS:   1) Type 2 Diabetes Mellitus, Optimally controlled, Without complications - Most recent A1c of 6.6%. Goal A1c < 7.0 %.     -A1c remains stable and at goal, but he has been noted with hypoglycemia in the afternoons, despite self decreasing glipizide from 2 tablets before breakfast to 1 tablet before breakfast. -I have recommended switching glipizide to repaglinide, patient to take this before breakfast, but if he notices postprandial hyperglycemia >180 , then he may start to take it before supper as well - Will avoid GLp-1 agonists and DPP-4 inhibitors due to history of pancreatitis   MEDICATIONS: Stop glipizide 5 mg  Start repaglinide 0.5 mg, 1 tablet before breakfast Continue Metformin 500 mg, 2 tablet before Breakfast and 2 tablet before Supper    EDUCATION / INSTRUCTIONS: BG monitoring instructions: Patient is instructed to check his blood sugars 3 times a day, before meals . Call [facility] if: BG persistently < 70  I reviewed the Rule of 15 for the treatment of hypoglycemia in detail with the patient. Literature supplied.     2) Diabetic complications:  Eye: Does not have known diabetic retinopathy.  Neuro/ Feet: Does not have known diabetic peripheral neuropathy .  Renal: Patient does not have known baseline CKD. He   is  on an ACEI/ARB at present.      F/U in 4 months       Signed  electronically by: Lyndle Herrlich, MD  [facility]  [facility] [address] [place] [place], [place] [address] Phone: [phone] FAX: [phone]   CC: Nelwyn Salisbury, MD [address] [place] [address] Phone: [phone]  Fax: [phone]  Return to Endocrinology clinic as below: Future Appointments  Date Time Provider Department Center  07/28/2024  3:45 PM [facility]-ANNUAL WELLNESS VISIT [facility]

## 2023-11-11 DIAGNOSIS — N401 Enlarged prostate with lower urinary tract symptoms: Secondary | ICD-10-CM | POA: Diagnosis not present

## 2023-11-11 DIAGNOSIS — D49511 Neoplasm of unspecified behavior of right kidney: Secondary | ICD-10-CM | POA: Diagnosis not present

## 2023-11-11 DIAGNOSIS — R351 Nocturia: Secondary | ICD-10-CM | POA: Diagnosis not present

## 2023-12-22 ENCOUNTER — Other Ambulatory Visit: Payer: Self-pay | Admitting: Family Medicine

## 2023-12-22 DIAGNOSIS — I10 Essential (primary) hypertension: Secondary | ICD-10-CM

## 2023-12-23 ENCOUNTER — Other Ambulatory Visit: Payer: Self-pay | Admitting: Family Medicine

## 2024-01-06 DIAGNOSIS — L308 Other specified dermatitis: Secondary | ICD-10-CM | POA: Diagnosis not present

## 2024-01-06 DIAGNOSIS — L570 Actinic keratosis: Secondary | ICD-10-CM | POA: Diagnosis not present

## 2024-01-13 DIAGNOSIS — K529 Noninfective gastroenteritis and colitis, unspecified: Secondary | ICD-10-CM | POA: Diagnosis not present

## 2024-02-16 ENCOUNTER — Encounter: Payer: Self-pay | Admitting: Internal Medicine

## 2024-02-16 ENCOUNTER — Ambulatory Visit: Admitting: Internal Medicine

## 2024-02-16 VITALS — BP 118/68 | HR 66 | Ht 66.0 in | Wt 183.8 lb

## 2024-02-16 DIAGNOSIS — E119 Type 2 diabetes mellitus without complications: Secondary | ICD-10-CM | POA: Diagnosis not present

## 2024-02-16 DIAGNOSIS — Z7984 Long term (current) use of oral hypoglycemic drugs: Secondary | ICD-10-CM | POA: Diagnosis not present

## 2024-02-16 LAB — POCT GLYCOSYLATED HEMOGLOBIN (HGB A1C): Hemoglobin A1C: 6.5 % — AB (ref 4.0–5.6)

## 2024-02-16 MED ORDER — REPAGLINIDE 1 MG PO TABS: 1.0000 mg | ORAL_TABLET | Freq: Three times a day (TID) | ORAL | 11 refills | Status: DC

## 2024-02-16 NOTE — Progress Notes (Signed)
 Name: Walter Campbell  Age/ Sex: 83 y.o., male   MRN/ DOB: [id number], 03-22-1941     PCP: Nelwyn Salisbury, MD   Reason for Endocrinology Evaluation: Type 2 Diabetes Mellitus  Initial Endocrine Consultative Visit: 03/10/2021    PATIENT IDENTIFIER: Walter Campbell is a 83 y.o. male with a past medical history of T2Dm, HTN and dyslipidemia and Hx of pancreatitis  . The patient has followed with Endocrinology clinic since 03/10/2021 for consultative assistance with management of his diabetes.  DIABETIC HISTORY:  Walter Campbell was diagnosed with DM in 2010, januvia has been ineffective.Marland Kitchen His hemoglobin A1c has ranged from 6.7% in 2021, peaking at 7.6% in 2022.   On his initial visit to our clinic he had an A1c 7.6% , we decreased Glipizide due to hypoglycemia and continued Metformin   Switched glipizide to repaglinide due to hypoglycemia  SUBJECTIVE:   During the last visit (10/18/2023): A1c 6.6 %     Today (02/16/2024): Walter Campbell is here for a follow up on diabetes management . He checks his blood sugars multiple times daily, through CGM. The patient has had hypoglycemic episodes since the last clinic visit. He is rarely symptomatic with these episodes. 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  Denies nausea or vomiting Has no fever but has allergiy issues  The patient self discontinue repaglinide and restarted back on glipizide   HOME DIABETES REGIMEN:  Metformin 500 mg, 2 tablet before Breakfast and 2 tablet before Supper  Repaglinide 0.5 mg, 1 tablet before breakfast    Statin: yes ACE-I/ARB: yes Prior Diabetic Education: yes    CONTINUOUS GLUCOSE MONITORING RECORD INTERPRETATION    Dates of Recording: 3/27-02/16/2024  Sensor description: freestyle libre 2  Results statistics:   CGM use % of time 46  Average and SD 134/30.6   Time in range 84%  % Time Above 180 15  % Time above 250 1  % Time Below target 0    Glycemic patterns summary: BGs are optimal throughout the night and in between meals Hyperglycemic episodes  post prandial   Hypoglycemic episodes occurred overnight and during the day  Overnight periods: Trends down    DIABETIC COMPLICATIONS: Microvascular complications:   Denies: CKD, retinopathy, neuropathy Last Eye Exam: Completed 11/2021  Macrovascular complications:   Denies: CAD, CVA, PVD   HISTORY:  Past Medical History:  Past Medical History:  Diagnosis Date   Arthritis    Diabetes mellitus    Hypertension    Palpitations 06/10/2010   Pancreatitis    Pneumonia    renal ca 05/20/2022   Past Surgical History:  Past Surgical History:  Procedure Laterality Date   APPENDECTOMY  1957   COLONOSCOPY  12/19/2018   per Dr. 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 He has never used smokeless tobacco. He reports current alcohol use of about 1.0 - 7.0 standard drink of alcohol per week. He reports current drug use. Family History:  Family History  Problem Relation Age of Onset   Stroke Mother    Diabetes Mother    Osteoporosis Mother    Dementia Mother    Heart disease Mother        pacemaker   Hyperlipidemia Father        diet controlled   CAD Father        stents in 2s or 47s   Arthritis Sister    Hyperthyroidism Sister      HOME MEDICATIONS: Allergies as  of 02/16/2024   No Known Allergies      Medication List        Accurate as of February 16, 2024  9:18 AM. If you have any questions, ask your nurse or doctor.          atorvastatin 20 MG tablet Commonly known as: LIPITOR TAKE 1 TABLET BY MOUTH ON MONDAYS, WEDNESDAYS AND FRIDAYS   benazepril-hydrochlorthiazide 20-25 MG tablet Commonly known as: LOTENSIN HCT TAKE 1 TABLET BY MOUTH EVERY DAY What changed: Another medication with the same name was removed. Continue taking this medication, and follow the directions you see here. Changed by: Johnney Ou Daryana Whirley   colestipol 1 g tablet Commonly known as: COLESTID 2 tablets Orally Once a day for 90 days   Creon 36000-114000 units Cpep capsule Generic drug: lipase/protease/amylase Take 1-2 capsules by mouth See admin instructions. Take 2 capsule with each meal and 1 capsule with a snack   Feldene 10 MG capsule Generic drug: piroxicam 5 mg 1 capsule with food Orally Once a day   felodipine 5 MG 24 hr tablet Commonly known as: PLENDIL TAKE 1 TABLET (5 MG TOTAL) BY MOUTH DAILY.   FreeStyle Libre 2 Sensor Misc CHANGE EVERY 14 DAYS   Imodium A-D 2 MG tablet Generic drug: loperamide 1-3 tablets Orally in the morning   loratadine 10 MG tablet Commonly known as: CLARITIN Take 10 mg by mouth daily as needed for allergies.   metFORMIN 500 MG 24 hr tablet Commonly known as: GLUCOPHAGE-XR Take 2 tablets (1,000 mg total) by mouth 2 (two) times daily with a meal.   OVER THE COUNTER MEDICATION Apply 1 Application topically daily as needed (pain). CBD cream   repaglinide 0.5 MG tablet Commonly known as: PRANDIN Take 1 tablet (0.5 mg total) by mouth 2 (two) times daily before a meal.   tamsulosin 0.4 MG Caps capsule Commonly known as: FLOMAX Take 1 capsule (0.4 mg total) by mouth daily.   traMADol 50 MG tablet Commonly known as: ULTRAM Take 50 mg by mouth every 4 (four) hours as needed.         OBJECTIVE:   Vital Signs:  BP 118/68 (BP Location: Left Arm, Patient Position: Sitting, Cuff Size: Small)   Pulse 66   Ht 5\' 6"  (1.676 m)   Wt 183 lb 12.8 oz (83.4 kg)   SpO2 99%   BMI 29.67 kg/m   Wt Readings from Last 3 Encounters:  02/16/24 183 lb 12.8 oz (83.4 kg)  10/18/23 185 lb (83.9 kg)  08/06/23 187 lb (84.8 kg)     Exam: General: Pt appears well and is in NAD  Lungs: Clear with good BS bilat   Heart: RRR   Extremities: No  pretibial edema.  Neuro: MS is good with appropriate affect, pt is alert and Ox3       DM foot exam: 10/18/2023   The skin of the feet is intact without sores or ulcerations. The pedal pulses are 2+ on right and 2+ on left. The sensation is intact to a screening 5.07, 10 gram monofilament bilaterally   DATA REVIEWED:  Lab Results  Component Value Date   HGBA1C 6.5 (A) 02/16/2024   HGBA1C 6.6 (A) 10/18/2023   HGBA1C 6.6 (A) 04/01/2023    Latest Reference Range & Units 08/04/23 14:03  Sodium 135 - 145 mmol/L 140  Potassium 3.5 - 5.1 mmol/L 4.2  Chloride 98 - 111 mmol/L 104  CO2 22 - 32 mmol/L 23  Glucose 70 - 99 mg/dL 324 (H)  BUN 8 - 23 mg/dL 19  Creatinine 4.01 - 0.27 mg/dL 2.53  Calcium 8.9 - 66.4 mg/dL 9.1  Anion gap 5 - 15  13  GFR, Estimated >60 mL/min >60  (H): Data is abnormally high  ASSESSMENT / PLAN / RECOMMENDATIONS:   1) Type 2 Diabetes Mellitus, Optimally controlled, Without complications - Most recent A1c of 6.5%. Goal A1c < 7.0 %.     -A1c remains stable and at goal, but he has been noted with recurrent hypoglycemia   -I had switched glipizide to repaglinide due to hypoglycemia, but the patient has switched back to taking glipizide, we again discussed the fatal risk of hypoglycemia and given his age I would prefer to avoid hypoglycemic episodes and we will eventually able to target hyperglycemia -I did review his CGM download with him today, and he does have hypoglycemic episodes on average every other day - Will avoid GLp-1 agonists and  DPP-4 inhibitors due to history of pancreatitis -Will discontinue glipizide again and increase repaglinide  MEDICATIONS: Stop glipizide 5 mg  Take repaglinide 1 mg, 1 tablet before breakfast, lunch and supper Continue Metformin 500 mg, 2 tablet before Breakfast and 2 tablet before Supper    EDUCATION / INSTRUCTIONS: BG monitoring instructions: Patient is instructed to check his blood sugars 3 times a day, before meals . Call [facility] if: BG persistently < 70  I reviewed the Rule of 15 for the treatment of hypoglycemia in detail with the patient. Literature supplied.     2) Diabetic complications:  Eye: Does not have known diabetic retinopathy.  Neuro/ Feet: Does not have known diabetic peripheral neuropathy .  Renal: Patient does not have known baseline CKD. He   is  on an ACEI/ARB at present.      F/U in 4 months     I spent 25 minutes preparing to see the patient by review of recent labs, imaging and procedures, obtaining and reviewing separately obtained history, communicating with the patient, ordering medications, tests or procedures, and documenting clinical information in the EHR including the differential Dx, treatment, and any further evaluation and other management    Signed electronically by: Lyndle Herrlich, MD  [facility]  [facility] [address] [place] [place], [place] [address] Phone: [phone] FAX: [phone]   CC: Nelwyn Salisbury, MD [address] [place] [address] Phone: [phone]  Fax: [phone]  Return to Endocrinology clinic as below: Future Appointments  Date Time Provider Department Center  07/28/2024  3:40 PM [facility]-ANNUAL WELLNESS VISIT [facility]

## 2024-02-16 NOTE — Patient Instructions (Addendum)
-   Stop Glipizide 5 mg  - Take Repaglinide 1 mg before breakfast, lunch and Supper - Continue Metformin 500 mg, 2 tablet before Breakfast and 2 tablet before Supper     HOW TO TREAT LOW BLOOD SUGARS (Blood sugar LESS THAN 70 MG/DL) Please follow the RULE OF 15 for the treatment of hypoglycemia treatment (when your (blood sugars are less than 70 mg/dL)   STEP 1: Take 15 grams of carbohydrates when your blood sugar is low, which includes:  3-4 GLUCOSE TABS  OR 3-4 OZ OF JUICE OR REGULAR SODA OR ONE TUBE OF GLUCOSE GEL    STEP 2: RECHECK blood sugar in 15 MINUTES STEP 3: If your blood sugar is still low at the 15 minute recheck --> then, go back to STEP 1 and treat AGAIN with another 15 grams of carbohydes

## 2024-02-21 ENCOUNTER — Ambulatory Visit: Payer: Medicare PPO | Admitting: Internal Medicine

## 2024-03-19 ENCOUNTER — Other Ambulatory Visit: Payer: Self-pay | Admitting: Internal Medicine

## 2024-03-28 ENCOUNTER — Other Ambulatory Visit: Payer: Self-pay | Admitting: Internal Medicine

## 2024-03-28 ENCOUNTER — Ambulatory Visit: Admitting: Family Medicine

## 2024-03-28 ENCOUNTER — Encounter: Payer: Self-pay | Admitting: Family Medicine

## 2024-03-28 VITALS — BP 120/70 | HR 74 | Temp 98.1°F | Wt 179.9 lb

## 2024-03-28 DIAGNOSIS — S90852A Superficial foreign body, left foot, initial encounter: Secondary | ICD-10-CM

## 2024-03-28 NOTE — Progress Notes (Signed)
 Established Patient Office Visit  Subjective   Patient ID: RUMI TARAS, male    DOB: [birth year]  Age: 83 y.o. MRN: [id number]  Chief Complaint  Patient presents with   Foreign Body in Skin    HPI   Mr. Karnes is seen with soreness left heel of the foot past 3 to 4 days.  He thinks he may have a splinter fragment in his heel.  He recalls walking with socks on on his deck and there was some type of debris that had been falling out of a tree that was sharp that he thinks he may have stepped on.  He had soreness with ambulation past 3 to 4 days.  He saw some discoloration when he looked but this was inaccessible for him to get anything out himself.  Does have type 2 diabetes which is fairly well-controlled with recent A1c 6.5%.  No significant neuropathy symptoms.  Last tetanus 2018  Past Medical History:  Diagnosis Date   Arthritis    Diabetes mellitus    Hypertension    Palpitations 06/10/2010   Pancreatitis    Pneumonia    renal ca 05/20/2022   Past Surgical History:  Procedure Laterality Date   APPENDECTOMY  1957   COLONOSCOPY  12/19/2018   per Dr. Dellis Fermo, adenmatous polyps, repeat in 3 years    IR RADIOLOGIST EVAL & MGMT  04/02/2022   IR RADIOLOGIST EVAL & MGMT  06/15/2022   IR RADIOLOGIST EVAL & MGMT  06/30/2023   KNEE SURGERY  1990s, 2015, 2016   Torn meniscus L scope 2016, R 2015, L 1991 scope   RADIOFREQUENCY ABLATION Right 05/20/2022   Procedure: RIGHT CRYOABLATION;  Surgeon: Lucinda Saber, MD;  Location: [facility];  Service: Anesthesiology;  Laterality: Right;   SALIVARY GLAND SURGERY     as baby- removal- states tumor   TONSILLECTOMY      reports that he has never smoked. He has never used smokeless tobacco. He reports current alcohol use of about 1.0 - 7.0 standard drink of alcohol per week. He reports current drug use. family history includes Arthritis in his sister; CAD in his father; Dementia in his mother; Diabetes in his mother; Heart disease in his mother;  Hyperlipidemia in his father; Hyperthyroidism in his sister; Osteoporosis in his mother; Stroke in his mother. No Known Allergies  Review of Systems  Constitutional:  Negative for chills and fever.      Objective:     BP 120/70 (BP Location: Left Arm, Patient Position: Sitting, Cuff Size: Normal)   Pulse 74   Temp 98.1 F (36.7 C) (Oral)   Wt 179 lb 14.4 oz (81.6 kg)   SpO2 95%   BMI 29.04 kg/m  BP Readings from Last 3 Encounters:  03/28/24 120/70  02/16/24 118/68  10/18/23 122/74   Wt Readings from Last 3 Encounters:  03/28/24 179 lb 14.4 oz (81.6 kg)  02/16/24 183 lb 12.8 oz (83.4 kg)  10/18/23 185 lb (83.9 kg)      Physical Exam Vitals reviewed.  Constitutional:      General: He is not in acute distress.    Appearance: He is not ill-appearing.  Cardiovascular:     Rate and Rhythm: Normal rate and regular rhythm.  Skin:    Comments: Left foot is examined.  The heel of the foot reveals approximately 2 x 2 mm darkened area mid aspect of the heel of the foot.  Slightly tender to palpation.  No surrounding erythema.  No  drainage.  Neurological:     Mental Status: He is alert.      No results found for any visits on 03/28/24.    The ASCVD Risk score (Arnett DK, et al., 2019) failed to calculate for the following reasons:   The 2019 ASCVD risk score is only valid for ages 30 to 1    Assessment & Plan:   Foreign body left foot.  He has foreign body involving the heel of the foot calcaneal region.  No signs of secondary infection.  We recommended superficial incision and extraction.  Patient aware of low risk of infection and bleeding.  He consented.   We cleaned skin with alcohol wipe.  Using #15 blade gently shaved down some thickened skin around the foreign body.  With application of some surrounding pressure we were able to extract brownish discolored foreign body which was approximately 2 to 3 mm in length.  This was grasped with flat teeth forceps and pulled  out.   no bleeding.  No purulent drainage.  No signs of secondary infection.  No fluctuance.  -Topical antibiotic and Band-Aid applied - Patient was able to ambulate better afterwards without pain - Reviewed signs and symptoms to watch for for secondary infection  Glean Lamy, MD  Addendum to note above:   foreign body was embedded estimated depth of 2 mm.  Marquetta Sit MD [facility]

## 2024-03-28 NOTE — Patient Instructions (Signed)
 Clean foot with soap and water next couple of days  Follow up promptly for any redness, swelling, or increased swelling.

## 2024-03-29 ENCOUNTER — Other Ambulatory Visit: Payer: Self-pay

## 2024-03-29 MED ORDER — FREESTYLE LIBRE 2 PLUS SENSOR MISC: 1.0000 | 3 refills | Status: DC

## 2024-04-06 ENCOUNTER — Other Ambulatory Visit (HOSPITAL_COMMUNITY): Payer: Self-pay

## 2024-04-10 ENCOUNTER — Other Ambulatory Visit (HOSPITAL_COMMUNITY): Payer: Self-pay

## 2024-04-10 ENCOUNTER — Telehealth: Payer: Self-pay

## 2024-04-10 NOTE — Telephone Encounter (Signed)
 Pharmacy Patient Advocate Encounter   Received notification from CoverMyMeds that prior authorization for Freestyle libre 2 plus is required/requested.   Insurance verification completed.   The patient is insured through [employer] [place] .   Per test claim: PA required; PA started via CoverMyMeds. KEY [id number] . Please see clinical question(s) below that I am not finding the answer to in their chart and advise.   Please be advised that PA will likely be denied if he is not taking insulin

## 2024-04-11 NOTE — Telephone Encounter (Signed)
 Clinical info has been submitted

## 2024-04-17 NOTE — Telephone Encounter (Signed)
 Pharmacy Patient Advocate Encounter  Received notification from [employer] that Prior Authorization for [facility] 2 plus has been APPROVED from 11/10/23 to 11/08/2024   PA #/Case ID/Reference #:  [id number]

## 2024-05-16 ENCOUNTER — Encounter: Payer: Self-pay | Admitting: Internal Medicine

## 2024-05-16 ENCOUNTER — Ambulatory Visit: Admitting: Internal Medicine

## 2024-05-16 VITALS — BP 110/70 | HR 64 | Ht 66.0 in | Wt 180.0 lb

## 2024-05-16 DIAGNOSIS — Z7984 Long term (current) use of oral hypoglycemic drugs: Secondary | ICD-10-CM | POA: Diagnosis not present

## 2024-05-16 DIAGNOSIS — E119 Type 2 diabetes mellitus without complications: Secondary | ICD-10-CM | POA: Diagnosis not present

## 2024-05-16 LAB — POCT GLYCOSYLATED HEMOGLOBIN (HGB A1C): Hemoglobin A1C: 6.4 % — AB (ref 4.0–5.6)

## 2024-05-16 NOTE — Progress Notes (Signed)
 Name: Walter Campbell  Age/ Sex: 83 y.o., male   MRN/ DOB: [id number], Jun 03, 1941     PCP: Johnny Garnette LABOR, MD   Reason for Endocrinology Evaluation: Type 2 Diabetes Mellitus  Initial Endocrine Consultative Visit: 03/10/2021    PATIENT IDENTIFIER: Walter Campbell is a 83 y.o. male with a past medical history of T2Dm, HTN and dyslipidemia and Hx of pancreatitis  . The patient has followed with Endocrinology clinic since 03/10/2021 for consultative assistance with management of his diabetes.  DIABETIC HISTORY:  [name] was diagnosed with DM in 2010, januvia  has been ineffective.SABRA His hemoglobin A1c has ranged from 6.7% in 2021, peaking at 7.6% in 2022.   On his initial visit to our clinic he had an A1c 7.6% , we decreased Glipizide  due to hypoglycemia and continued Metformin    Switched glipizide  to repaglinide  due to hypoglycemia  SUBJECTIVE:   During the last visit (02/16/2024): A1c 6.5 %     Today (05/16/2024): Walter Campbell is here for a follow up on diabetes management . He checks his blood sugars multiple times daily, through CGM. The patient has had hypoglycemic episodes since the last clinic visit. He is rarely symptomatic with these episodes.   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  Denies nausea or vomiting   HOME DIABETES REGIMEN:  Metformin  500 mg, 2 tablet before Breakfast and 2 tablet before Supper  Repaglinide  1 mg, 1 tablet TIDQAC    Statin: yes ACE-I/ARB: yes Prior Diabetic Education: yes    CONTINUOUS GLUCOSE MONITORING RECORD INTERPRETATION    Dates of Recording: 6/25-05/16/2024  Sensor description: freestyle libre 2  Results statistics:   CGM use % of time 46  Average and SD 128/34.8  Time in range 85 %  % Time Above 180 13  % Time above 250 1  % Time Below target 1    Glycemic patterns summary: BGs are optimal throughout the night  and likes to read during the day Hyperglycemic episodes  post prandial   Hypoglycemic episodes occurred at variable times Overnight periods: Optimal    DIABETIC COMPLICATIONS: Microvascular complications:   Denies: CKD, retinopathy, neuropathy Last Eye Exam: Completed 11/2021  Macrovascular complications:   Denies: CAD, CVA, PVD   HISTORY:  Past Medical History:  Past Medical History:  Diagnosis Date   Arthritis    Diabetes mellitus    Hypertension    Palpitations 06/10/2010   Pancreatitis    Pneumonia    renal ca 05/20/2022   Past Surgical History:  Past Surgical History:  Procedure Laterality Date   APPENDECTOMY  1957   COLONOSCOPY  12/19/2018   per Dr. Donnald, adenmatous polyps, repeat in 3 years    IR RADIOLOGIST EVAL & MGMT  04/02/2022   IR RADIOLOGIST EVAL & MGMT  06/15/2022   IR RADIOLOGIST EVAL & MGMT  06/30/2023   KNEE SURGERY  1990s, 2015, 2016   Torn meniscus L scope 2016, R 2015, L 1991 scope   RADIOFREQUENCY ABLATION Right 05/20/2022   Procedure: RIGHT CRYOABLATION;  Surgeon: Vanice Sharper, MD;  Location: [facility];  Service: Anesthesiology;  Laterality: Right;   SALIVARY GLAND SURGERY     as baby- removal- states tumor   TONSILLECTOMY     Social History:  reports that he has never smoked. He has never used smokeless tobacco. He reports current alcohol use of about 1.0 -  7.0 standard drink of alcohol per week. He reports current drug use. Family History:  Family History  Problem Relation Age of Onset   Stroke Mother    Diabetes Mother    Osteoporosis Mother    Dementia Mother    Heart disease Mother        pacemaker   Hyperlipidemia Father        diet controlled   CAD Father        stents in 44s or 62s   Arthritis Sister    Hyperthyroidism Sister      HOME MEDICATIONS: Allergies as of 05/16/2024   No Known Allergies      Medication List        Accurate as of May 16, 2024 10:57 AM. If you have any questions, ask your nurse or doctor.           atorvastatin  20 MG tablet Commonly known as: LIPITOR TAKE 1 TABLET BY MOUTH ON MONDAYS, WEDNESDAYS AND FRIDAYS   benazepril -hydrochlorthiazide 20-25 MG tablet Commonly known as: LOTENSIN  HCT TAKE 1 TABLET BY MOUTH EVERY DAY   colestipol 1 g tablet Commonly known as: COLESTID 2 tablets Orally Once a day for 90 days   Creon 36000-114000 units Cpep capsule Generic drug: lipase/protease/amylase Take 1-2 capsules by mouth See admin instructions. Take 2 capsule with each meal and 1 capsule with a snack   Feldene 10 MG capsule Generic drug: piroxicam 5 mg 1 capsule with food Orally Once a day   felodipine  5 MG 24 hr tablet Commonly known as: PLENDIL  TAKE 1 TABLET (5 MG TOTAL) BY MOUTH DAILY.   FreeStyle Libre 2 Sensor Misc CHANGE EVERY 14 DAYS   FreeStyle Libre 2 Plus Sensor Misc 1 each by Does not apply route See admin instructions. CHANGE SENSORS EVERY 15 DAYS   Imodium A-D 2 MG tablet Generic drug: loperamide 1-3 tablets Orally in the morning   loratadine 10 MG tablet Commonly known as: CLARITIN Take 10 mg by mouth daily as needed for allergies.   metFORMIN  500 MG 24 hr tablet Commonly known as: GLUCOPHAGE -XR TAKE 2 TABLETS (1,000 MG TOTAL) BY MOUTH 2 (TWO) TIMES DAILY WITH A MEAL.   OVER THE COUNTER MEDICATION Apply 1 Application topically daily as needed (pain). CBD cream   repaglinide  1 MG tablet Commonly known as: PRANDIN  Take 1 tablet (1 mg total) by mouth 3 (three) times daily before meals.   tamsulosin  0.4 MG Caps capsule Commonly known as: FLOMAX  Take 1 capsule (0.4 mg total) by mouth daily.   traMADol 50 MG tablet Commonly known as: ULTRAM Take 50 mg by mouth every 4 (four) hours as needed.         OBJECTIVE:   Vital Signs: BP 110/70 (BP Location: Left Arm, Patient Position: Sitting, Cuff Size: Normal)   Pulse 64   Ht 5' 6 (1.676 m)   Wt 180 lb (81.6 kg)   SpO2 97%   BMI 29.05 kg/m   Wt Readings from Last 3 Encounters:   05/16/24 180 lb (81.6 kg)  03/28/24 179 lb 14.4 oz (81.6 kg)  02/16/24 183 lb 12.8 oz (83.4 kg)     Exam: General: Pt appears well and is in NAD  Lungs: Clear with good BS bilat   Heart: RRR   Extremities: Trace   pretibial edema.   Neuro: MS is good with appropriate affect, pt is alert and Ox3       DM foot exam: 10/18/2023   The skin of the  feet is intact without sores or ulcerations. The pedal pulses are 2+ on right and 2+ on left. The sensation is intact to a screening 5.07, 10 gram monofilament bilaterally   DATA REVIEWED:  Lab Results  Component Value Date   HGBA1C 6.5 (A) 02/16/2024   HGBA1C 6.6 (A) 10/18/2023   HGBA1C 6.6 (A) 04/01/2023    Latest Reference Range & Units 08/04/23 14:03  Sodium 135 - 145 mmol/L 140  Potassium 3.5 - 5.1 mmol/L 4.2  Chloride 98 - 111 mmol/L 104  CO2 22 - 32 mmol/L 23  Glucose 70 - 99 mg/dL 876 (H)  BUN 8 - 23 mg/dL 19  Creatinine 9.38 - 8.75 mg/dL 9.08  Calcium  8.9 - 10.3 mg/dL 9.1  Anion gap 5 - 15  13  GFR, Estimated >60 mL/min >60   ASSESSMENT / PLAN / RECOMMENDATIONS:   1) Type 2 Diabetes Mellitus, Optimally controlled, Without complications - Most recent A1c of 6.4%. Goal A1c < 7.0 %.     -A1c remains stable and at goal, but he has been noted with recurrent hypoglycemia on freestyle libre, patient asymptomatic, but it is unclear if these readings are accurate, I have asked the patient to confirm with a fingerstick -I had switched glipizide  to repaglinide  due to hypoglycemia, - Will avoid GLp-1 agonists and DPP-4 inhibitors due to history of pancreatitis - No changes at this time - I did advise the patient that if he is going to eat a smaller meal than usual he may reduce the repaglinide  to half a tablet - Patient was also advised to eat a protein bar prior to exercise if his BG is < 150 mg/dL   MEDICATIONS:  Take repaglinide  1 mg, 1 tablet before breakfast, lunch and supper Continue Metformin  500 mg, 2 tablet before  Breakfast and 2 tablet before Supper    EDUCATION / INSTRUCTIONS: BG monitoring instructions: Patient is instructed to check his blood sugars 3 times a day, before meals . Call [facility] if: BG persistently < 70  I reviewed the Rule of 15 for the treatment of hypoglycemia in detail with the patient. Literature supplied.     2) Diabetic complications:  Eye: Does not have known diabetic retinopathy.  Neuro/ Feet: Does not have known diabetic peripheral neuropathy .  Renal: Patient does not have known baseline CKD. He   is  on an ACEI/ARB at present.      F/U in 6 months      Signed electronically by: Stefano Redgie Butts, MD  [facility]  [facility] [address] [place] [place], [place] [address] Phone: [phone] FAX: [phone]   CC: Johnny Garnette LABOR, MD [address] [place] [address] Phone: [phone]  Fax: [phone]  Return to Endocrinology clinic as below: Future Appointments  Date Time Provider Department Center  07/28/2024  3:40 PM [facility]-ANNUAL WELLNESS VISIT [facility]

## 2024-05-16 NOTE — Patient Instructions (Signed)
-   Take Repaglinide  1 mg before breakfast, lunch and Supper - Continue Metformin  500 mg, 2 tablet before Breakfast and 2 tablet before Supper     HOW TO TREAT LOW BLOOD SUGARS (Blood sugar LESS THAN 70 MG/DL) Please follow the RULE OF 15 for the treatment of hypoglycemia treatment (when your (blood sugars are less than 70 mg/dL)   STEP 1: Take 15 grams of carbohydrates when your blood sugar is low, which includes:  3-4 GLUCOSE TABS  OR 3-4 OZ OF JUICE OR REGULAR SODA OR ONE TUBE OF GLUCOSE GEL    STEP 2: RECHECK blood sugar in 15 MINUTES STEP 3: If your blood sugar is still low at the 15 minute recheck --> then, go back to STEP 1 and treat AGAIN with another 15 grams of carbohydes

## 2024-05-17 ENCOUNTER — Ambulatory Visit: Payer: Self-pay | Admitting: Internal Medicine

## 2024-05-17 LAB — MICROALBUMIN / CREATININE URINE RATIO
Creatinine, Urine: 84 mg/dL (ref 20–320)
Microalb Creat Ratio: 8 mg/g{creat} (ref <30)
Microalb, Ur: 0.7 mg/dL

## 2024-07-24 ENCOUNTER — Telehealth: Payer: Self-pay

## 2024-07-24 DIAGNOSIS — E119 Type 2 diabetes mellitus without complications: Secondary | ICD-10-CM

## 2024-07-24 DIAGNOSIS — N138 Other obstructive and reflux uropathy: Secondary | ICD-10-CM

## 2024-07-24 NOTE — Telephone Encounter (Signed)
 Copied from CRM [id number]. Topic: Clinical - Request for Lab/Test Order >> Jul 24, 2024  2:36 PM Walter Campbell wrote: Reason for CRM: Patient requesting labs prior to physical.

## 2024-07-25 NOTE — Addendum Note (Signed)
 Addended by: JOHNNY SENIOR A on: 07/25/2024 12:33 PM   Modules accepted: Orders

## 2024-07-25 NOTE — Telephone Encounter (Signed)
 Pt has a CPE on 07/26/24. Requests to have labs done today so he can review them tomorrow at his CPE. Please advise

## 2024-07-25 NOTE — Telephone Encounter (Signed)
The orders have been placed  

## 2024-07-26 ENCOUNTER — Encounter: Payer: Self-pay | Admitting: Family Medicine

## 2024-07-26 ENCOUNTER — Ambulatory Visit (INDEPENDENT_AMBULATORY_CARE_PROVIDER_SITE_OTHER): Admitting: Family Medicine

## 2024-07-26 VITALS — BP 110/58 | HR 67 | Temp 98.2°F | Ht 66.0 in | Wt 180.0 lb

## 2024-07-26 DIAGNOSIS — N401 Enlarged prostate with lower urinary tract symptoms: Secondary | ICD-10-CM

## 2024-07-26 DIAGNOSIS — N138 Other obstructive and reflux uropathy: Secondary | ICD-10-CM

## 2024-07-26 DIAGNOSIS — I10 Essential (primary) hypertension: Secondary | ICD-10-CM

## 2024-07-26 DIAGNOSIS — E119 Type 2 diabetes mellitus without complications: Secondary | ICD-10-CM

## 2024-07-26 DIAGNOSIS — C641 Malignant neoplasm of right kidney, except renal pelvis: Secondary | ICD-10-CM | POA: Diagnosis not present

## 2024-07-26 MED ORDER — TAMSULOSIN HCL 0.4 MG PO CAPS: 0.8000 mg | ORAL_CAPSULE | Freq: Every day | ORAL | 3 refills | Status: AC

## 2024-07-26 NOTE — Progress Notes (Signed)
 Subjective:    Patient ID: Walter Campbell, male    DOB: 1940/12/03, 83 y.o.   MRN: [id number]  HPI Here to follow up on issues. He feels well in general. He plays golf 2-3 days a week and he takes Pilates classes 3 days a week. He sees Dr. Sam for his diabetes, and this has been well managed. His last A1c in July was 6.4%. He sees Dr. Neysa in Urology to follow his hx of renal cancer. He takes Flomax  0.4 mg once daily for his BPH, and this helps, but he asks if he can take a higher dose. He still has urinary urgency throughout the day. His GERD is stable. His HTN is stable.    Review of Systems  Constitutional: Negative.   HENT: Negative.    Eyes: Negative.   Respiratory: Negative.    Cardiovascular: Negative.   Gastrointestinal: Negative.   Genitourinary:  Positive for difficulty urinating and urgency. Negative for dysuria and frequency.  Musculoskeletal: Negative.   Skin: Negative.   Neurological: Negative.   Psychiatric/Behavioral: Negative.         Objective:   Physical Exam Constitutional:      General: He is not in acute distress.    Appearance: Normal appearance. He is well-developed. He is not diaphoretic.  HENT:     Head: Normocephalic and atraumatic.     Right Ear: External ear normal.     Left Ear: External ear normal.     Nose: Nose normal.     Mouth/Throat:     Pharynx: No oropharyngeal exudate.  Eyes:     General: No scleral icterus.       Right eye: No discharge.        Left eye: No discharge.     Conjunctiva/sclera: Conjunctivae normal.     Pupils: Pupils are equal, round, and reactive to light.  Neck:     Thyroid : No thyromegaly.     Vascular: No JVD.     Trachea: No tracheal deviation.  Cardiovascular:     Rate and Rhythm: Normal rate and regular rhythm.     Pulses: Normal pulses.     Heart sounds: Normal heart sounds. No murmur heard.    No friction rub. No gallop.  Pulmonary:     Effort: Pulmonary effort is normal. No respiratory  distress.     Breath sounds: Normal breath sounds. No wheezing or rales.  Chest:     Chest wall: No tenderness.  Abdominal:     General: Bowel sounds are normal. There is no distension.     Palpations: Abdomen is soft. There is no mass.     Tenderness: There is no abdominal tenderness. There is no guarding or rebound.  Genitourinary:    Penis: Normal. No tenderness.      Testes: Normal.     Prostate: Normal.     Rectum: Normal. Guaiac result negative.  Musculoskeletal:        General: No tenderness. Normal range of motion.     Cervical back: Neck supple.  Lymphadenopathy:     Cervical: No cervical adenopathy.  Skin:    General: Skin is warm and dry.     Coloration: Skin is not pale.     Findings: No erythema or rash.  Neurological:     General: No focal deficit present.     Mental Status: He is alert and oriented to person, place, and time.     Cranial Nerves: No cranial nerve deficit.  Motor: No abnormal muscle tone.     Coordination: Coordination normal.     Deep Tendon Reflexes: Reflexes are normal and symmetric. Reflexes normal.  Psychiatric:        Mood and Affect: Mood normal.        Behavior: Behavior normal.        Thought Content: Thought content normal.        Judgment: Judgment normal.           Assessment & Plan:  His HTN and GERD are stable. He will follow up with Dr. Sam for type 2 diabetes. He will follow up with Dr. Neysa for the hx of renal cancer. For the BPH, we will increase the Flomax  to 0.8 mg daily. He had labs drawn this morning and we await this report. We spent a total of ( 35  ) minutes reviewing records and discussing these issues.  Garnette Olmsted, MD

## 2024-07-26 NOTE — Telephone Encounter (Signed)
Left a message on pt voice mail

## 2024-07-27 DIAGNOSIS — D2262 Melanocytic nevi of left upper limb, including shoulder: Secondary | ICD-10-CM | POA: Diagnosis not present

## 2024-07-27 DIAGNOSIS — L720 Epidermal cyst: Secondary | ICD-10-CM | POA: Diagnosis not present

## 2024-07-27 DIAGNOSIS — L918 Other hypertrophic disorders of the skin: Secondary | ICD-10-CM | POA: Diagnosis not present

## 2024-07-27 DIAGNOSIS — D225 Melanocytic nevi of trunk: Secondary | ICD-10-CM | POA: Diagnosis not present

## 2024-07-27 DIAGNOSIS — L570 Actinic keratosis: Secondary | ICD-10-CM | POA: Diagnosis not present

## 2024-07-27 DIAGNOSIS — D2272 Melanocytic nevi of left lower limb, including hip: Secondary | ICD-10-CM | POA: Diagnosis not present

## 2024-07-27 DIAGNOSIS — D2372 Other benign neoplasm of skin of left lower limb, including hip: Secondary | ICD-10-CM | POA: Diagnosis not present

## 2024-07-27 DIAGNOSIS — D692 Other nonthrombocytopenic purpura: Secondary | ICD-10-CM | POA: Diagnosis not present

## 2024-07-27 LAB — BASIC METABOLIC PANEL WITH GFR
BUN: 21 mg/dL (ref 6–23)
CO2: 28 meq/L (ref 19–32)
Calcium: 9 mg/dL (ref 8.4–10.5)
Chloride: 106 meq/L (ref 96–112)
Creatinine, Ser: 0.88 mg/dL (ref 0.40–1.50)
GFR: 80.45 mL/min (ref >60.00)
Glucose, Bld: 144 mg/dL — ABNORMAL HIGH (ref 70–99)
Potassium: 4 meq/L (ref 3.5–5.1)
Sodium: 141 meq/L (ref 135–145)

## 2024-07-27 LAB — CBC WITH DIFFERENTIAL/PLATELET
Basophils Absolute: 0.1 K/uL (ref 0.0–0.1)
Basophils Relative: 0.9 % (ref 0.0–3.0)
Eosinophils Absolute: 0.1 K/uL (ref 0.0–0.7)
Eosinophils Relative: 1.9 % (ref 0.0–5.0)
HCT: 39.4 % (ref 39.0–52.0)
Hemoglobin: 12.7 g/dL — ABNORMAL LOW (ref 13.0–17.0)
Lymphocytes Relative: 17.2 % (ref 12.0–46.0)
Lymphs Abs: 1.3 K/uL (ref 0.7–4.0)
MCHC: 32.2 g/dL (ref 30.0–36.0)
MCV: 90.3 fl (ref 78.0–100.0)
Monocytes Absolute: 0.6 K/uL (ref 0.1–1.0)
Monocytes Relative: 7.4 % (ref 3.0–12.0)
Neutro Abs: 5.6 K/uL (ref 1.4–7.7)
Neutrophils Relative %: 72.6 % (ref 43.0–77.0)
Platelets: 260 K/uL (ref 150.0–400.0)
RBC: 4.37 Mil/uL (ref 4.22–5.81)
RDW: 14.5 % (ref 11.5–15.5)
WBC: 7.7 K/uL (ref 4.0–10.5)

## 2024-07-27 LAB — LIPID PANEL
Cholesterol: 102 mg/dL (ref 0–200)
HDL: 52.7 mg/dL (ref >39.00)
LDL Cholesterol: 36 mg/dL (ref 0–99)
NonHDL: 49.23
Total CHOL/HDL Ratio: 2
Triglycerides: 67 mg/dL (ref 0.0–149.0)
VLDL: 13.4 mg/dL (ref 0.0–40.0)

## 2024-07-27 LAB — TSH: TSH: 1.12 u[IU]/mL (ref 0.35–5.50)

## 2024-07-27 LAB — HEPATIC FUNCTION PANEL
ALT: 16 U/L (ref 0–53)
AST: 17 U/L (ref 0–37)
Albumin: 4.2 g/dL (ref 3.5–5.2)
Alkaline Phosphatase: 60 U/L (ref 39–117)
Bilirubin, Direct: 0.1 mg/dL (ref 0.0–0.3)
Total Bilirubin: 0.3 mg/dL (ref 0.2–1.2)
Total Protein: 6.4 g/dL (ref 6.0–8.3)

## 2024-07-27 LAB — PSA: PSA: 4.88 ng/mL — ABNORMAL HIGH (ref 0.10–4.00)

## 2024-07-27 LAB — HEMOGLOBIN A1C: Hgb A1c MFr Bld: 7.2 % — ABNORMAL HIGH (ref 4.6–6.5)

## 2024-07-28 ENCOUNTER — Ambulatory Visit: Payer: Self-pay | Admitting: Family Medicine

## 2024-08-16 ENCOUNTER — Other Ambulatory Visit: Payer: Self-pay | Admitting: Interventional Radiology

## 2024-08-16 DIAGNOSIS — N2889 Other specified disorders of kidney and ureter: Secondary | ICD-10-CM

## 2024-08-24 ENCOUNTER — Encounter: Payer: Self-pay | Admitting: Interventional Radiology

## 2024-08-28 ENCOUNTER — Ambulatory Visit
Admission: RE | Admit: 2024-08-28 | Discharge: 2024-08-28 | Disposition: A | Discharge status: Home / Self Care | Source: Ambulatory Visit | Attending: Interventional Radiology | Admitting: Interventional Radiology

## 2024-08-28 DIAGNOSIS — K859 Acute pancreatitis without necrosis or infection, unspecified: Secondary | ICD-10-CM | POA: Diagnosis not present

## 2024-08-28 DIAGNOSIS — N2889 Other specified disorders of kidney and ureter: Secondary | ICD-10-CM

## 2024-08-28 MED ORDER — IOPAMIDOL (ISOVUE-300) INJECTION 61%
100.0000 mL | Freq: Once | INTRAVENOUS | Status: AC | PRN
  Administered 2024-08-28: 100 mL via INTRAVENOUS

## 2024-09-04 DIAGNOSIS — H0102A Squamous blepharitis right eye, upper and lower eyelids: Secondary | ICD-10-CM | POA: Diagnosis not present

## 2024-09-04 DIAGNOSIS — Z961 Presence of intraocular lens: Secondary | ICD-10-CM | POA: Diagnosis not present

## 2024-09-04 DIAGNOSIS — H43813 Vitreous degeneration, bilateral: Secondary | ICD-10-CM | POA: Diagnosis not present

## 2024-09-04 DIAGNOSIS — H353131 Nonexudative age-related macular degeneration, bilateral, early dry stage: Secondary | ICD-10-CM | POA: Diagnosis not present

## 2024-09-04 DIAGNOSIS — E119 Type 2 diabetes mellitus without complications: Secondary | ICD-10-CM | POA: Diagnosis not present

## 2024-09-04 DIAGNOSIS — H0102B Squamous blepharitis left eye, upper and lower eyelids: Secondary | ICD-10-CM | POA: Diagnosis not present

## 2024-09-04 LAB — OPHTHALMOLOGY REPORT-SCANNED

## 2024-09-05 ENCOUNTER — Telehealth: Payer: Self-pay | Admitting: *Deleted

## 2024-09-05 ENCOUNTER — Ambulatory Visit
Admission: RE | Admit: 2024-09-05 | Discharge: 2024-09-05 | Disposition: A | Discharge status: Home / Self Care | Source: Ambulatory Visit | Attending: Interventional Radiology | Admitting: Interventional Radiology

## 2024-09-05 DIAGNOSIS — N2889 Other specified disorders of kidney and ureter: Secondary | ICD-10-CM

## 2024-09-05 HISTORY — PX: IR RADIOLOGIST EVAL & MGMT: IMG5224

## 2024-09-05 NOTE — Progress Notes (Signed)
 Patient ID: Walter Campbell, male   DOB: April 18, 1941, 83 y.o.   MRN: [id number]       Chief Complaint:   Right renal neoplasm by imaging  Referring Physician(s): Dr. Devere  History of Present Illness: Walter Campbell is a 83 y.o. male Who is now 2 years status post right renal neoplasm CT-guided cryoablation.  Procedure performed at [facility] back in July 2023.  Overall he continues to do very well.  He remains very active.  No physical limitations.  He is asymptomatic.  No flank pain, abdominal pain or other urinary tract symptoms.  No dysuria or hematuria.  No recent illness or fevers.  Interval surveillance imaging performed 08/28/2024.  This confirms a stable ablation defect in the right kidney midpole with scarring and retraction.  No signs of enhancement to suggest residual or recurrent disease.  Stable chronic atrophied pancreas with ductal dilatation and intraductal calculi.  Stable aortic atherosclerosis.  No aneurysm.  No other acute process.  Past Medical History:  Diagnosis Date   Arthritis    Diabetes mellitus    Hypertension    Palpitations 06/10/2010   Pancreatitis    Pneumonia    renal ca 05/20/2022    Past Surgical History:  Procedure Laterality Date   APPENDECTOMY  1957   COLONOSCOPY  12/19/2018   per Dr. Donnald, adenmatous polyps, repeat in 3 years    IR RADIOLOGIST EVAL & MGMT  04/02/2022   IR RADIOLOGIST EVAL & MGMT  06/15/2022   IR RADIOLOGIST EVAL & MGMT  06/30/2023   KNEE SURGERY  1990s, 2015, 2016   Torn meniscus L scope 2016, R 2015, L 1991 scope   RADIOFREQUENCY ABLATION Right 05/20/2022   Procedure: RIGHT CRYOABLATION;  Surgeon: Vanice Sharper, MD;  Location: [facility];  Service: Anesthesiology;  Laterality: Right;   SALIVARY GLAND SURGERY     as baby- removal- states tumor   TONSILLECTOMY      Allergies: Patient has no known allergies.  Medications: Prior to Admission medications   Medication Sig Start Date End Date Taking?  Authorizing Provider  atorvastatin  (LIPITOR) 20 MG tablet TAKE 1 TABLET BY MOUTH ON MONDAYS, WEDNESDAYS AND FRIDAYS 12/28/23   Johnny Garnette LABOR, MD  benazepril -hydrochlorthiazide (LOTENSIN  HCT) 20-25 MG tablet TAKE 1 TABLET BY MOUTH EVERY DAY 12/28/23   Johnny Garnette LABOR, MD  Continuous Glucose Sensor (FREESTYLE LIBRE 2 PLUS SENSOR) MISC 1 each by Does not apply route See admin instructions. CHANGE SENSORS EVERY 15 DAYS 03/29/24   Shamleffer, Ibtehal Jaralla, MD  CREON 36000-114000 units CPEP capsule Take 1-2 capsules by mouth See admin instructions. Take 2 capsule with each meal and 1 capsule with a snack 02/03/22   [provider]  felodipine  (PLENDIL ) 5 MG 24 hr tablet TAKE 1 TABLET (5 MG TOTAL) BY MOUTH DAILY. 12/28/23   Johnny Garnette LABOR, MD  loperamide (IMODIUM A-D) 2 MG tablet 1-3 tablets Orally in the morning    [provider]  loratadine (CLARITIN) 10 MG tablet Take 10 mg by mouth daily as needed for allergies.    [provider]  metFORMIN  (GLUCOPHAGE -XR) 500 MG 24 hr tablet TAKE 2 TABLETS (1,000 MG TOTAL) BY MOUTH 2 (TWO) TIMES DAILY WITH A MEAL. 03/20/24   Shamleffer, Ibtehal Jaralla, MD  OVER THE COUNTER MEDICATION Apply 1 Application topically daily as needed (pain). CBD cream    [provider]  piroxicam (FELDENE) 10 MG capsule 5 mg 1 capsule with food Orally Once a day  [provider]  repaglinide  (PRANDIN ) 1 MG tablet Take 1 tablet (1 mg total) by mouth 3 (three) times daily before meals. 02/16/24   Shamleffer, Ibtehal Jaralla, MD  tamsulosin  (FLOMAX ) 0.4 MG CAPS capsule Take 2 capsules (0.8 mg total) by mouth daily. 07/26/24   Johnny Garnette LABOR, MD     Family History  Problem Relation Age of Onset   Stroke Mother    Diabetes Mother    Osteoporosis Mother    Dementia Mother    Heart disease Mother        pacemaker   Hyperlipidemia Father        diet controlled   CAD Father        stents in 40s or 13s   Arthritis Sister    Hyperthyroidism  Sister     Social History   Socioeconomic History   Marital status: Married    Spouse name: Not on file   Number of children: Not on file   Years of education: Not on file   Highest education level: Not on file  Occupational History   Not on file  Tobacco Use   Smoking status: Never   Smokeless tobacco: Never  Vaping Use   Vaping status: Never Used  Substance and Sexual Activity   Alcohol use: Yes    Alcohol/week: 1.0 - 7.0 standard drink of alcohol    Types: 1 - 7 Standard drinks or equivalent per week    Comment: occas.   Drug use: Yes    Comment: cbd ointment   Sexual activity: Not on file  Other Topics Concern   Not on file  Social History Narrative   Married. 1 daughter, 2 step sons. 8 grandkids. No greatgrandkids.       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: golf, bridge, travel, reading   Social Drivers of Corporate Investment Banker Strain: Low Risk  (07/26/2023)   Overall Financial Resource Strain (CARDIA)    Difficulty of Paying Living Expenses: Not hard at all  Food Insecurity: No Food Insecurity (07/26/2023)   Hunger Vital Sign    Worried About Running Out of Food in the Last Year: Never true    Ran Out of Food in the Last Year: Never true  Transportation Needs: No Transportation Needs (07/26/2023)   PRAPARE - Administrator, Civil Service (Medical): No    Lack of Transportation (Non-Medical): No  Physical Activity: Sufficiently Active (07/26/2023)   Exercise Vital Sign    Days of Exercise per Week: 4 days    Minutes of Exercise per Session: 50 min  Stress: No Stress Concern Present (07/26/2023)   [employer] of Occupational Health - Occupational Stress Questionnaire    Feeling of Stress : Not at all  Social Connections: Socially Isolated (07/26/2023)   Social Connection and Isolation Panel    Frequency of Communication with Friends and Family: More than three times a week    Frequency of Social Gatherings with  Friends and Family: More than three times a week    Attends Religious Services: Never    Database Administrator or Organizations: No    Attends Banker Meetings: Never    Marital Status: Widowed       Review of Systems: A 12 point ROS discussed and pertinent positives are indicated in the HPI above.  All other systems are negative.  Review of Systems  Vital Signs: BP (!) 147/63 (BP Location: Left  Arm, Patient Position: Sitting, Cuff Size: Normal)   Pulse 74   Temp 97.9 F (36.6 C) (Oral)   Resp 16   SpO2 100%       Physical Exam Constitutional:      General: He is not in acute distress.    Appearance: He is not toxic-appearing.  Eyes:     General: No scleral icterus.    Conjunctiva/sclera: Conjunctivae normal.  Cardiovascular:     Rate and Rhythm: Normal rate and regular rhythm.     Heart sounds: No murmur heard. Pulmonary:     Effort: Pulmonary effort is normal. No respiratory distress.     Breath sounds: Normal breath sounds. No wheezing.  Abdominal:     General: Abdomen is flat. Bowel sounds are normal.     Palpations: Abdomen is soft.  Musculoskeletal:        General: No swelling.  Skin:    Coloration: Skin is not jaundiced.  Neurological:     General: No focal deficit present.     Mental Status: He is alert.  Psychiatric:        Mood and Affect: Mood normal.        Thought Content: Thought content normal.           Imaging: CT ABDOMEN W WO CONTRAST Result Date: 08/30/2024 CLINICAL DATA:  Right renal cryoablation follow-up, performed 05/2022 * Tracking Code: BO * EXAM: CT ABDOMEN WITHOUT AND WITH CONTRAST TECHNIQUE: Multidetector CT imaging of the abdomen was performed following the standard protocol before and following the bolus administration of intravenous contrast. RADIATION DOSE REDUCTION: This exam was performed according to the departmental dose-optimization program which includes automated exposure control, adjustment of the mA  and/or kV according to patient size and/or use of iterative reconstruction technique. CONTRAST:  ISOVUE-300 IOPAMIDOL (ISOVUE-300) INJECTION 61% COMPARISON:  06/24/2023 FINDINGS: Lower chest: No acute abnormality. Coronary artery calcifications. Elevation of the right hemidiaphragm. Hepatobiliary: No solid liver abnormality is seen. No gallstones, gallbladder wall thickening, or biliary dilatation. Pancreas: Atrophic, calcified pancreas, including large intraductal calculi near the ampulla and diffuse pancreatic ductal dilatation. No acute inflammatory findings. Spleen: Normal in size without significant abnormality. Adrenals/Urinary Tract: Adrenal glands are unremarkable. Unchanged appearance of a nonenhancing ablation site in the midportion of the right kidney without evidence of suspicious soft tissue or arterial enhancement to suggest local recurrence (series 10, image 90). Left kidney is normal, without renal calculi, solid lesion, or hydronephrosis. Stomach/Bowel: Stomach is within normal limits. No evidence of bowel wall thickening, distention, or inflammatory changes. Vascular/Lymphatic: Aortic atherosclerosis. No enlarged abdominal lymph nodes. Other: No abdominal wall hernia or abnormality. No ascites. Musculoskeletal: No acute or significant osseous findings. IMPRESSION: 1. Unchanged appearance of a nonenhancing ablation site in the midportion of the right kidney without evidence of suspicious soft tissue or arterial enhancement to suggest local recurrence. 2. No evidence of lymphadenopathy or metastatic disease in the abdomen. 3. Atrophic, calcified pancreas, including large intraductal calculi near the ampulla and diffuse pancreatic ductal dilatation. No acute inflammatory findings. 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 4. Coronary artery disease. Aortic Atherosclerosis (ICD10-I70.0). Electronically Signed   By: Marolyn JONETTA Jaksch M.D.   On: 08/30/2024 07:03     Labs:  CBC: Recent Labs    07/26/24 1548  WBC 7.7  HGB 12.7*  HCT 39.4  PLT 260.0    COAGS: No results for input(s): INR, APTT in the last 8760 hours.  BMP: Recent Labs    07/26/24 1548  NA 141  K 4.0  CL 106  CO2 28  GLUCOSE 144*  BUN 21  CALCIUM  9.0  CREATININE 0.88    LIVER FUNCTION TESTS: Recent Labs    07/26/24 1548  BILITOT 0.3  AST 17  ALT 16  ALKPHOS 60  PROT 6.4  ALBUMIN 4.2       Assessment and Plan:  2 years status post CT-guided cryoablation of a right renal lateral midpole renal neoplasm by imaging.  Annual surveillance imaging confirms further retraction of the nonenhancing ablation defect.  No suspicious abnormality or residual disease by CT.  Overall he continues to do very well and is asymptomatic.  No physical limitations.  All questions and concerns addressed.  Plan: Schedule for continued annual surveillance CT without and with contrast in 12 months (October 2025).  This [facility] be [facility].     Electronically Signed: Ozell Specking 09/05/2024, 8:52 AM   I spent a total of    25 Minutes in face to face in clinical consultation, greater than 50% of which was counseling/coordinating care for This patient status post cryoablation of a small renal neoplasm

## 2024-09-05 NOTE — Telephone Encounter (Signed)
 Copied from CRM [id number]. Topic: General - Call Back - No Documentation >> Sep 05, 2024  2:00 PM Harlene ORN wrote: Reason for CRM: Returning a message from Nurse.

## 2024-09-06 NOTE — Telephone Encounter (Signed)
 Attempted to call pt with no successful, pt was given lab results and voiced understanding

## 2024-09-08 NOTE — Telephone Encounter (Signed)
 Reason for the call was to review lab results with pt, already notified of results and voiced understanding

## 2024-09-12 ENCOUNTER — Other Ambulatory Visit: Payer: Self-pay | Admitting: Internal Medicine

## 2024-10-30 ENCOUNTER — Ambulatory Visit: Admitting: Family Medicine

## 2024-11-07 ENCOUNTER — Encounter: Payer: Self-pay | Admitting: Family Medicine

## 2024-11-07 ENCOUNTER — Ambulatory Visit: Admitting: Family Medicine

## 2024-11-07 VITALS — BP 110/60 | HR 63 | Temp 97.7°F | Wt 184.0 lb

## 2024-11-07 DIAGNOSIS — N138 Other obstructive and reflux uropathy: Secondary | ICD-10-CM | POA: Diagnosis not present

## 2024-11-07 DIAGNOSIS — I10 Essential (primary) hypertension: Secondary | ICD-10-CM | POA: Diagnosis not present

## 2024-11-07 DIAGNOSIS — Z7984 Long term (current) use of oral hypoglycemic drugs: Secondary | ICD-10-CM

## 2024-11-07 DIAGNOSIS — E119 Type 2 diabetes mellitus without complications: Secondary | ICD-10-CM | POA: Diagnosis not present

## 2024-11-07 DIAGNOSIS — N401 Enlarged prostate with lower urinary tract symptoms: Secondary | ICD-10-CM

## 2024-11-07 LAB — PSA: PSA: 3.6 ng/mL (ref 0.10–4.00)

## 2024-11-07 LAB — HEMOGLOBIN A1C: Hgb A1c MFr Bld: 6.7 % — ABNORMAL HIGH (ref 4.6–6.5)

## 2024-11-07 NOTE — Progress Notes (Signed)
" ° °  Subjective:    Patient ID: Walter Campbell, male    DOB: June 29, 1941, 83 y.o.   MRN: [id number]  HPI Here to follow up on HTN and some abnormal labs. He was here in September, and his A1c had crept up to 7.2 %. His PSA had also risen to 4.88. we agreed to follow these up in 90 days. Overall he feels well.    Review of Systems  Constitutional: Negative.   Respiratory: Negative.    Cardiovascular: Negative.        Objective:   Physical Exam Constitutional:      Appearance: Normal appearance.  Cardiovascular:     Rate and Rhythm: Normal rate and regular rhythm.     Pulses: Normal pulses.     Heart sounds: Normal heart sounds.  Pulmonary:     Effort: Pulmonary effort is normal.     Breath sounds: Normal breath sounds.  Neurological:     Mental Status: He is alert.           Assessment & Plan:  His HTN is stable. We will recheck an A1c for the type 2 diabetes, and we wil recheck a PSA today. Garnette Olmsted, MD   "

## 2024-11-08 ENCOUNTER — Ambulatory Visit: Payer: Self-pay | Admitting: Family Medicine

## 2024-11-16 ENCOUNTER — Encounter: Payer: Self-pay | Admitting: Internal Medicine

## 2024-11-16 ENCOUNTER — Ambulatory Visit: Admitting: Internal Medicine

## 2024-11-16 VITALS — BP 100/60 | HR 74 | Ht 66.0 in | Wt 181.0 lb

## 2024-11-16 DIAGNOSIS — E119 Type 2 diabetes mellitus without complications: Secondary | ICD-10-CM

## 2024-11-16 DIAGNOSIS — Z7984 Long term (current) use of oral hypoglycemic drugs: Secondary | ICD-10-CM | POA: Diagnosis not present

## 2024-11-16 MED ORDER — REPAGLINIDE 1 MG PO TABS: 1.0000 mg | ORAL_TABLET | Freq: Three times a day (TID) | ORAL | 3 refills | Status: AC

## 2024-11-16 MED ORDER — METFORMIN HCL ER 500 MG PO TB24: 1000.0000 mg | ORAL_TABLET | Freq: Two times a day (BID) | ORAL | 3 refills | Status: AC

## 2024-11-16 NOTE — Patient Instructions (Signed)
-   Take Repaglinide  1 mg before breakfast, lunch and Supper - Continue Metformin  500 mg, 2 tablet before Breakfast and 2 tablet before Supper     HOW TO TREAT LOW BLOOD SUGARS (Blood sugar LESS THAN 70 MG/DL) Please follow the RULE OF 15 for the treatment of hypoglycemia treatment (when your (blood sugars are less than 70 mg/dL)   STEP 1: Take 15 grams of carbohydrates when your blood sugar is low, which includes:  3-4 GLUCOSE TABS  OR 3-4 OZ OF JUICE OR REGULAR SODA OR ONE TUBE OF GLUCOSE GEL    STEP 2: RECHECK blood sugar in 15 MINUTES STEP 3: If your blood sugar is still low at the 15 minute recheck --> then, go back to STEP 1 and treat AGAIN with another 15 grams of carbohydes

## 2024-11-16 NOTE — Progress Notes (Signed)
 "   Name: Walter Campbell  Age/ Sex: 84 y.o., male   MRN/ DOB: [id number], [birth year]     PCP: Walter Garnette LABOR, MD   Reason for Endocrinology Evaluation: Type 2 Diabetes Mellitus  Initial Endocrine Consultative Visit: 03/10/2021    PATIENT IDENTIFIER: [name] is a 84 y.o. male with a past medical history of T2Dm, HTN and dyslipidemia and Hx of pancreatitis  . The patient has followed with Endocrinology clinic since 03/10/2021 for consultative assistance with management of his diabetes.  DIABETIC HISTORY:  Walter Campbell was diagnosed with DM in 2010, januvia  has been ineffective.Walter Campbell His hemoglobin A1c has ranged from 6.7% in 2021, peaking at 7.6% in 2022.   On his initial visit to our clinic he had an A1c 7.6% , we decreased Glipizide  due to hypoglycemia and continued Metformin    Switched glipizide  to repaglinide  due to hypoglycemia  SUBJECTIVE:   During the last visit (05/16/2024): A1c 6.4 %     Today (11/16/2024): Walter Campbell is here for a follow up on diabetes management . He checks his blood sugars multiple times daily, through CGM. The patient has had hypoglycemic episodes since the last clinic visit. These are not accurate per pt   He is S/P cryoablation of right renal neoplasm  05/20/2022, CT abdomen October, 2025 showed unchanged appearance of a 9 enhancing ablation site in the midportion of the right kidney without evidence of suspicious soft tissue or arterial enhancement to suggest local recurrence.   On Creon - follows with GI for diarrhea Has chronic loose stools due to hx of pancreatitis  No nausea    HOME DIABETES REGIMEN:  Metformin  500 mg, 2 tablet before Breakfast and 2 tablet before Supper  Repaglinide  1 mg, 1 tablet TIDQAC    Statin: yes ACE-I/ARB: yes Prior Diabetic Education: yes    CONTINUOUS GLUCOSE MONITORING RECORD INTERPRETATION    Dates of Recording: 12/25-11/15/2024  Sensor description: freestyle libre   Results statistics:   CGM use % of  time 21  Average and SD 115/33.5  Time in range 90%  % Time Above 180 9  % Time above 250 0  % Time Below target 1    Glycemic patterns summary: BGs are optimal overnight and fluctuate during the day Hyperglycemic episodes postprandial  Hypoglycemic episodes occurred at variable times Overnight periods: Optimal    DIABETIC COMPLICATIONS: Microvascular complications:   Denies: CKD, retinopathy, neuropathy Last Eye Exam: Completed 09/04/2024  Macrovascular complications:   Denies: CAD, CVA, PVD   HISTORY:  Past Medical History:  Past Medical History:  Diagnosis Date   Arthritis    Diabetes mellitus    Hypertension    Palpitations 06/10/2010   Pancreatitis    Pneumonia    renal ca 05/20/2022   Past Surgical History:  Past Surgical History:  Procedure Laterality Date   APPENDECTOMY  1957   COLONOSCOPY  12/19/2018   per Dr. Donnald, adenmatous polyps, repeat in 3 years    IR RADIOLOGIST EVAL & MGMT  04/02/2022   IR RADIOLOGIST EVAL & MGMT  06/15/2022   IR RADIOLOGIST EVAL & MGMT  06/30/2023   IR RADIOLOGIST EVAL & MGMT  09/05/2024   KNEE SURGERY  1990s, 2015, 2016   Torn meniscus L scope 2016, R 2015, L 1991 scope   RADIOFREQUENCY ABLATION Right 05/20/2022   Procedure: RIGHT CRYOABLATION;  Surgeon: Vanice Sharper, MD;  Location: [facility];  Service: Anesthesiology;  Laterality: Right;   SALIVARY GLAND SURGERY  as baby- removal- states tumor   TONSILLECTOMY     Social History:  reports that he has never smoked. He has never used smokeless tobacco. He reports current alcohol use of about 1.0 - 7.0 standard drink of alcohol per week. He reports current drug use. Family History:  Family History  Problem Relation Age of Onset   Stroke Mother    Diabetes Mother    Osteoporosis Mother    Dementia Mother    Heart disease Mother        pacemaker   Hyperlipidemia Father        diet controlled   CAD Father        stents in 3s or 41s   Arthritis Sister     Hyperthyroidism Sister      HOME MEDICATIONS: Allergies as of 11/16/2024   No Known Allergies      Medication List        Accurate as of November 16, 2024 10:48 AM. If you have any questions, ask your nurse or doctor.          atorvastatin  20 MG tablet Commonly known as: LIPITOR TAKE 1 TABLET BY MOUTH ON MONDAYS, WEDNESDAYS AND FRIDAYS   benazepril -hydrochlorthiazide 20-25 MG tablet Commonly known as: LOTENSIN  HCT TAKE 1 TABLET BY MOUTH EVERY DAY   Creon 36000-114000 units Cpep capsule Generic drug: lipase/protease/amylase Take 1-2 capsules by mouth See admin instructions. Take 2 capsule with each meal and 1 capsule with a snack   Feldene 10 MG capsule Generic drug: piroxicam 5 mg 1 capsule with food Orally Once a day   felodipine  5 MG 24 hr tablet Commonly known as: PLENDIL  TAKE 1 TABLET (5 MG TOTAL) BY MOUTH DAILY.   FreeStyle Libre 2 Plus Sensor Misc 1 each by Does not apply route See admin instructions. CHANGE SENSORS EVERY 15 DAYS   Imodium A-D 2 MG tablet Generic drug: loperamide 1-3 tablets Orally in the morning   loratadine 10 MG tablet Commonly known as: CLARITIN Take 10 mg by mouth daily as needed for allergies.   metFORMIN  500 MG 24 hr tablet Commonly known as: GLUCOPHAGE -XR TAKE 2 TABLETS (1,000 MG TOTAL) BY MOUTH 2 (TWO) TIMES DAILY WITH A MEAL.   OVER THE COUNTER MEDICATION Apply 1 Application topically daily as needed (pain). CBD cream   repaglinide  1 MG tablet Commonly known as: PRANDIN  Take 1 tablet (1 mg total) by mouth 3 (three) times daily before meals.   tamsulosin  0.4 MG Caps capsule Commonly known as: FLOMAX  Take 2 capsules (0.8 mg total) by mouth daily.         OBJECTIVE:   Vital Signs: BP 100/60   Pulse 74   Ht 5' 6 (1.676 m)   Wt 181 lb (82.1 kg)   SpO2 97%   BMI 29.21 kg/m   Wt Readings from Last 3 Encounters:  11/16/24 181 lb (82.1 kg)  11/07/24 184 lb (83.5 kg)  07/26/24 180 lb (81.6 kg)      Exam: General: Pt appears well and is in NAD  Lungs: Clear with good BS bilat   Heart: RRR   Extremities: Trace   pretibial edema.   Neuro: MS is good with appropriate affect, pt is alert and Ox3       DM foot exam: 11/16/2024   The skin of the feet is intact without sores or ulcerations. The pedal pulses are 2+ on right and 2+ on left. The sensation is intact to a screening 5.07, 10 gram monofilament  bilaterally   DATA REVIEWED:  Lab Results  Component Value Date   HGBA1C 6.7 (H) 11/07/2024   HGBA1C 7.2 (H) 07/26/2024   HGBA1C 6.4 (A) 05/16/2024    Latest Reference Range & Units 07/26/24 15:48  Sodium 135 - 145 mEq/L 141  Potassium 3.5 - 5.1 mEq/L 4.0  Chloride 96 - 112 mEq/L 106  CO2 19 - 32 mEq/L 28  Glucose 70 - 99 mg/dL 855 (H)  BUN 6 - 23 mg/dL 21  Creatinine 9.59 - 8.49 mg/dL 9.11  Calcium  8.4 - 10.5 mg/dL 9.0  Alkaline Phosphatase 39 - 117 U/L 60  Albumin 3.5 - 5.2 g/dL 4.2  AST 0 - 37 U/L 17  ALT 0 - 53 U/L 16  Total Protein 6.0 - 8.3 g/dL 6.4  Bilirubin, Direct 0.0 - 0.3 mg/dL 0.1  Total Bilirubin 0.2 - 1.2 mg/dL 0.3  GFR >39.99 mL/min 80.45      ASSESSMENT / PLAN / RECOMMENDATIONS:   1) Type 2 Diabetes Mellitus, Optimally controlled, Without complications - Most recent A1c of 6.7%. Goal A1c < 7.0 %.    -A1c is optimal -Patient has been noted with hypoglycemic episode on freestyle guardian life insurance, patient has confirmed these are erroneous  and his BGs are within normal range -I had switched glipizide  to repaglinide  due to hypoglycemia, - Will avoid GLp-1 agonists and DPP-4 inhibitors due to history of pancreatitis - No changes at this time  MEDICATIONS:  Continue repaglinide  1 mg, 1 tablet before breakfast, lunch and supper Continue Metformin  500 mg, 2 tablet before Breakfast and 2 tablet before Supper    EDUCATION / INSTRUCTIONS: BG monitoring instructions: Patient is instructed to check his blood sugars 3 times a day, before meals . Call  [facility] if: BG persistently < 70  I reviewed the Rule of 15 for the treatment of hypoglycemia in detail with the patient. Literature supplied.     2) Diabetic complications:  Eye: Does not have known diabetic retinopathy.  Neuro/ Feet: Does not have known diabetic peripheral neuropathy .  Renal: Patient does not have known baseline CKD. He   is  on an ACEI/ARB at present.      F/U in 6 months      Signed electronically by: Stefano Redgie Butts, MD  [facility]  [facility] [address] [place] [place], [place] [address] Phone: [phone] FAX: [phone]   CC: Walter Garnette LABOR, MD [address] [place] [address] Phone: [phone]  Fax: [phone]  Return to Endocrinology clinic as below: No future appointments.     "

## 2024-11-18 ENCOUNTER — Other Ambulatory Visit: Payer: Self-pay | Admitting: Internal Medicine

## 2024-11-21 ENCOUNTER — Other Ambulatory Visit (HOSPITAL_COMMUNITY): Payer: Self-pay

## 2024-11-21 ENCOUNTER — Other Ambulatory Visit: Payer: Self-pay | Admitting: Family Medicine

## 2024-11-21 ENCOUNTER — Telehealth: Payer: Self-pay

## 2024-11-21 DIAGNOSIS — I10 Essential (primary) hypertension: Secondary | ICD-10-CM

## 2024-11-21 NOTE — Telephone Encounter (Signed)
 Pharmacy Patient Advocate Encounter   Received notification from RX Request Messages that prior authorization for Freestyle libre 2 plus is required/requested.   Insurance verification completed.   The patient is insured through [employer].   Per test claim: Medication is not eligible for pharmacy benefits and must be billed through medical insurance. As our team only handles pharmacy related prior auths, medical PA's must be submitted by the clinic. Thank you

## 2024-11-21 NOTE — Telephone Encounter (Signed)
 Updated order sent to [facility]

## 2024-11-22 ENCOUNTER — Telehealth: Payer: Self-pay

## 2024-11-22 NOTE — Telephone Encounter (Signed)
 Pharmacy sent request stating PA was needed for 2+. Submission to the PA team was returned stating this has to go through patient's medical insurance. After trying to submit on [employer]. It is noted that patient does not meet qualifications. Patient sent mychart message making him aware.

## 2024-11-23 ENCOUNTER — Other Ambulatory Visit: Payer: Self-pay

## 2024-12-11 ENCOUNTER — Other Ambulatory Visit: Payer: Self-pay | Admitting: Family Medicine

## 2025-05-17 ENCOUNTER — Ambulatory Visit: Admitting: Internal Medicine
